# Patient Record
Sex: Female | Born: 1980 | Race: White | Hispanic: No | Marital: Married | Smoking: Current every day smoker
Health system: Southern US, Community
[De-identification: ages and names within clinical notes are randomized; demographics above are authoritative.]

## PROBLEM LIST (undated history)

## (undated) DIAGNOSIS — R87629 Unspecified abnormal cytological findings in specimens from vagina: Secondary | ICD-10-CM

## (undated) DIAGNOSIS — Z22322 Carrier or suspected carrier of Methicillin resistant Staphylococcus aureus: Secondary | ICD-10-CM

## (undated) DIAGNOSIS — J45909 Unspecified asthma, uncomplicated: Secondary | ICD-10-CM

## (undated) DIAGNOSIS — F419 Anxiety disorder, unspecified: Secondary | ICD-10-CM

## (undated) DIAGNOSIS — R569 Unspecified convulsions: Secondary | ICD-10-CM

## (undated) DIAGNOSIS — F32A Depression, unspecified: Secondary | ICD-10-CM

## (undated) DIAGNOSIS — T8859XA Other complications of anesthesia, initial encounter: Secondary | ICD-10-CM

## (undated) DIAGNOSIS — F329 Major depressive disorder, single episode, unspecified: Secondary | ICD-10-CM

## (undated) DIAGNOSIS — G629 Polyneuropathy, unspecified: Secondary | ICD-10-CM

## (undated) DIAGNOSIS — Z9289 Personal history of other medical treatment: Secondary | ICD-10-CM

## (undated) DIAGNOSIS — T4145XA Adverse effect of unspecified anesthetic, initial encounter: Secondary | ICD-10-CM

## (undated) DIAGNOSIS — F319 Bipolar disorder, unspecified: Secondary | ICD-10-CM

## (undated) HISTORY — PX: TONSILLECTOMY: SUR1361

## (undated) HISTORY — DX: Anxiety disorder, unspecified: F41.9

## (undated) HISTORY — PX: TUBAL LIGATION: SHX77

## (undated) HISTORY — PX: ADENOIDECTOMY: SUR15

## (undated) HISTORY — PX: ORIF PELVIC FRACTURE: SHX2128

## (undated) HISTORY — DX: Unspecified abnormal cytological findings in specimens from vagina: R87.629

---
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
## 2002-01-26 ENCOUNTER — Inpatient Hospital Stay (HOSPITAL_COMMUNITY): Admission: AD | Admit: 2002-01-26 | Discharge: 2002-01-28 | Payer: Self-pay | Admitting: Obstetrics

## 2002-02-16 ENCOUNTER — Inpatient Hospital Stay (HOSPITAL_COMMUNITY): Admission: AD | Admit: 2002-02-16 | Discharge: 2002-02-16 | Payer: Self-pay | Admitting: Obstetrics & Gynecology

## 2002-03-19 ENCOUNTER — Ambulatory Visit (HOSPITAL_COMMUNITY): Admission: RE | Admit: 2002-03-19 | Discharge: 2002-03-19 | Payer: Self-pay | Admitting: Obstetrics

## 2002-03-19 ENCOUNTER — Encounter: Payer: Self-pay | Admitting: Obstetrics

## 2002-05-13 ENCOUNTER — Encounter: Payer: Self-pay | Admitting: Obstetrics

## 2002-05-13 ENCOUNTER — Ambulatory Visit (HOSPITAL_COMMUNITY): Admission: RE | Admit: 2002-05-13 | Discharge: 2002-05-13 | Payer: Self-pay | Admitting: Obstetrics

## 2002-06-12 ENCOUNTER — Ambulatory Visit (HOSPITAL_COMMUNITY): Admission: RE | Admit: 2002-06-12 | Discharge: 2002-06-12 | Payer: Self-pay | Admitting: Obstetrics

## 2002-06-12 ENCOUNTER — Encounter: Payer: Self-pay | Admitting: Obstetrics

## 2002-07-03 ENCOUNTER — Inpatient Hospital Stay (HOSPITAL_COMMUNITY): Admission: AD | Admit: 2002-07-03 | Discharge: 2002-07-03 | Payer: Self-pay | Admitting: Obstetrics

## 2002-07-16 ENCOUNTER — Encounter: Payer: Self-pay | Admitting: Obstetrics

## 2002-07-16 ENCOUNTER — Ambulatory Visit (HOSPITAL_COMMUNITY): Admission: RE | Admit: 2002-07-16 | Discharge: 2002-07-16 | Payer: Self-pay | Admitting: Obstetrics

## 2002-07-25 ENCOUNTER — Inpatient Hospital Stay (HOSPITAL_COMMUNITY): Admission: AD | Admit: 2002-07-25 | Discharge: 2002-07-25 | Payer: Self-pay | Admitting: Obstetrics & Gynecology

## 2002-07-26 ENCOUNTER — Inpatient Hospital Stay (HOSPITAL_COMMUNITY): Admission: AD | Admit: 2002-07-26 | Discharge: 2002-07-26 | Payer: Self-pay | Admitting: Obstetrics

## 2002-08-03 ENCOUNTER — Encounter (INDEPENDENT_AMBULATORY_CARE_PROVIDER_SITE_OTHER): Payer: Self-pay

## 2002-08-03 ENCOUNTER — Inpatient Hospital Stay (HOSPITAL_COMMUNITY): Admission: AD | Admit: 2002-08-03 | Discharge: 2002-08-06 | Payer: Self-pay | Admitting: Obstetrics

## 2005-05-13 ENCOUNTER — Emergency Department (HOSPITAL_COMMUNITY): Admission: EM | Admit: 2005-05-13 | Discharge: 2005-05-13 | Payer: Self-pay | Admitting: Family Medicine

## 2005-09-28 ENCOUNTER — Inpatient Hospital Stay (HOSPITAL_COMMUNITY): Admission: AD | Admit: 2005-09-28 | Discharge: 2005-09-28 | Payer: Self-pay | Admitting: Obstetrics & Gynecology

## 2005-09-30 ENCOUNTER — Inpatient Hospital Stay (HOSPITAL_COMMUNITY): Admission: AD | Admit: 2005-09-30 | Discharge: 2005-10-07 | Payer: Self-pay | Admitting: Obstetrics & Gynecology

## 2005-10-16 ENCOUNTER — Inpatient Hospital Stay (HOSPITAL_COMMUNITY): Admission: AD | Admit: 2005-10-16 | Discharge: 2005-10-16 | Payer: Self-pay | Admitting: Maternal and Fetal Medicine

## 2005-10-17 ENCOUNTER — Inpatient Hospital Stay (HOSPITAL_COMMUNITY): Admission: AD | Admit: 2005-10-17 | Discharge: 2005-10-17 | Payer: Self-pay | Admitting: Obstetrics & Gynecology

## 2005-12-01 ENCOUNTER — Inpatient Hospital Stay (HOSPITAL_COMMUNITY): Admission: AD | Admit: 2005-12-01 | Discharge: 2005-12-02 | Payer: Self-pay | Admitting: Obstetrics & Gynecology

## 2006-01-04 ENCOUNTER — Ambulatory Visit (HOSPITAL_COMMUNITY): Admission: RE | Admit: 2006-01-04 | Discharge: 2006-01-04 | Payer: Self-pay | Admitting: Obstetrics

## 2006-02-08 ENCOUNTER — Inpatient Hospital Stay (HOSPITAL_COMMUNITY): Admission: AD | Admit: 2006-02-08 | Discharge: 2006-02-08 | Payer: Self-pay | Admitting: Obstetrics

## 2006-02-12 ENCOUNTER — Inpatient Hospital Stay (HOSPITAL_COMMUNITY): Admission: AD | Admit: 2006-02-12 | Discharge: 2006-02-23 | Payer: Self-pay | Admitting: Obstetrics

## 2006-02-13 ENCOUNTER — Ambulatory Visit: Payer: Self-pay | Admitting: Neonatology

## 2006-02-23 ENCOUNTER — Inpatient Hospital Stay (HOSPITAL_COMMUNITY): Admission: AD | Admit: 2006-02-23 | Discharge: 2006-02-23 | Payer: Self-pay | Admitting: Obstetrics

## 2006-02-25 ENCOUNTER — Inpatient Hospital Stay (HOSPITAL_COMMUNITY): Admission: AD | Admit: 2006-02-25 | Discharge: 2006-02-27 | Payer: Self-pay | Admitting: Obstetrics

## 2006-04-12 ENCOUNTER — Ambulatory Visit (HOSPITAL_COMMUNITY): Admission: RE | Admit: 2006-04-12 | Discharge: 2006-04-12 | Payer: Self-pay | Admitting: Obstetrics

## 2006-04-21 ENCOUNTER — Inpatient Hospital Stay (HOSPITAL_COMMUNITY): Admission: AD | Admit: 2006-04-21 | Discharge: 2006-04-21 | Payer: Self-pay | Admitting: Obstetrics

## 2006-04-27 ENCOUNTER — Inpatient Hospital Stay (HOSPITAL_COMMUNITY): Admission: AD | Admit: 2006-04-27 | Discharge: 2006-04-27 | Payer: Self-pay | Admitting: Obstetrics

## 2006-04-29 ENCOUNTER — Inpatient Hospital Stay (HOSPITAL_COMMUNITY): Admission: AD | Admit: 2006-04-29 | Discharge: 2006-05-02 | Payer: Self-pay | Admitting: Obstetrics

## 2006-05-01 ENCOUNTER — Encounter (INDEPENDENT_AMBULATORY_CARE_PROVIDER_SITE_OTHER): Payer: Self-pay | Admitting: Specialist

## 2006-08-05 ENCOUNTER — Emergency Department (HOSPITAL_COMMUNITY): Admission: EM | Admit: 2006-08-05 | Discharge: 2006-08-05 | Payer: Self-pay | Admitting: Family Medicine

## 2006-12-12 ENCOUNTER — Inpatient Hospital Stay (HOSPITAL_COMMUNITY): Admission: AD | Admit: 2006-12-12 | Discharge: 2006-12-12 | Payer: Self-pay | Admitting: Obstetrics & Gynecology

## 2006-12-19 ENCOUNTER — Emergency Department (HOSPITAL_COMMUNITY): Admission: EM | Admit: 2006-12-19 | Discharge: 2006-12-19 | Payer: Self-pay | Admitting: Emergency Medicine

## 2007-03-13 ENCOUNTER — Emergency Department (HOSPITAL_COMMUNITY): Admission: EM | Admit: 2007-03-13 | Discharge: 2007-03-13 | Payer: Self-pay | Admitting: Family Medicine

## 2007-03-26 ENCOUNTER — Inpatient Hospital Stay (HOSPITAL_COMMUNITY): Admission: AD | Admit: 2007-03-26 | Discharge: 2007-03-26 | Payer: Self-pay | Admitting: Obstetrics & Gynecology

## 2007-04-24 ENCOUNTER — Emergency Department (HOSPITAL_COMMUNITY): Admission: EM | Admit: 2007-04-24 | Discharge: 2007-04-24 | Payer: Self-pay | Admitting: Emergency Medicine

## 2007-07-14 ENCOUNTER — Emergency Department (HOSPITAL_COMMUNITY): Admission: EM | Admit: 2007-07-14 | Discharge: 2007-07-15 | Payer: Self-pay | Admitting: Emergency Medicine

## 2007-08-15 ENCOUNTER — Emergency Department (HOSPITAL_COMMUNITY): Admission: EM | Admit: 2007-08-15 | Discharge: 2007-08-15 | Payer: Self-pay | Admitting: Emergency Medicine

## 2008-02-10 ENCOUNTER — Emergency Department (HOSPITAL_COMMUNITY): Admission: EM | Admit: 2008-02-10 | Discharge: 2008-02-10 | Payer: Self-pay | Admitting: Emergency Medicine

## 2008-02-14 ENCOUNTER — Emergency Department (HOSPITAL_COMMUNITY): Admission: EM | Admit: 2008-02-14 | Discharge: 2008-02-14 | Payer: Self-pay | Admitting: Emergency Medicine

## 2008-02-24 ENCOUNTER — Emergency Department (HOSPITAL_COMMUNITY): Admission: EM | Admit: 2008-02-24 | Discharge: 2008-02-24 | Payer: Self-pay | Admitting: Emergency Medicine

## 2008-03-05 ENCOUNTER — Emergency Department (HOSPITAL_COMMUNITY): Admission: EM | Admit: 2008-03-05 | Discharge: 2008-03-05 | Payer: Self-pay | Admitting: Emergency Medicine

## 2008-12-07 ENCOUNTER — Emergency Department (HOSPITAL_COMMUNITY): Admission: EM | Admit: 2008-12-07 | Discharge: 2008-12-07 | Payer: Self-pay | Admitting: Emergency Medicine

## 2009-04-29 ENCOUNTER — Emergency Department (HOSPITAL_COMMUNITY): Admission: EM | Admit: 2009-04-29 | Discharge: 2009-04-29 | Payer: Self-pay | Admitting: Emergency Medicine

## 2010-01-16 ENCOUNTER — Emergency Department (HOSPITAL_COMMUNITY): Admission: EM | Admit: 2010-01-16 | Discharge: 2010-01-17 | Payer: Self-pay | Admitting: Emergency Medicine

## 2010-01-16 ENCOUNTER — Emergency Department (HOSPITAL_COMMUNITY): Admission: EM | Admit: 2010-01-16 | Discharge: 2010-01-16 | Payer: Self-pay | Admitting: Family Medicine

## 2010-03-29 ENCOUNTER — Emergency Department (HOSPITAL_COMMUNITY)
Admission: EM | Admit: 2010-03-29 | Discharge: 2010-03-29 | Payer: Self-pay | Source: Home / Self Care | Admitting: Emergency Medicine

## 2010-04-27 ENCOUNTER — Emergency Department (HOSPITAL_COMMUNITY)
Admission: EM | Admit: 2010-04-27 | Discharge: 2010-04-27 | Payer: Medicaid Other | Source: Home / Self Care | Admitting: Family Medicine

## 2010-07-06 LAB — CBC
MCH: 29.9 pg (ref 26.0–34.0)
MCV: 90.6 fL (ref 78.0–100.0)
Platelets: 258 10*3/uL (ref 150–400)
RBC: 4.25 MIL/uL (ref 3.87–5.11)
RDW: 13.1 % (ref 11.5–15.5)

## 2010-07-06 LAB — BASIC METABOLIC PANEL
BUN: 8 mg/dL (ref 6–23)
CO2: 28 mEq/L (ref 19–32)
Calcium: 8.4 mg/dL (ref 8.4–10.5)
Chloride: 107 mEq/L (ref 96–112)
Creatinine, Ser: 0.62 mg/dL (ref 0.4–1.2)
GFR calc Af Amer: >60 mL/min (ref >60)

## 2010-07-06 LAB — DIFFERENTIAL
Basophils Absolute: 0 10*3/uL (ref 0.0–0.1)
Basophils Relative: 0 % (ref 0–1)
Eosinophils Absolute: 0.3 10*3/uL (ref 0.0–0.7)
Eosinophils Relative: 3 % (ref 0–5)
Lymphs Abs: 2.1 10*3/uL (ref 0.7–4.0)
Neutrophils Relative %: 64 % (ref 43–77)

## 2010-07-06 LAB — HEMOCCULT GUIAC POC 1CARD (OFFICE): Fecal Occult Bld: POSITIVE

## 2010-07-08 LAB — COMPREHENSIVE METABOLIC PANEL
AST: 20 U/L (ref 0–37)
Albumin: 3.6 g/dL (ref 3.5–5.2)
Alkaline Phosphatase: 37 U/L — ABNORMAL LOW (ref 39–117)
BUN: 12 mg/dL (ref 6–23)
Chloride: 106 mEq/L (ref 96–112)
GFR calc Af Amer: >60 mL/min (ref >60)
Potassium: 4.4 mEq/L (ref 3.5–5.1)
Total Protein: 6.7 g/dL (ref 6.0–8.3)

## 2010-07-08 LAB — POCT URINALYSIS DIPSTICK
Bilirubin Urine: NEGATIVE
Glucose, UA: NEGATIVE mg/dL
Hgb urine dipstick: NEGATIVE
Nitrite: NEGATIVE
Urobilinogen, UA: 1 mg/dL (ref 0.0–1.0)

## 2010-07-08 LAB — POCT PREGNANCY, URINE: Preg Test, Ur: NEGATIVE

## 2010-07-08 LAB — CBC
MCV: 89.5 fL (ref 78.0–100.0)
Platelets: 196 10*3/uL (ref 150–400)
RBC: 4.1 MIL/uL (ref 3.87–5.11)
RDW: 13.5 % (ref 11.5–15.5)
WBC: 9.6 10*3/uL (ref 4.0–10.5)

## 2010-07-08 LAB — DIFFERENTIAL
Basophils Absolute: 0 10*3/uL (ref 0.0–0.1)
Basophils Relative: 0 % (ref 0–1)
Lymphocytes Relative: 25 % (ref 12–46)
Monocytes Relative: 6 % (ref 3–12)
Neutro Abs: 6.2 10*3/uL (ref 1.7–7.7)
Neutrophils Relative %: 65 % (ref 43–77)

## 2010-07-11 LAB — URINALYSIS, ROUTINE W REFLEX MICROSCOPIC
Nitrite: NEGATIVE
Specific Gravity, Urine: 1.034 — ABNORMAL HIGH (ref 1.005–1.030)
Urobilinogen, UA: 1 mg/dL (ref 0.0–1.0)
pH: 5.5 (ref 5.0–8.0)

## 2010-07-11 LAB — DIFFERENTIAL
Eosinophils Relative: 1 % (ref 0–5)
Lymphocytes Relative: 10 % — ABNORMAL LOW (ref 12–46)
Lymphs Abs: 0.6 10*3/uL — ABNORMAL LOW (ref 0.7–4.0)
Monocytes Absolute: 0.7 10*3/uL (ref 0.1–1.0)
Monocytes Relative: 12 % (ref 3–12)

## 2010-07-11 LAB — WET PREP, GENITAL: WBC, Wet Prep HPF POC: NONE SEEN

## 2010-07-11 LAB — URINE MICROSCOPIC-ADD ON

## 2010-07-11 LAB — POCT I-STAT, CHEM 8
BUN: 22 mg/dL (ref 6–23)
Chloride: 106 mEq/L (ref 96–112)
Creatinine, Ser: 0.8 mg/dL (ref 0.4–1.2)
Hemoglobin: 13.3 g/dL (ref 12.0–15.0)
Potassium: 3.6 mEq/L (ref 3.5–5.1)
Sodium: 139 mEq/L (ref 135–145)

## 2010-07-11 LAB — CBC
HCT: 35.5 % — ABNORMAL LOW (ref 36.0–46.0)
Hemoglobin: 12.3 g/dL (ref 12.0–15.0)
WBC: 6 10*3/uL (ref 4.0–10.5)

## 2010-09-10 NOTE — Discharge Summary (Signed)
NAMEBROOKSIE, ELLWANGER                  ACCOUNT NO.:  [id number]   MEDICAL RECORD NO.:  [id number]          PATIENT TYPE:  INP   LOCATION:  9304                          [facility]  [facility]   PHYSICIAN:  Charles A. Clearance Coots, M.D.DATE OF BIRTH:  07/29/1980   DATE OF ADMISSION:  09/30/2005  DATE OF DISCHARGE:  10/07/2005                                 DISCHARGE SUMMARY   ADMITTING DIAGNOSES:  1. [redacted] weeks gestation.  2. Nausea and vomiting.   DISCHARGE DIAGNOSES:  1. [redacted] weeks gestation.  2. Nausea and vomiting.  3. Discharged home undelivered at approximately [redacted] weeks gestation much      improved.   REASON FOR ADMISSION:  A 30 year old white female para 2 with intrauterine  pregnancy at [redacted] weeks gestation presents complaining of nausea and vomiting.  The patient had presented to the maternity admissions unit several days  prior to admission with similar complaints.  She has a history of pregnancy-  related nausea and vomiting with her prior pregnancies.  The nausea and  vomited had respond in the past to Zofran.   PAST MEDICAL HISTORY:   SURGERY:  Tonsillectomy/adenoidectomy.   ILLNESSES:  None.   MEDICATIONS:  Tylenol.   ALLERGIES:  PENICILLIN and MACROLIDES.   SOCIAL HISTORY:  Negative for tobacco, alcohol, or recreational drug use.  Obstetrical risk factors:  She has a history of preterm delivery and  pregnancy-related nausea and vomiting.   PAST OBSTETRICAL HISTORY:  Status post two spontaneous vaginal deliveries.   PHYSICAL EXAMINATION:  GENERAL:  Slim white female in no acute distress.  VITAL SIGNS:  Temperature 98.4, pulse 72, respirations 20, blood pressure  113/69.  LUNGS:  Clear to auscultation bilaterally.  HEART:  Regular rate and rhythm.  ABDOMEN:  Soft, nontender.  PELVIC:  Deferred.   LABORATORY VALUES:  Urinalysis:  Specific gravity 1.030, cloudy, 40 of  ketones, small bilirubin.  Electrolytes normal.  Hemoglobin 12.6, white  blood cell count 9600.   IMPRESSION:   [redacted] weeks gestation with pregnancy-related nausea and vomiting.  No significant metabolic disturbance.   PLAN:  Admit, IV hydration and anti-emetic therapy.   HOSPITAL COURSE:  Patient was admitted and started on IV fluids and anti-  emetic therapy with Zofran and Reglan.  She responded well to therapy after  about three days of therapy and was able to eat after day three and the  response was improved with a steroid taper.  She was discharged home on  hospital day #7 able to tolerate a diet.   DISCHARGE LABORATORY VALUES:  Hemoglobin 11.3, hematocrit 32, white blood  cell count 7000, platelets 216,000.  Comprehensive metabolic panel was  within normal limits.   DISCHARGE DISPOSITION:   MEDICATIONS:  Continue Zofran and Reglan and a Medrol Dosepak.  Patient is  to call office for a follow-up appointment in one week.  Patient was given  instructions for nausea and vomiting in pregnancy.      Charles A. Clearance Coots, M.D.  Electronically Signed     CAH/MEDQ  D:  11/28/2005  T:  11/28/2005  Job:  [id number]

## 2010-09-10 NOTE — Discharge Summary (Signed)
   NAME:  [name], [name]                          ACCOUNT NO.:  [id number]   MEDICAL RECORD NO.:  [id number]                   PATIENT TYPE:  INP   LOCATION:  9326                                 [facility]   PHYSICIAN:  Roseanna Rainbow, M.D.         DATE OF BIRTH:  [birth year]   DATE OF ADMISSION:  01/25/2002  DATE OF DISCHARGE:  01/28/2002                                 DISCHARGE SUMMARY   CHIEF COMPLAINT:  The patient is a 30 year old gravida 2 para 1, last  menstrual period September 24, 2001 who complains of severe headache, nausea and  vomiting, and back pain.   HISTORY OF PRESENT ILLNESS:  As above.  The patient has a history of  migraine headaches.   ALLERGIES:  AUGMENTIN and ERYTHROMYCIN.   MEDICATIONS:  Albuterol as needed.   PAST SURGICAL HISTORY:  1. Tonsil and adenoid.  2. Left ear surgery.   PAST OBSTETRICAL AND GYNECOLOGICAL HISTORY:  She is status post a  spontaneous vaginal delivery.   PAST MEDICAL HISTORY:  Asthma.   PHYSICAL EXAMINATION:  VITAL SIGNS:  Temperature 99.2, pulse 108,  respiratory rate 20, blood pressure 117/62.  GENERAL APPEARANCE:  Well-developed, well-nourished, in no apparent  distress.  HEENT:  Normocephalic, atraumatic.  NECK:  Supple.  No adenopathy.  LUNGS:  Clear to auscultation bilaterally.  HEART:  Regular rate and rhythm.  ABDOMEN:  Soft, nontender.  PELVIC:  Deferred.  EXTREMITIES:  No clubbing, cyanosis, or edema.  SKIN:  Without rash.   ASSESSMENT:  Intrauterine pregnancy at 17+ weeks with migraine headache,  unable to tolerate p.o.   PLAN:  Admission and supportive care.   HOSPITAL COURSE:  The patient was admitted, received intravenous hydration,  antiemetics, as well as analgesics for her headache.  Her headache improved  and she was discharged to home tolerating a regular diet.   DISCHARGE DIAGNOSES:  1. Possible viral syndrome.  2. Migraine headache, acute exacerbation.  3. Intrauterine pregnancy at 17+  weeks.   CONDITION:  Good.   DIET:  Regular.   ACTIVITY:  Ad lib.   MEDICATIONS:  1. Resume home medications.  2. Percocet one to two tablets p.o. q.4-6h. as needed.   DISPOSITION:  The patient was to call the office for a follow-up appointment  with Dr. Clearance Coots.                                               Roseanna Rainbow, M.D.    Judee Clara  D:  03/08/2002  T:  03/08/2002  Job:  [id number]

## 2010-09-10 NOTE — H&P (Signed)
[name], [name]                  ACCOUNT NO.:  [id number]   MEDICAL RECORD NO.:  [id number]          PATIENT TYPE:  INP   LOCATION:  9305                          [facility]   PHYSICIAN:  Roseanna Rainbow, M.D.DATE OF BIRTH:  [birth year]   DATE OF ADMISSION:  12/01/2005  DATE OF DISCHARGE:                                HISTORY & PHYSICAL   CHIEF COMPLAINT:  The patient is a 30 year old gravida 3, para 2 with an  estimated date of confinement by ultrasound of May 24, 2006 with an  intrauterine pregnancy at 15 weeks, complaining of nausea and vomiting.   HISTORY OF PRESENT ILLNESS:  The patient has a history of pregnancy-related  nausea and vomiting.   ALLERGIES:  SUDAFED AND MACROLIDE ANTIBIOTICS.   MEDICATIONS:  Zofran.   CURRENT OBSTETRICAL RISK FACTORS:  Pregnancy-related nausea and vomiting.  Asthma, depression.   PAST GYNECOLOGICAL HISTORY:  Noncontributory.   PAST MEDICAL HISTORY:  1. Asthma.  2. Depression.  3. Chronic urinary tract infections.   PAST SURGICAL HISTORY:  Tonsils and adenoids.   SOCIAL HISTORY:  She is unemployed, single. Does not give any significant  history of alcohol usage. Has no significant smoking history. Denies illicit  drug use.   FAMILY HISTORY:  Breast cancer, lung cancer, pancreatic cancer, diabetes  mellitus.   PAST OBSTETRICAL HISTORY:  In 2004, she was delivered of a female 8 pounds 9  ounces, vaginal delivery. In 2003, she was delivered of a female post dates, 8  pounds, vaginal delivery.   PHYSICAL EXAMINATION:  VITAL SIGNS:  Stable. Afebrile. Fetal heart tones by  Doppler okay.  GENERAL:  No apparent distress.  ABDOMEN:  Gravid, nontender.  PELVIC:  Deferred.   STUDIES:  Urinalysis remarkable for greater than 80 ketones.   ASSESSMENT:  1. Hyperemesis gravidarum.  2. Intrauterine pregnancy at 15+ weeks.  3. Dehydration.   PLAN:  Admission. Supportive care including antiemetics. IV hydration.  Serial lab work.  Nutrition consult. Bed rest.      Roseanna Rainbow, M.D.  Electronically Signed     LAJ/MEDQ  D:  12/02/2005  T:  12/02/2005  Job:  [id number]

## 2010-09-10 NOTE — Discharge Summary (Signed)
[name], [name]                  ACCOUNT NO.:  [id number]   MEDICAL RECORD NO.:  [id number]          PATIENT TYPE:  INP   LOCATION:  9159                          [facility]   PHYSICIAN:  Roseanna Rainbow, M.D.DATE OF BIRTH:  1981/02/11   DATE OF ADMISSION:  02/25/2006  DATE OF DISCHARGE:  02/27/2006                                 DISCHARGE SUMMARY   CHIEF COMPLAINT:  The patient is a 30 year old gravida 3, para 2 Caucasian  female with an estimated date of confinement of January30,2008 who presents  complaining of nausea and myalgias.   HISTORY OF PRESENT ILLNESS:  Please see the above.  She was recently  discharged to home from a prior hospitalization on November1, 2007.   PAST SURGICAL HISTORY:  1. Tonsils and adenoids.  2. Ear surgery.   PAST MEDICAL HISTORY:  Depression.   MEDICATIONS:  Prenatal vitamins, Wellbutrin, Zofran.   ALLERGIES:  1. ERYTHROMYCIN.  2. ZITHROMAX.  3. SUDAFED.   SOCIAL HISTORY:  She denies any tobacco, ethanol or drug use.   PHYSICAL EXAM:  VITAL SIGNS:  Stable, afebrile, blood pressure 138/88.  LUNGS:  Clear to auscultation.  HEART:  Regular rate and rhythm.  ABDOMEN:  Gravid, nontender.  PELVIC EXAM:  Deferred.  Fetal heart tracing reassuring.  Tocodynamometer  with occasional uterine contractions.   LABS:  Remarkable for potassium of 2.9.  ALT and AST 47 and 30 respectively.  Urinalysis with a specific gravity of 10/20.  White blood cell count 7000,  hemoglobin 10, platelets 520,000.   ASSESSMENT:  1. Intrauterine pregnancy at 27+ weeks.  2. Viral syndrome.  3. Hypokalemia.   PLAN:  Admission, replete potassium, nutrition consult.   HOSPITAL COURSE:  The patient was admitted.  Dr. Abbey Chatters was consulted  for possible acute cholecystitis.  A CT scan was recommended.  The patient's  abdominal pain and flank pain, however, gradually improved.  On November4,  2007, her potassium was 3.6 and her SGOT and SGPT were stable at  32 and 25.  A white blood cell count again was 7000; a hemoglobin was 9.  She was given  magnesium sulfate parenterally for possible pregnancy-induced hypertension.  However, her blood pressures remained in the normotensive range.  The  magnesium sulfate was subsequently discontinued.  It was also felt that she  had a migraine headache.  On November3, 2007, Dr. Ander Slade from the [facility] was consulted as well.   ASSESSMENT:  1. Viral syndrome versus hyperemesis gravidarum with metabolic      disturbance.  2. Myalgias associated with a viral syndrome.  3. Migraine headache, resolved.   CONDITION:  Stable.   DISPOSITION:  The patient was to follow up in the office in one week.   MEDICATIONS:  Resume home medications.      Roseanna Rainbow, M.D.  Electronically Signed     LAJ/MEDQ  D:  03/12/2006  T:  03/12/2006  Job:  [id number]   cc:   Toma Copier, MD   Adolph Pollack, M.D.  [address], [address]  [place]  [place] [address]

## 2010-09-10 NOTE — Consult Note (Signed)
NAMEMARGARETH, [name] Townsend                  ACCOUNT NO.:  [id number]   MEDICAL RECORD NO.:  [id number]          PATIENT TYPE:  INP   LOCATION:  9171                          [facility]   PHYSICIAN:  Adolph Pollack, M.D.DATE OF BIRTH:  07-27-1980   DATE OF CONSULTATION:  DATE OF DISCHARGE:                                   CONSULTATION   REASON:  Abdominal pain, nausea, vomiting.   HISTORY OF PRESENT ILLNESS:  This is a 30 year old female who was recently  discharged.  She had had diffuse abdominal pain, nausea, vomiting, fever,  and chills and had been in the hospital for some time.  She underwent an  abdominal ultrasound on October 23rd demonstrating minimal gallbladder wall  thickening with sludge and normal common bile duct.  This was not consistent  with acute cholecystitis.  She was sent home and readmitted fairly soon  after discharge with what she describes as severe, sharp, and twisting  abdominal pain.  Sometimes it starts in the back and radiates around the  left side.  Other times it may start in the lower abdomen or left side and  radiate to the back.  She subsequently underwent a repeat abdominal  ultrasound.  Apparently, there is a sonographic Murphy sign at the time and  the findings were suspicious for a possible acute cholecystitis.  She had  sludge and, again, minimal gallbladder wall thickening, essentially the same  ultrasound as she had before.  It was for this reason that I was asked to  see her.   PAST MEDICAL HISTORY:  1. Hyperemesis gravidarum with previous 2 pregnancies.  2. Constipation.  3. Asthma.   Her previous abdominal operations, none.   ALLERGIES:  1. ERYTHROMYCIN.  2. ZITHROMAX.  3. SUDAFED.   CURRENT MEDICATIONS:  Include:  1. Milk of Magnesia.  2. Colace.  3. Flexeril.  4. IV analgesics.   SOCIAL HISTORY:  This is her third pregnancy.  She is single.  Denies  tobacco or alcohol use.   REVIEW OF SYSTEMS:  CONSTITUTIONAL:  Generally,  no fever or chills with this  acute episode.  GI:  She has had no diarrhea and has problems with  constipation with her last bowel movement being 2 days ago.  GU:  No  dysuria.   PHYSICAL EXAM:  GENERAL:  A well-developed, well-nourished female.  She is  in no acute distress.  Her temperature is 98.9. Blood pressure is 127/89.  Pulse of 108.  Respiratory rate 18.  EYES:  Extraocular motion is intact.  No icterus.  RESPIRATORY:  The breath sounds are equal and clear. Respirations unlabored.  CARDIOVASCULAR:  Heart tones demonstrate a slightly increased rate with a  regular rhythm.  No JVD.  ABDOMEN:  Soft and gravid with a palpable uterus above the umbilicus.  There  is no significant right upper quadrant tenderness.  No guarding.  No  Murphy's sign.  No right lower quadrant tenderness.  There is a moderate  amount of left mid abdominal tenderness and a very epigastric tenderness.  Hypoactive bowel sounds noted.  No  CVA tenderness.   LABORATORY DATA:  White blood cell count is 7100 with no left shift.  Amylase, lipase normal.  Total bilirubin slightly elevated at 1.3.  SGOT is  slightly elevated at 47.  Ultrasounds from today and the 23rd were reviewed  with Dr. Molli Posey.   IMPRESSION:  Abdominal and back pain with nausea and vomiting - clinical  history and physical examination are not consistent with acute  cholecystitis.  The ultrasound is essentially equivocal and not very  convincing either.   PLAN:  I agree with the recommendation by the perinatologist, Dr. Ander Slade, that  they could obtain a CT scan.  I currently see no indication for a general  surgical procedure.  If anything shows up on the CT scan, we would be glad  to see her again in the future.      Adolph Pollack, M.D.  Electronically Signed     TJR/MEDQ  D:  02/25/2006  T:  02/26/2006  Job:  [id number]   cc:   Leonette Most A. Clearance Coots, M.D.  Fax: [phone]

## 2010-09-10 NOTE — Op Note (Signed)
[place]. [facility]  Patient:    [name], [name]                         MRN: [id number] Proc. Date: 11/24/99 Adm. Date:  [id number] Disc. Date: [id number] Attending:  Aundria Mems                           Operative Report  PREOPERATIVE DIAGNOSES: 1. Foreign body left middle ear space. 2. Hyperplastic chronic adenotonsillitis.  OPERATIVE PROCEDURE: 1. Removal of foreign body left middle ear space. 2. Adenotonsillectomy.  POSTOPERATIVE DIAGNOSES: 1. Foreign body left middle ear space. 2. Hyperplastic chronic adenotonsillitis.  DESCRIPTION OF PROCEDURE:  With the patient under general orotracheal anesthesia and visualization through the operating microscope, the left ear was inspected.  An old ventilating tube was visible in the middle ear space behind the anterior superior aspect of the tympanic membrane.  A radial incision of the tympanic membrane overlying the tube in the middle ear space was made, and the tube was grasped with forceps and readily removed through the incision in the tympanic membrane which was left to heal spontaneously.  Crowe-Davis mouthgag was then inserted, and the patient put in the [place] position.  Examination showed the patient to have 2 to 3+ enlarged tonsils, but appeared to be extensively enlarged into the fossae bilaterally.  There was large amounts of debris on the surface of the tonsils, but no acute inflammation.  Red rubber catheter was passed through the left nasal chamber, having noted that the soft palate was normal in appearance, and the hard palate was normal to palpation.  Both tonsils likewise were nonpulsitile to palpation.  Nasopharyngeal mirror examination revealed prominent adenoid tissue coated with debris, as well as the presence of a very tenatious thick mucousy secretions trapped in the lateral margins between the tonsils and lateral nasopharyngeal wall bilaterally.  It came away tenatiously  with suction.  The adenoids were then removed by curretage and packs were placed for hemostasis.  The left tonsil was grasped at the superior pole and removed by electrical dissection.  Hemostasis kept complete with electrocauterization.  The right tonsil was removed in a similar fashion, noting at above that both tonsils were deeply enlarged and invasive into the tonsillar fossae bilaterally.  There was prominent lingual tissue as well.  Packs were removed from nasopharynx, and under mirror visualization with suction cautery, complete ablation of the remaining adenoid tissue as well as obtaining complete hemostasis of the nasopharyngeal adenoidectomy site was completed.  ESTIMATED BLOOD LOSS:  Approximately 100 cc.  The patient tolerated the procedure well, was taken to the recovery room in stable general condition. DD:  11/24/99 TD:  11/25/99 Job: [id number]

## 2010-09-10 NOTE — Discharge Summary (Signed)
   NAME:  [name], [name]                          ACCOUNT NO.:  [id number]   MEDICAL RECORD NO.:  [id number]                   PATIENT TYPE:   LOCATION:                                       [facility]   PHYSICIAN:  Roseanna Rainbow, M.D.         DATE OF BIRTH:  12/05/1980   DATE OF ADMISSION:  12/20/2001  DATE OF DISCHARGE:  12/22/2001                                 DISCHARGE SUMMARY   CHIEF COMPLAINT:  The patient is a 30 year old gravida 2 para 1 with nausea  and vomiting.   HISTORY OF PRESENT ILLNESS:  The patient describes several weeks of nausea  and vomiting.  She has not been able to tolerate any solid p.o. intake for  one week.  She has a history of hyperemesis with a previous pregnancy  requiring Zofran.   ALLERGIES:  ZITHROMAX, ERYTHROMYCIN, SUDAFED, and AUGMENTIN.   MEDICATIONS:  Phenergan suppositories.   PAST SURGICAL HISTORY:  Myringotomy, T&A, and left ear surgery.   PAST MEDICAL HISTORY:  Asthma and depression.   PAST OBSTETRICAL AND GYNECOLOGICAL HISTORY:  As above.  She is status post  one NSVD.   FAMILY HISTORY:  Noncontributory.   PHYSICAL EXAMINATION:  VITAL SIGNS:  Stable, afebrile.  GENERAL APPEARANCE:  Well-nourished, well-developed female.  HEENT:  Normocephalic, atraumatic.  NECK:  Supple.  LUNGS:  Clear to auscultation bilaterally.  HEART:  Regular rate and rhythm.  ABDOMEN:  Soft, nontender.  PELVIC:  Deferred.  EXTREMITIES:  No clubbing, cyanosis, or edema.  SKIN:  Without rash.   LABORATORY DATA:  Ultrasound from December 18, 2001 demonstrated a six week  six day intrauterine pregnancy with an [facility] of August 07, 2002.   IMPRESSION:  Seven weeks one day intrauterine pregnancy with nausea and  vomiting of pregnancy.   PLAN:  Admission, intravenous hydration, antiemetics, supportive care.   HOSPITAL COURSE:  The patient was admitted and was started on Zofran and was  given intravenous hydration as well as multivitamins.  Her nausea  improved  and her diet was gradually advanced.  An H. pylori antibody test was  equivocal.  She was able to tolerate a regular diet.   DISCHARGE DIAGNOSIS:  Nausea and vomiting of pregnancy.   CONDITION:  Stable.   DIET:  Regular.   ACTIVITY:  Ad lib.   MEDICATIONS:  1. Reglan 10 mg p.o. t.i.d.  2. Unisom q.h.s.   DISPOSITION:  The patient was to call the office to arrange follow-up with  Dr. Clearance Coots.                                               Roseanna Rainbow, M.D.    [name]  D:  03/08/2002  T:  03/08/2002  Job:  [id number]

## 2010-09-10 NOTE — H&P (Signed)
[facility]  Patient:    [name], [name]                           MRN: [id number] Adm. Date:  03/24/00 Attending:  Jasmine Pang, M.D.                   Psychiatric Admission Assessment  PATIENT IDENTIFICATION:  This is a 30 year old Caucasian female from [place] who is currently in 12th grade at [employer].  HISTORY OF PRESENT ILLNESS:  Patient has had a history of escalating depression and anxiety since school has begun.  She suffers from school anxiety which worsened last year after she had to be out for strep throat and a resulting tonsillectomy and adenoidectomy.  This year when restarting school, her mood decompensated with feelings of anxiety and multiple neurovegetative symptoms.  She has had difficulty sleeping, anergia, anhedonia, difficulty concentrating, feelings of low self-esteem and worthlessness and suicidal ideation.  She states she has been thinking of harming herself for the past two weeks because she has felt so worthless.  On the day of admission, she took an overdose of several medications and was stabilized in the emergency room and then referred to our unit.  PAST PSYCHIATRIC HISTORY:  Patient has been in counseling several times with somebody in the [employer].  She states she stopped going because "it made me feel worse -- like I had more problems."  Her family physician tried her on Prozac, Zoloft and Paxil but she was unable to tolerate these due to side effects.  She never was able to stay on any medication for long enough to achieve a therapeutic effect.  SUBSTANCE ABUSE HISTORY:  None.  PAST MEDICAL HISTORY:  Recent bronchitis treated with antibiotic.  She also has a history of strep throat and the tonsillectomy and adenoidectomy one year ago.  ALLERGIES:  Patient is allergic to [facility], AUGMENTIN and SUDAFED.  CURRENT MEDICATIONS:  Patient is currently on an antibiotic.  Her mother  is going to bring the name of this to the hospital.  She is also on Alupent inhaler.  SOCIAL HISTORY:  Patient lives with her mother and father.  She has a 7 year old brother who is [place]. She has a boyfriend and states he is supportive.  She is in the 12th grade and makes good grades.  She suffered from school anxiety for several years.  There has been no history of physical or sexual abuse.  FAMILY HISTORY:  There is a family psychiatric history of anxiety and depression.  LEGAL:  There is no history of legal problems.  ADMISSION DIAGNOSES: Axis I:    Major depression, recurrent, severe. Axis II:   Deferred. Axis III:  Bronchitis. Axis IV:   Severe. Axis V:    Global Assessment of Functioning 10.  ASSETS AND STRENGTHS:  Patient is healthy, verbal and has a supportive family. She also is making good grades.  PROBLEMS:  Mood instability with suicidal ideation.  SHORT-TERM TREATMENT GOAL:  Resolution of suicidal ideation.  LONG-TERM TREATMENT GOAL:  Resolution of mood instability and anxiety.  INITIAL PLAN OF CARE:  Begin Celexa 10 mg q.d. x 1 day, then 20 mg q.d.  We will also begin psychotherapy to decrease cognitive distortion and decrease suicidal ideation.  We will discuss alternatives regarding school with her parents regarding school.  ESTIMATED LENGTH OF STAY:  Three to  five days.  CONDITIONS NECESSARY FOR DISCHARGE:  Not suicidal.  POST HOSPITAL CARE PLAN:  Patient will return home to live with her family. Follow-up therapy and medication management will be in [facility] outpatient clinic. DD:  03/25/00 TD:  03/25/00 Job: [id number]

## 2010-09-10 NOTE — Discharge Summary (Signed)
[facility]  Patient:    [name], [name]                         MRN: [id number] Adm. Date:  [id number] Disc. Date: [id number] Attending:  Sela Hua                           Discharge Summary  ADDENDUM:  DISCHARGE MENTAL STATUS EXAMINATION:  Psychomotor retardation was present but improved from admission.  Mood was still depressed and anxious but less than on admission.  Affect:  Able to reveal a wide range and smile appropriately. there was no suicidal ideation, homicidal ideation.  There was no self-injurious behavior or aggression.  There was no psychosis or perceptual disturbances.  Thought processes were logical and goal-directed.  Thought content revealed no predominant theme.  On cognitive exam the patient was alert and oriented x 4.  Short term and long term memory were adequate. General fund of knowledge, age and education level appropriate.  Attention and concentration much improved from admission status, due to lessen ing of anxiety.  Insight was good and judgment was good.  DISCHARGE DIAGNOSES: Axis I:    Major depression, recurrent severe. Axis II:   Deferred. Axis III:  Healthy. Axis IV:   Severe. Axis V;    Global assessment of functioning 60/10.  DISCHARGE MEDICATIONS:  Celexa 20 mg 1/2 pill at bedtime for one week then 1 pill at bedtime.  ACTIVITY LEVEL:  No restrictions.  DIET:  No restrictions.  POST-HOSPITAL CARE PLANS:  Malcolm will return home to live with her family. She will be placed on homebound schooling for at least four week and maybe longer.  Follow-up medication psychiatric treatment will be in my outpatient clinic at the [facility]. DD:  04/04/00 TD:  04/04/00 Job: [id number]

## 2010-09-10 NOTE — Discharge Summary (Signed)
NAMEWESLIE, RASMUS                  ACCOUNT NO.:  [id number]   MEDICAL RECORD NO.:  [id number]          PATIENT TYPE:  INP   LOCATION:  9153                          [facility]   PHYSICIAN:  Charles A. Clearance Coots, M.D.DATE OF BIRTH:  Jul 22, 1980   DATE OF ADMISSION:  02/12/2006  DATE OF DISCHARGE:  02/23/2006                                 DISCHARGE SUMMARY   ADMISSION DIAGNOSES:  1. [redacted] weeks gestation.  2. Pyelonephritis.   DISCHARGE DIAGNOSES:  1. [redacted] weeks gestation.  2. Pyelonephritis.  3. Probable viral syndrome.  4. Discharge undelivered at [redacted] weeks gestation, afebrile, in good      condition.   REASON FOR ADMISSION:  A 30 year old white female gravida 3, para 2,  presents with nausea, vomiting, headache, back pain and fever and has had  recurrent migraines with this pregnancy. The patient has also had a history  of nausea and vomiting earlier in the pregnancy.   PAST MEDICAL HISTORY:  Surgery, tonsils and earlobes. Illnesses; depression,  migraines.   MEDICATIONS:  Prenatal vitamins, Wellbutrin, Zofran.   ALLERGIES:  ZITHROMAX, E-MYCIN.   SOCIAL HISTORY:  Single.  Negative tobacco, alcohol or recreational drug  use.   PHYSICAL EXAM:  GENERAL:  Well-nourished, well-developed female in no acute  distress.  VITAL SIGNS:  Temperature 97.1. Vital signs are stable. Weight 138 pounds.  LUNGS:  Clear to auscultation bilaterally.  HEART:  Regular rate and rhythm.  ABDOMEN:  Gravid, nontender.  Back: Negative CVA tenderness.   LABORATORY DATA:  Urinalysis revealed specific gravity of greater than  1.030, positive nitrites, few epithelial cells, few bacteria, greater than  80 ketones. CBC revealed white blood cell count of 4700, hemoglobin of 10.5,  hematocrit 29.4, platelets 140,000. Differential was significant for  elevated neutrophils of 85%, a decreased lymphocytes 10%. Comprehensive  metabolic panel was significant for sodium of 134, potassium 3.   HOSPITAL COURSE:   The patient was admitted and started on IV antibiotics and  IV fluid hydration and supportive management.  She had complained of some  leaking of fluid and on sterile speculum exam there was some pooling in the  vaginal vault with positive Nitrazine and equivocal ferning. The patient  developed right CVA tenderness after the first 24 hours and had continued to  complain of some leaking of fluid per vagina. The CVA tenderness resolved  after 48 hours but she had increased nausea and vomiting and elevated  temperature. T-max of 102-103. Maternal fetal medicine consultation was made  with [facility] and amniocentesis was  performed which was negative for evidence of chorioamnionitis or any  infection.  Infectious disease was also consulted and a viral workup was  done which was also negative. Urine culture was negative for pathogenic  bacteria. The patient continued to have temperature spikes until hospital  day #9 and the T-max after hospital day #9 was 100.5.  She remained afebrile  after hospital day #9 for greater than 48 hours and except for some mild  nausea and vomiting and muscle aches, was much improved.  The patient was  therefore discharged home on February 23, 2006 for modified bedrest.   DISCHARGE LABORATORY:  White blood cell count 5300, hemoglobin 10.2,  hematocrit 29, platelets 186,000, sodium 136, potassium 3.3, and AST 157,  ALT 54, prealbumin 5.7.   DISCHARGE DISPOSITION:   DISCHARGE MEDICATIONS:  1. Continue prenatal vitamins as tolerated.  2. Zofran as needed for nausea.   The patient is to call office for follow-up in 1 week.      Charles A. Clearance Coots, M.D.  Electronically Signed     CAH/MEDQ  D:  02/23/2006  T:  02/23/2006  Job:  [id number]

## 2010-09-10 NOTE — Discharge Summary (Signed)
NAME:  [name], [name]                          ACCOUNT NO.:  [id number]   MEDICAL RECORD NO.:  [id number]                   PATIENT TYPE:  INP   LOCATION:  9124                                 [facility]   PHYSICIAN:  Charles A. Clearance Coots, M.D.             DATE OF BIRTH:  09-15-1980   DATE OF ADMISSION:  08/03/2002  DATE OF DISCHARGE:  08/06/2002                                 DISCHARGE SUMMARY   ADMISSION DIAGNOSES:  1. [redacted] weeks gestation.  2. Large for gestational age fetus.  3. Favorable cervix.   DISCHARGE DIAGNOSES:  1. [redacted] weeks gestation.  2. Large for gestational age fetus.  3. Favorable cervix.  4. Status post normal spontaneous vaginal delivery, a viable female on 08/04/02     at 1118 Hr.  Apgar's of 9 at one minute, 9 at five minutes.  Weight of 3520 grams, length  of 54.5 cm.  Infant and mother discharged home in good condition.   REASON FOR ADMISSION:  This is a 30 year old white female gravida 2, para 1,  estimated date of confinement 08/07/02.  She presents for induction of labor  for large for gestational age fetus at [redacted] weeks gestation, and a very  favorable cervix.   PRENATAL COURSE:  Was remarkable for fetus with a small cleft lip, nose and  ears normal on ultrasound.   PAST MEDICAL HISTORY:   SURGERIES:  Normal spontaneous vaginal delivery 9/02.   ILLNESSES:  Asthma.   MEDICATIONS:  Prenatal vitamins.   ALLERGIES:  AUGMENTIN, ZITHROMAX, E-MYCIN, SUDAFED.   SOCIAL HISTORY:  Married.  Negative history of tobacco, alcohol or  recreational drug use.   PHYSICAL EXAMINATION:  VITAL SIGNS: Afebrile with vital signs stable.  LUNGS: Clear bilaterally.  HEART: Regular rate and rhythm.  ABDOMEN: Soft, gravid, nontender.  CERVIX: Four cm, 80% effaced, vertex at -2 station.   ADMISSION LABORATORY VALUES:  Hemoglobin 10.6, hematocrit 31.9, white blood  cell count 10,400, platelets 221,000.   HOSPITAL COURSE:  The patient was started on low dose Pitocin and  membranes  were ruptured.  The following morning she progressed quite well to a normal  spontaneous vaginal delivery at 11:18.  There were no antepartum  complications.  Her postpartum course was uncomplicated and she was  discharged home on postpartum day number two.   DISCHARGE LABORATORY VALUES:  Hemoglobin 9, hematocrit 27, white blood count  8900, platelets 195,000.   DISPOSITION:   MEDICATIONS:  Tylox 1-2 p.o. q.4h p.r.n. for pain, Ibuprofen 800 mg q.8h  p.r.n. for pain. Continue prenatal vitamins.  Routine written instructions per booklet was given for obstetrical  discharge.   FOLLOW UP:  In six weeks at the [facility].  Charles A. Clearance Coots, M.D.    CAH/MEDQ  D:  08/06/2002  T:  08/06/2002  Job:  [id number]   cc:   [facility]

## 2010-09-10 NOTE — Discharge Summary (Signed)
[facility]  Patient:    [name], [name]                         MRN: [id number] Adm. Date:  [id number] Disc. Date: 05/16/00 Attending:  Antionette Char                           Discharge Summary  DATE OF BIRTH:                [birth year]  DISCHARGE DIAGNOSES:          1. A 7 week intrauterine pregnancy.                               2. Hyperemesis.                               3. Dehydration.                               4. Dyspepsia.  DISCHARGE MEDICATIONS:        1. Zantac 150 mg p.o. b.i.d.                               2. Zofran 4 mg q.6h. p.r.n. nausea.                               3. IV fluids; D5LR with 50 mg Phenergan/L to be                                  run at 40 cc/hour x 48 hours, then p.r.n.                                  This is to be administered by [facility]                                  Care.  LABORATORIES:                 Urinalysis revealed greater than 80 ketones and 30 protein.  Electrolytes were all within normal limits except for a low total protein of 5.8, low albumin of 2.7, and a low calcium of 8.2.  BUN and creatinine are 3 and 0.5 after hydration.  Blood lipase was normal. Prealbumin 14.4.  HISTORY OF PRESENT ILLNESS:   Ms. [name] is a 30 year old G1, P0 who presented to [facility] with persistent nausea and vomiting.  She had been treated at [facility] three times in the previous week with IV hydration and Phenergan suppositories.  [facility] of [facility].  There she was noted to be dehydrated and was admitted for IV fluid hydration.  HOSPITAL COURSE:              Ms. [name] received IV fluids at a rate of  125 cc/hour with Phenergan 25 mg/L added.  Her nausea persisted but she had no episode of vomiting during her stay.  On day #3 of the admission Zofran 4 mg was added to her medications and she  was able to tolerate a bland diet without vomiting.  On the day of discharge she had increased appetite.  She also complained during her stay of epigastric pain.  She was given Pepcid 20 mg IV b.i.d. and her pain resolved.  She received a nutritional consult and was provided with educational materials for an appropriate diet.  She will be followed by home health after discharge for IV fluid and Phenergan administration as well as assessment for signs and symptoms of dehydration and appropriate intake.  DIET:                         Parke Simmers diet only until nausea resolves, then advance as tolerated.  SPECIAL INSTRUCTIONS:         Ms. [name] should return to the clinic or to the hospital if she is unable to tolerate foods or fluids by mouth.  FOLLOW-UP:                    She should follow-up at [facility] at 1 p.m. February 25. DD:  05/16/00 TD:  05/16/00 Job: [id number]

## 2010-09-10 NOTE — H&P (Signed)
Loretta, Townsend                  ACCOUNT NO.:  [id number]   MEDICAL RECORD NO.:  [id number]          PATIENT TYPE:  INP   LOCATION:  9304                          [facility]   PHYSICIAN:  Roseanna Rainbow, M.D.DATE OF BIRTH:  04-17-1981   DATE OF ADMISSION:  09/30/2005  DATE OF DISCHARGE:                                HISTORY & PHYSICAL   CHIEF COMPLAINT:  The patient is a 30 year old para 2, with an intrauterine  pregnancy at 6+ weeks, complaining of nausea and vomiting.   HISTORY OF PRESENT ILLNESS:  The patient had presented to the maternity  admissions unit several days prior with similar complaints.  She has a  history of pregnancy-related nausea and vomiting with her prior pregnancies.  The nausea and vomiting had responded in the past to Zofran.   ALLERGIES:  PENICILLIN AND MACROLIDES.   MEDICATIONS:  Tylenol.  She has not been taking any antiemetics and her  Medicaid is pending.   SOCIAL HISTORY:  No tobacco, ethanol or drug use.   OB RISK FACTORS:  She has a history of a pre-term delivery and pregnancy-  related nausea and vomiting.   PAST OBSTETRICAL HISTORY:  She is status two spontaneous vaginal deliverers.   PAST SURGICAL HISTORY:  She is status post tonsillectomy and adenoidectomy.   PHYSICAL EXAMINATION:  VITAL SIGNS:  Temperature 98.4 degrees, pulse 72,  respirations 20, blood pressure 113/69.  GENERAL:  Well-developed and well-nourished, in no apparent distress.  ABDOMEN:  Soft, nontender.  PELVIC:  Exam is deferred.   LABORATORY DATA:  Urinalysis:  Specific gravity graded at 1030, cloudy, 40  ketones, small bilirubin.  Electrolytes normal.  Hemoglobin 12.6, white  blood cell count 9600.   ASSESSMENT:  Intrauterine pregnancy at six+ weeks, with pregnancy-related  nausea and vomiting.  No significant metabolic disturbance.   PLAN:  Admission, IV hydration, antiemetics.     Roseanna Rainbow, M.D.  Electronically Signed    LAJ/MEDQ   D:  10/01/2005  T:  10/01/2005  Job:  [id number]

## 2010-09-10 NOTE — Op Note (Signed)
[name], [name]                  ACCOUNT NO.:  [id number]   MEDICAL RECORD NO.:  [id number]          PATIENT TYPE:  INP   LOCATION:  9145                          [facility]   PHYSICIAN:  Charles A. Clearance Coots, M.D.DATE OF BIRTH:  [birth year]   DATE OF PROCEDURE:  05/01/2006  DATE OF DISCHARGE:                               OPERATIVE REPORT   PREOPERATIVE DIAGNOSIS:  Desired sterilization.   POSTOPERATIVE DIAGNOSIS:  Desired sterilization.   PROCEDURE:  Bilateral partial salpingectomy.   SURGEON:  Charles A. Clearance Coots, M.D.   ANESTHESIA:  General.   ESTIMATED BLOOD LOSS:  Negligible.   COMPLICATIONS:  None.   SPECIMENS:  Approximately 2-cm segments of right and left fallopian  tubes.   DESCRIPTION OF PROCEDURE:  The patient was brought to the operating  room, and after satisfactory general endotracheal anesthesia, the  abdomen was prepped and draped in the usual sterile fashion.   A small inferior umbilical incision was made with the scalpel that was  deepened down to the fascia with curved Mayo scissors.  The fascia was  grasped in the midline with Kocher forceps and was cut in between  transversely with curved Mayo scissors.  The fascial incision was  extended to the left and to the right with the curved Mayo scissors.  The peritoneum was grasped with hemostats and was incised with  Metzenbaum scissors.  Right-angle retractors were placed in the  incision.  The right fallopian tube was identified and was grasped with  a Babcock clamp in the isthmic area of the tube.  A knuckle of tube  beneath the clamp was doubly ligated with 0 plain catgut, and the  section of tube above the knot was excised with Metzenbaum scissors and  submitted to pathology for evaluation.  There was no active bleeding  from the tubal stump, and it was therefore placed back in the normal  anatomic position.  The same procedure was performed on the opposite  side without complication.  The abdomen was  then closed as follows.  The  peritoneum and fascia was closed as one with a continuous suture of 2-0  Monocryl.  The skin  was closed with continuous subcuticular suture of 3-0 Monocryl.  A  sterile bandage was applied to the incision closure.   The patient tolerated the procedure well and was transported to the  recovery room in satisfactory condition.      Charles A. Clearance Coots, M.D.  Electronically Signed     CAH/MEDQ  D:  05/01/2006  T:  05/01/2006  Job:  [id number]

## 2011-01-17 LAB — I-STAT 8, (EC8 V) (CONVERTED LAB)
Acid-base deficit: 2
Glucose, Bld: 95
Hemoglobin: 13.9
Potassium: 3.5
Sodium: 140
TCO2: 24

## 2011-01-17 LAB — RAPID URINE DRUG SCREEN, HOSP PERFORMED
Amphetamines: NOT DETECTED
Barbiturates: NOT DETECTED
Benzodiazepines: POSITIVE — AB
Opiates: POSITIVE — AB

## 2011-01-17 LAB — POCT I-STAT CREATININE: Operator id: 294341

## 2011-01-26 LAB — CBC
HCT: 39.5
Hemoglobin: 13.4
MCV: 88.1
Platelets: 309
WBC: 10.3

## 2011-01-26 LAB — URINALYSIS, ROUTINE W REFLEX MICROSCOPIC
Bilirubin Urine: NEGATIVE
Leukocytes, UA: NEGATIVE
Nitrite: NEGATIVE
Specific Gravity, Urine: 1.031 — ABNORMAL HIGH
Urobilinogen, UA: 1
pH: 6

## 2011-01-26 LAB — COMPREHENSIVE METABOLIC PANEL
Alkaline Phosphatase: 50
BUN: 18
CO2: 25
Chloride: 102
Creatinine, Ser: 0.76
GFR calc non Af Amer: >60
Glucose, Bld: 53 — ABNORMAL LOW
Potassium: 2.9 — ABNORMAL LOW
Total Bilirubin: 0.6

## 2011-01-26 LAB — URINE MICROSCOPIC-ADD ON

## 2011-01-26 LAB — RAPID URINE DRUG SCREEN, HOSP PERFORMED
Amphetamines: NOT DETECTED
Benzodiazepines: POSITIVE — AB
Cocaine: NOT DETECTED

## 2011-01-26 LAB — ETHANOL: Alcohol, Ethyl (B): <5

## 2011-01-26 LAB — DIFFERENTIAL
Basophils Absolute: 0
Basophils Relative: 0
Lymphocytes Relative: 20
Monocytes Absolute: 0.6
Neutro Abs: 7.3
Neutrophils Relative %: 71

## 2011-01-28 LAB — I-STAT 8, (EC8 V) (CONVERTED LAB)
BUN: 26 — ABNORMAL HIGH
Chloride: 113 — ABNORMAL HIGH
Glucose, Bld: 156 — ABNORMAL HIGH
HCT: 45
Hemoglobin: 15.3 — ABNORMAL HIGH
Operator id: 272551
Potassium: 3.9
Sodium: 141

## 2011-01-28 LAB — POCT PREGNANCY, URINE: Preg Test, Ur: NEGATIVE

## 2011-01-28 LAB — DIFFERENTIAL
Eosinophils Absolute: 0.1
Lymphocytes Relative: 2 — ABNORMAL LOW
Lymphs Abs: 0.3 — ABNORMAL LOW
Monocytes Relative: 3
Neutro Abs: 15 — ABNORMAL HIGH
Neutrophils Relative %: 95 — ABNORMAL HIGH

## 2011-01-28 LAB — URINE CULTURE: Colony Count: 30000

## 2011-01-28 LAB — URINALYSIS, ROUTINE W REFLEX MICROSCOPIC
Ketones, ur: 15 — AB
Specific Gravity, Urine: 1.036 — ABNORMAL HIGH
pH: 5.5

## 2011-01-28 LAB — CBC
MCV: 87.3
RBC: 4.59
WBC: 15.8 — ABNORMAL HIGH

## 2011-01-28 LAB — URINE MICROSCOPIC-ADD ON

## 2011-01-28 LAB — POCT I-STAT CREATININE
Creatinine, Ser: 0.9
Operator id: 272551

## 2011-02-04 LAB — WET PREP, GENITAL: Trich, Wet Prep: NONE SEEN

## 2011-02-04 LAB — URINALYSIS, ROUTINE W REFLEX MICROSCOPIC
Bilirubin Urine: NEGATIVE
Ketones, ur: NEGATIVE
Nitrite: NEGATIVE
Protein, ur: NEGATIVE

## 2011-02-04 LAB — GC/CHLAMYDIA PROBE AMP, GENITAL: GC Probe Amp, Genital: NEGATIVE

## 2011-04-26 ENCOUNTER — Emergency Department (INDEPENDENT_AMBULATORY_CARE_PROVIDER_SITE_OTHER)
Admission: EM | Admit: 2011-04-26 | Discharge: 2011-04-26 | Disposition: A | Discharge status: Home / Self Care | Payer: Self-pay | Source: Home / Self Care | Attending: Emergency Medicine | Admitting: Emergency Medicine

## 2011-04-26 ENCOUNTER — Encounter: Payer: Self-pay | Admitting: *Deleted

## 2011-04-26 DIAGNOSIS — Z22322 Carrier or suspected carrier of Methicillin resistant Staphylococcus aureus: Secondary | ICD-10-CM

## 2011-04-26 DIAGNOSIS — B86 Scabies: Secondary | ICD-10-CM

## 2011-04-26 HISTORY — DX: Carrier or suspected carrier of methicillin resistant Staphylococcus aureus: Z22.322

## 2011-04-26 MED ORDER — HYDROXYZINE HCL 25 MG PO TABS: 25.0000 mg | ORAL_TABLET | Freq: Four times a day (QID) | ORAL | Status: AC | PRN

## 2011-04-26 MED ORDER — PERMETHRIN 5 % EX CREA: TOPICAL_CREAM | CUTANEOUS | Status: AC

## 2011-04-26 NOTE — ED Provider Notes (Signed)
History     CSN: [id number]  Arrival date & time 04/26/11  1213   First MD Initiated Contact with Patient 04/26/11 1304      Chief Complaint  Patient presents with  . Rash    HPI Comments: Pt with progressively worsening  itchy rash between fingers, over arms and legs starting 1 month ago. States itching worst at night. No sensation of being bitten at night, no blood on bedclothes in am. No new lotions, soaps, detergents, medications. Fiance with similar rash. No pets in the home. No exposure to poison ivy. Has not tried anything for this.  Patient is a 31 y.o. female presenting with rash. The history is provided by the patient.  Rash  This is a new problem. The current episode started more than 1 week ago. There has been no fever.    Past Medical History  Diagnosis Date  . Asthma     Past Surgical History  Procedure Date  . Tonsillectomy   . Tubal ligation     History reviewed. No pertinent family history.  History  Substance Use Topics  . Smoking status: Current Everyday Smoker  . Smokeless tobacco: Not on file  . Alcohol Use: Yes    OB History    Grav Para Term Preterm Abortions TAB SAB Ect Mult Living                  Review of Systems  Constitutional: Negative for fever.  HENT: Negative for facial swelling.   Gastrointestinal: Negative for nausea and vomiting.  Skin: Positive for rash.  Neurological: Negative for weakness.    Allergies  Augmentin; Erythromycin; and Zithromax  Home Medications   Current Outpatient Rx  Name Route Sig Dispense Refill  . HYDROXYZINE HCL 25 MG PO TABS Oral Take 1 tablet (25 mg total) by mouth every 6 (six) hours as needed for itching. 20 tablet 0  . PERMETHRIN 5 % EX CREA  Apply from chin down, leave on for 8-14 hours, rinse. Repeat in 1 week 60 g 0    BP 115/79  Pulse 68  Temp(Src) 98.4 F (36.9 C) (Oral)  Resp 16  SpO2 98%  LMP 03/26/2011  Physical Exam  Nursing note and vitals reviewed. Constitutional: She  is oriented to person, place, and time. She appears well-developed and well-nourished. No distress.  HENT:  Head: Normocephalic and atraumatic.  Eyes: EOM are normal. Pupils are equal, round, and reactive to light.  Neck: Normal range of motion.  Cardiovascular: Regular rhythm.   Pulmonary/Chest: Effort normal and breath sounds normal.  Abdominal: She exhibits no distension.  Musculoskeletal: Normal range of motion.  Neurological: She is alert and oriented to person, place, and time.  Skin: Skin is warm and dry. Rash noted.       Scattered papular rash with excoriations over arms, hands, lower legs, under bra line, on back. Burrows between fingers.   Psychiatric: She has a normal mood and affect. Her behavior is normal. Judgment and thought content normal.    ED Course  Procedures (including critical care time)  Labs Reviewed - No data to display No results found.   1. Scabies       MDM  Home with permethrin, atarax.   Luiz Blare, MD 04/26/11 1444

## 2011-04-26 NOTE — ED Notes (Signed)
Pt is here with 1 month history of itching, boyfriend also affected.

## 2011-08-17 ENCOUNTER — Emergency Department (HOSPITAL_COMMUNITY)
Admission: EM | Admit: 2011-08-17 | Discharge: 2011-08-17 | Disposition: A | Discharge status: Home / Self Care | Payer: Self-pay | Attending: Emergency Medicine | Admitting: Emergency Medicine

## 2011-08-17 ENCOUNTER — Encounter (HOSPITAL_COMMUNITY): Payer: Self-pay | Admitting: *Deleted

## 2011-08-17 DIAGNOSIS — K089 Disorder of teeth and supporting structures, unspecified: Secondary | ICD-10-CM | POA: Insufficient documentation

## 2011-08-17 DIAGNOSIS — Z881 Allergy status to other antibiotic agents status: Secondary | ICD-10-CM | POA: Insufficient documentation

## 2011-08-17 DIAGNOSIS — F17200 Nicotine dependence, unspecified, uncomplicated: Secondary | ICD-10-CM | POA: Insufficient documentation

## 2011-08-17 DIAGNOSIS — K0889 Other specified disorders of teeth and supporting structures: Secondary | ICD-10-CM

## 2011-08-17 DIAGNOSIS — J45909 Unspecified asthma, uncomplicated: Secondary | ICD-10-CM | POA: Insufficient documentation

## 2011-08-17 MED ORDER — PENICILLIN V POTASSIUM 500 MG PO TABS: 500.0000 mg | ORAL_TABLET | Freq: Four times a day (QID) | ORAL | Status: DC

## 2011-08-17 MED ORDER — OXYCODONE-ACETAMINOPHEN 5-325 MG PO TABS: 1.0000 | ORAL_TABLET | Freq: Four times a day (QID) | ORAL | Status: DC | PRN

## 2011-08-17 NOTE — Discharge Instructions (Signed)
You have dental pain. Use the resource guide listed below to help you find a dentist if you do not already have one to followup with. It is very important that you get evaluated by a dentist as soon as possible. Call tomorrow to schedule an appointment. Use your pain medication as prescribed and do not operate heavy machinery while on pain medication. Note that your pain medication contains acetaminophen (Tylenol) & its is not reccommended that you use additional acetaminophen (Tylenol) while taking this medication. Take your full course of antibiotics. Read the instructions below.  Eat a soft or liquid diet and rinse your mouth out after meals with warm water. You should see a dentist or return here at once if you have increased swelling, increased pain or uncontrolled bleeding from the site of your injury.   SEEK MEDICAL CARE IF:   You have increased pain not controlled with medicines.   You have swelling around your tooth, in your face or neck.   You have bleeding which starts, continues, or gets worse.   You have a fever >101  If you are unable to open your mouth  RESOURCE GUIDE  Dental Problems  Patients with Medicaid: [facility] [phone] [address]                                           [phone] [address] Phone:  [phone]                                                  Phone:  [phone]  If unable to pay or uninsured, contact:  [facility] or [facility]. to become qualified for the adult dental clinic.  Chronic Pain Problems Contact [facility]  [phone] Patients need to be referred by their primary care doctor.  Insufficient Money for Medicine Contact [employer]:  call "211" or [facility] [phone].  No Primary Care Doctor Call [facility]  [phone] Other agencies that provide inexpensive medical care    [facility]  [phone]    [facility]  Medicine  [phone]    [facility]  [phone]    [facility]  6231320216    [facility]  [phone]    [facility]  [phone]  Psychological Services [facility]  [phone] [facility]  [phone] [facility]   [phone] (emergency services [phone])  Substance Abuse Resources [facility] Services  551-334-9090 [facility] 424-281-3103 [facility] [place] 458-428-6332 Floydene Flock 431-222-8083 Residential & Outpatient Substance Abuse Program  ([phone] [phone]  Abuse/Neglect [facility] Child Abuse Hotline 302-322-0790 [facility] Child Abuse Hotline 954-788-6298 (After Hours)  Emergency Shelter [facility] [phone]  Maternity Homes [facility] [place] 731-656-7946 [facility] [phone]  MRSA Hotline #:   (850) 681-1125    [facility] [place]  [facility] [address] [place]                       [address]      [address]  [place]                                                [place]                            [place] Phone:  [phone]                                   Phone:  [phone]                 Phone:  [phone]  [place] [facility] Phone:  [phone]  [place] Child Abuse Hotline (336) 342-1394 (336) [phone] (After Hours)    

## 2011-08-17 NOTE — ED Provider Notes (Signed)
Medical screening examination/treatment/procedure(s) were performed by non-physician practitioner and as supervising physician I was immediately available for consultation/collaboration.  Cheri Guppy, MD 08/17/11 [phone]

## 2011-08-17 NOTE — ED Provider Notes (Signed)
History     CSN: [id number]  Arrival date & time 08/17/11  1055   First MD Initiated Contact with Patient 08/17/11 1112      Chief Complaint  Patient presents with  . Dental Pain    (Consider location/radiation/quality/duration/timing/severity/associated sxs/prior treatment) HPI Comments: Patient comes in today with pain of her right lower molar tooth.  She reports that the pain has been present for the past 2 weeks and is gradually becoming worse. She has tried taking ibuprofen for the pain and also tried using Oragel, which only provides minimal relief.  Patient does not have a dentist.  Patient is a 31 y.o. female presenting with tooth pain. The history is provided by the patient.  Dental PainPrimary symptoms do not include dental injury, fever or shortness of breath. The symptoms are worsening. The symptoms occur constantly.  Additional symptoms include: dental sensitivity to temperature, gum swelling and gum tenderness. Additional symptoms do not include: purulent gums, trismus, facial swelling, trouble swallowing, pain with swallowing, excessive salivation, drooling and ear pain.    Past Medical History  Diagnosis Date  . Asthma     Past Surgical History  Procedure Date  . Tonsillectomy   . Tubal ligation     No family history on file.  History  Substance Use Topics  . Smoking status: Current Everyday Smoker  . Smokeless tobacco: Not on file  . Alcohol Use: Yes    OB History    Grav Para Term Preterm Abortions TAB SAB Ect Mult Living                  Review of Systems  Constitutional: Negative for fever and chills.  HENT: Negative for ear pain, facial swelling, drooling, trouble swallowing, neck pain, neck stiffness and voice change.   Respiratory: Negative for shortness of breath.     Allergies  Augmentin; Erythromycin; Zithromax; and Sudafed  Home Medications   Current Outpatient Rx  Name Route Sig Dispense Refill  . IBUPROFEN 200 MG PO TABS Oral  Take 200 mg by mouth every 6 (six) hours as needed. For pain      BP 112/72  Pulse 101  Temp(Src) 98.1 F (36.7 C) (Oral)  Resp 16  SpO2 99%  Physical Exam  Nursing note and vitals reviewed. Constitutional: She is oriented to person, place, and time. She appears well-developed and well-nourished. No distress.  HENT:  Head: Normocephalic and atraumatic. No trismus in the jaw.  Mouth/Throat: Uvula is midline, oropharynx is clear and moist and mucous membranes are normal. Abnormal dentition. No dental abscesses or uvula swelling. No oropharyngeal exudate, posterior oropharyngeal edema, posterior oropharyngeal erythema or tonsillar abscesses.       Poor dental hygiene. Pt able to open and close mouth with out difficulty. Airway intact. Uvula midline. Mild gingival swelling with tenderness over affected area, but no fluctuance. No swelling or tenderness of submental and submandibular regions.  Eyes: Conjunctivae and EOM are normal.  Neck: Normal range of motion and full passive range of motion without pain. Neck supple.  Cardiovascular: Normal rate and regular rhythm.   Pulmonary/Chest: Effort normal and breath sounds normal. No stridor. No respiratory distress. She has no wheezes.  Musculoskeletal: Normal range of motion.  Lymphadenopathy:       Head (right side): No submental, no submandibular, no tonsillar, no preauricular and no posterior auricular adenopathy present.       Head (left side): No submental, no submandibular, no tonsillar, no preauricular and no posterior auricular adenopathy  present.    She has no cervical adenopathy.  Neurological: She is alert and oriented to person, place, and time.  Skin: Skin is warm and dry. No rash noted. She is not diaphoretic.    ED Course  Procedures (including critical care time)  Labs Reviewed - No data to display No results found.   No diagnosis found.    MDM  Patient with toothache.  No gross abscess.  Exam unconcerning for  Ludwig's angina or spread of infection.  Will treat with penicillin and pain medicine.  Urged patient to follow-up with dentist.  Patient given dentist referral.        Pascal Lux Graf, PA-C 08/17/11 1824

## 2011-08-17 NOTE — ED Notes (Signed)
Patient with complaints of right sided toothache x 2 weeks.

## 2011-08-23 ENCOUNTER — Encounter (HOSPITAL_COMMUNITY): Payer: Self-pay | Admitting: *Deleted

## 2011-08-23 ENCOUNTER — Emergency Department (HOSPITAL_COMMUNITY)
Admission: EM | Admit: 2011-08-23 | Discharge: 2011-08-23 | Disposition: A | Discharge status: Home / Self Care | Payer: Self-pay | Attending: Emergency Medicine | Admitting: Emergency Medicine

## 2011-08-23 DIAGNOSIS — F17200 Nicotine dependence, unspecified, uncomplicated: Secondary | ICD-10-CM | POA: Insufficient documentation

## 2011-08-23 DIAGNOSIS — K089 Disorder of teeth and supporting structures, unspecified: Secondary | ICD-10-CM | POA: Insufficient documentation

## 2011-08-23 DIAGNOSIS — K0889 Other specified disorders of teeth and supporting structures: Secondary | ICD-10-CM

## 2011-08-23 MED ORDER — HYDROCODONE-ACETAMINOPHEN 5-500 MG PO TABS: 1.0000 | ORAL_TABLET | Freq: Four times a day (QID) | ORAL | Status: AC | PRN

## 2011-08-23 NOTE — Discharge Instructions (Signed)
Call Dr. Lacretia Leigh office today to discuss dental pain at site of extraction for recheck. Apply warm compresses to jaw throughout the day. Finish your antibiotic. Take hydrocodone-acetaminophen as directed, as needed for pain but do not drive or operate machinery with pain medication use. Followup with a dentist is very important for ongoing evaluation and management of recurrent dental pain. However return to emergency department for emergent changing or worsening symptoms.  Dental Pain A tooth ache may be caused by cavities (tooth decay). Cavities expose the nerve of the tooth to air and hot or cold temperatures. It may come from an infection or abscess (also called a boil or furuncle) around your tooth. It is also often caused by dental caries (tooth decay). This causes the pain you are having. DIAGNOSIS  Your caregiver can diagnose this problem by exam. TREATMENT   If caused by an infection, it may be treated with medications which kill germs (antibiotics) and pain medications as prescribed by your caregiver. Take medications as directed.   Only take over-the-counter or prescription medicines for pain, discomfort, or fever as directed by your caregiver.   Whether the tooth ache today is caused by infection or dental disease, you should see your dentist as soon as possible for further care.  SEEK MEDICAL CARE IF: The exam and treatment you received today has been provided on an emergency basis only. This is not a substitute for complete medical or dental care. If your problem worsens or new problems (symptoms) appear, and you are unable to meet with your dentist, call or return to this location. SEEK IMMEDIATE MEDICAL CARE IF:   You have a fever.   You develop redness and swelling of your face, jaw, or neck.   You are unable to open your mouth.   You have severe pain uncontrolled by pain medicine.  MAKE SURE YOU:   Understand these instructions.   Will watch your condition.   Will get  help right away if you are not doing well or get worse.  Document Released: 04/11/2005 Document Revised: 03/31/2011 Document Reviewed: 11/28/2007 [facility] Patient Information 2012 [place], [place].

## 2011-08-23 NOTE — ED Notes (Signed)
Patient states had right lower back tooth removed 5 days ago.  States pain 8/10 throbbing continuous states pain right jaw.  Airway intact bilateral equal chest rise and fall.

## 2011-08-23 NOTE — ED Notes (Signed)
Pt is here with right lower tooth extraction pain from 2 days ago

## 2011-08-23 NOTE — ED Provider Notes (Signed)
Medical screening examination/treatment/procedure(s) were performed by non-physician practitioner and as supervising physician I was immediately available for consultation/collaboration.   Elektra Wartman, MD 08/23/11 1519 

## 2011-08-23 NOTE — ED Provider Notes (Signed)
History     CSN: [id number]  Arrival date & time 08/23/11  1191   First MD Initiated Contact with Patient 08/23/11 [phone]      Chief Complaint  Patient presents with  . Dental Pain    tooth extraction on thursday and is on antibiotics    (Consider location/radiation/quality/duration/timing/severity/associated sxs/prior treatment) HPI  Patient presents to emergency department complaining of right lower dental pain associated with dental extraction of a molar. Patient states that she seen by Dr. Lucky Cowboy 2 days ago for right lower molar dental extraction. Patient states she's had constant ongoing pain since the extraction. Patient states that 800 mg ibuprofen has not improved her pain. Patient states she's currently taking her antibiotic as directed by the dentist. Patient did not call the dental office today because she states "I have no insurance and was afraid he was going to charge me again." Patient states she did speak with Dr. Jeanice Lim, an oral surgeon who extracted her wisdom teeth in the past and states that she was told that if referred from [facility] that he could see her in the office. Patient denies any fevers, chills, swelling or face, or difficulty swallowing or breathing. Patient states pain has been constant and unchanging since the day of extraction.  Past Medical History  Diagnosis Date  . Asthma     Past Surgical History  Procedure Date  . Tonsillectomy   . Tubal ligation     No family history on file.  History  Substance Use Topics  . Smoking status: Current Everyday Smoker  . Smokeless tobacco: Not on file  . Alcohol Use: Yes    OB History    Grav Para Term Preterm Abortions TAB SAB Ect Mult Living                  Review of Systems  All other systems reviewed and are negative.    Allergies  Amoxicillin-pot clavulanate; Erythromycin; Zithromax; and Sudafed  Home Medications   Current Outpatient Rx  Name Route Sig Dispense Refill  . BC HEADACHE  POWDER PO Oral Take 1 packet by mouth daily as needed. As needed for pain.    . IBUPROFEN 200 MG PO TABS Oral Take 800-1,000 mg by mouth every 4 (four) hours as needed. For pain    . PENICILLIN V POTASSIUM 500 MG PO TABS Oral Take 500 mg by mouth 4 (four) times daily. Started 4/24.    Marland Kitchen HYDROCODONE-ACETAMINOPHEN 5-500 MG PO TABS Oral Take 1-2 tablets by mouth every 6 (six) hours as needed for pain. 15 tablet 0    BP 119/71  Pulse 100  Temp(Src) 97.6 F (36.4 C) (Oral)  Resp 18  SpO2 100%  Physical Exam  Nursing note and vitals reviewed. Constitutional: She is oriented to person, place, and time. She appears well-developed and well-nourished.  HENT:  Head: Normocephalic and atraumatic.       Right lower 2nd molar dental extraction without bleeding, necrosis of gingiva or gingival swelling or mass  Eyes: Conjunctivae are normal.  Neck: Normal range of motion. Neck supple.  Cardiovascular: Normal rate and regular rhythm.   Pulmonary/Chest: Effort normal and breath sounds normal.  Musculoskeletal: Normal range of motion.  Lymphadenopathy:    She has no cervical adenopathy.  Neurological: She is alert and oriented to person, place, and time.  Skin: Skin is warm and dry.  Psychiatric: She has a normal mood and affect. Her behavior is normal.    ED Course  Procedures (  including critical care time)  Labs Reviewed - No data to display No results found.   1. Pain, dental       MDM  Spoke at length with patient about the need for following up with the dentist who preformed the procedure to evaluate ongoing pain. She has no signs or symptoms of abscess or dry socket. Patient is agreeable to following up with Dr. Lucky Cowboy who performed her dental extraction but would like referral to Dr. Jeanice Lim who has performed wisdom teeth extraction in the past for her. She's afebrile nontoxic-appearing. She swallowing her secretions well.        Jenness Corner, [place] 08/23/11 1013

## 2012-03-07 ENCOUNTER — Emergency Department (HOSPITAL_COMMUNITY): Payer: Self-pay

## 2012-03-07 ENCOUNTER — Encounter (HOSPITAL_COMMUNITY): Payer: Self-pay | Admitting: *Deleted

## 2012-03-07 ENCOUNTER — Emergency Department (HOSPITAL_COMMUNITY)
Admission: EM | Admit: 2012-03-07 | Discharge: 2012-03-08 | Disposition: A | Discharge status: Home / Self Care | Payer: Self-pay | Attending: Emergency Medicine | Admitting: Emergency Medicine

## 2012-03-07 DIAGNOSIS — J45909 Unspecified asthma, uncomplicated: Secondary | ICD-10-CM | POA: Insufficient documentation

## 2012-03-07 DIAGNOSIS — F17200 Nicotine dependence, unspecified, uncomplicated: Secondary | ICD-10-CM | POA: Insufficient documentation

## 2012-03-07 DIAGNOSIS — R569 Unspecified convulsions: Secondary | ICD-10-CM

## 2012-03-07 DIAGNOSIS — H5710 Ocular pain, unspecified eye: Secondary | ICD-10-CM | POA: Insufficient documentation

## 2012-03-07 HISTORY — DX: Unspecified convulsions: R56.9

## 2012-03-07 LAB — CBC WITH DIFFERENTIAL/PLATELET
Basophils Relative: 0 % (ref 0–1)
Eosinophils Absolute: 0.3 10*3/uL (ref 0.0–0.7)
Hemoglobin: 13.8 g/dL (ref 12.0–15.0)
Lymphs Abs: 2.6 10*3/uL (ref 0.7–4.0)
MCH: 28.5 pg (ref 26.0–34.0)
MCHC: 33.4 g/dL (ref 30.0–36.0)
Monocytes Relative: 6 % (ref 3–12)
Neutro Abs: 7.3 10*3/uL (ref 1.7–7.7)
Neutrophils Relative %: 68 % (ref 43–77)
Platelets: 300 10*3/uL (ref 150–400)
RBC: 4.84 MIL/uL (ref 3.87–5.11)

## 2012-03-07 LAB — URINALYSIS, ROUTINE W REFLEX MICROSCOPIC
Nitrite: NEGATIVE
Protein, ur: NEGATIVE mg/dL
Specific Gravity, Urine: 1.027 (ref 1.005–1.030)
Urobilinogen, UA: 0.2 mg/dL (ref 0.0–1.0)

## 2012-03-07 LAB — CK: Total CK: 72 U/L (ref 7–177)

## 2012-03-07 LAB — URINE MICROSCOPIC-ADD ON

## 2012-03-07 LAB — BASIC METABOLIC PANEL
BUN: 13 mg/dL (ref 6–23)
Chloride: 103 mEq/L (ref 96–112)
GFR calc Af Amer: >90 mL/min (ref >90)
GFR calc non Af Amer: >90 mL/min (ref >90)
Potassium: 3.8 mEq/L (ref 3.5–5.1)
Sodium: 141 mEq/L (ref 135–145)

## 2012-03-07 LAB — RAPID URINE DRUG SCREEN, HOSP PERFORMED
Amphetamines: NOT DETECTED
Opiates: NOT DETECTED

## 2012-03-07 LAB — PREGNANCY, URINE: Preg Test, Ur: NEGATIVE

## 2012-03-07 MED ORDER — LEVETIRACETAM 500 MG PO TABS
500.0000 mg | ORAL_TABLET | Freq: Once | ORAL | Status: AC
  Administered 2012-03-07: 500 mg via ORAL
  Filled 2012-03-07: qty 1

## 2012-03-07 MED ORDER — MORPHINE SULFATE 4 MG/ML IJ SOLN
4.0000 mg | Freq: Once | INTRAMUSCULAR | Status: AC
  Administered 2012-03-07: 4 mg via INTRAVENOUS
  Filled 2012-03-07: qty 1

## 2012-03-07 MED ORDER — HYDROMORPHONE HCL PF 1 MG/ML IJ SOLN
1.0000 mg | Freq: Once | INTRAMUSCULAR | Status: AC
  Administered 2012-03-07: 1 mg via INTRAVENOUS
  Filled 2012-03-07: qty 1

## 2012-03-07 MED ORDER — LEVETIRACETAM 500 MG PO TABS: 500.0000 mg | ORAL_TABLET | Freq: Two times a day (BID) | ORAL | Status: DC

## 2012-03-07 MED ORDER — METOCLOPRAMIDE HCL 5 MG/ML IJ SOLN
10.0000 mg | Freq: Once | INTRAMUSCULAR | Status: AC
  Administered 2012-03-07: 10 mg via INTRAVENOUS
  Filled 2012-03-07: qty 2

## 2012-03-07 MED ORDER — SODIUM CHLORIDE 0.9 % IV BOLUS (SEPSIS)
1000.0000 mL | Freq: Once | INTRAVENOUS | Status: AC
  Administered 2012-03-07: 1000 mL via INTRAVENOUS

## 2012-03-07 NOTE — ED Notes (Signed)
Pt has history of seizures and no longer on medications because no insurance.  Pt states since having a seizure today she is having pain and sensitvity to light with eyes, back pain, and bilateral thigh pain

## 2012-03-07 NOTE — ED Notes (Signed)
Pt to CT

## 2012-03-07 NOTE — ED Provider Notes (Signed)
History     CSN: [id number]  Arrival date & time 03/07/12  1805   First MD Initiated Contact with Patient 03/07/12 2032      Chief Complaint  Patient presents with  . Seizures  . Pain/sensitivity to eyes     (Consider location/radiation/quality/duration/timing/severity/associated sxs/prior treatment) The history is provided by the patient.  [name] is a 31 y.o. female hx of seizures with medication noncompliance here with another seizure. Today, she had blurry vision and felt that she would have a seizure. She sat down and had a tonic clonic seizure and had post ictal period. No tongue biting or incontinence. Her last seizure was a year ago. Denies fevers. Was supposed to take lamictal 300 mg BID but hasn't been taking for years. Hasn't seen a neurologist for years due to lack of insurance.    Past Medical History  Diagnosis Date  . Asthma   . Seizures     Past Surgical History  Procedure Date  . Tonsillectomy   . Tubal ligation     No family history on file.  History  Substance Use Topics  . Smoking status: Current Every Day Smoker  . Smokeless tobacco: Not on file  . Alcohol Use: No    OB History    Grav Para Term Preterm Abortions TAB SAB Ect Mult Living                  Review of Systems  Neurological: Positive for seizures.  All other systems reviewed and are negative.    Allergies  Amoxicillin-pot clavulanate; Erythromycin; Zithromax; and Sudafed  Home Medications   Current Outpatient Rx  Name  Route  Sig  Dispense  Refill  . IBUPROFEN 200 MG PO TABS   Oral   Take 400 mg by mouth every 4 (four) hours as needed. For pain           BP 123/85  Pulse 97  Temp 98.4 F (36.9 C) (Oral)  Resp 18  SpO2 100%  LMP 03/01/2012  Physical Exam  Nursing note and vitals reviewed. Constitutional: She is oriented to person, place, and time. She appears well-developed and well-nourished.       Mild photophobia   HENT:  Head: Normocephalic.    Eyes: Conjunctivae normal and EOM are normal. Pupils are equal, round, and reactive to light.  Neck: Normal range of motion. Neck supple.       No meningeal signs   Cardiovascular: Normal rate, regular rhythm and normal heart sounds.   Pulmonary/Chest: Effort normal and breath sounds normal. No respiratory distress. She has no wheezes. She has no rales.  Abdominal: Soft. Bowel sounds are normal. She exhibits no distension. There is no tenderness. There is no rebound.  Musculoskeletal: Normal range of motion. She exhibits no edema.  Neurological: She is alert and oriented to person, place, and time.  Skin: Skin is warm and dry.  Psychiatric: She has a normal mood and affect. Her behavior is normal. Judgment and thought content normal.    ED Course  Procedures (including critical care time)  Labs Reviewed  CBC WITH DIFFERENTIAL - Abnormal; Notable for the following:    WBC 10.9 (*)     All other components within normal limits  URINALYSIS, ROUTINE W REFLEX MICROSCOPIC - Abnormal; Notable for the following:    APPearance CLOUDY (*)     Leukocytes, UA SMALL (*)     All other components within normal limits  URINE RAPID DRUG SCREEN (HOSP PERFORMED) -  Abnormal; Notable for the following:    Benzodiazepines POSITIVE (*)     Tetrahydrocannabinol POSITIVE (*)     All other components within normal limits  URINE MICROSCOPIC-ADD ON - Abnormal; Notable for the following:    Squamous Epithelial / LPF MANY (*)     Bacteria, UA FEW (*)     All other components within normal limits  BASIC METABOLIC PANEL  CK  PREGNANCY, URINE  URINE CULTURE   Ct Head Wo Contrast  03/07/2012  *RADIOLOGY REPORT*  Clinical Data: Seizure  CT HEAD WITHOUT CONTRAST  Technique:  Contiguous axial images were obtained from the base of the skull through the vertex without contrast.  Comparison: 03/05/2008  Findings: The brain has a normal appearance without evidence for hemorrhage, infarction, hydrocephalus, or mass  lesion.  There is no extra axial fluid collection.  There is mucosal thickening of the right maxillary sinus.  Partial opacification of the ethmoid air cells noted.  The skull appears intact.  IMPRESSION:  1.  No acute intracranial abnormalities. 2.  Right maxillary sinus and ethmoid air cell opacification.   Original Report Authenticated By: Signa Kell, M.D.      No diagnosis found.    MDM  [name] is a 31 y.o. female here with seizure. Will check labs, CT head. Will give antiepileptics.   09:00 PM I consulted neurologist, Dr. Amada Jupiter, who recommended loading with Keppra PO and start on Keppra. It is more effective and it is cheaper than lamictal.   10:51 PM Felt better. Headache improved. UA contaminated and she is asymptomatic. Urine culture sent. U tox positive for benzos, marijuana. I counseled patient to stop using drugs and take keppra 500mg  BID and f/u with neuro.        Richardean Canal, MD 03/07/12 2252

## 2012-03-14 ENCOUNTER — Encounter (HOSPITAL_COMMUNITY): Payer: Self-pay | Admitting: Emergency Medicine

## 2012-03-14 ENCOUNTER — Observation Stay (HOSPITAL_COMMUNITY)
Admission: EM | Admit: 2012-03-14 | Discharge: 2012-03-16 | Disposition: A | Discharge status: Home / Self Care | Payer: Self-pay | Attending: Internal Medicine | Admitting: Internal Medicine

## 2012-03-14 DIAGNOSIS — R569 Unspecified convulsions: Secondary | ICD-10-CM

## 2012-03-14 DIAGNOSIS — J45909 Unspecified asthma, uncomplicated: Secondary | ICD-10-CM | POA: Insufficient documentation

## 2012-03-14 DIAGNOSIS — L0390 Cellulitis, unspecified: Secondary | ICD-10-CM | POA: Diagnosis present

## 2012-03-14 DIAGNOSIS — G40909 Epilepsy, unspecified, not intractable, without status epilepticus: Secondary | ICD-10-CM | POA: Insufficient documentation

## 2012-03-14 DIAGNOSIS — Z91199 Patient's noncompliance with other medical treatment and regimen due to unspecified reason: Secondary | ICD-10-CM | POA: Insufficient documentation

## 2012-03-14 DIAGNOSIS — Z9119 Patient's noncompliance with other medical treatment and regimen: Secondary | ICD-10-CM | POA: Insufficient documentation

## 2012-03-14 DIAGNOSIS — L02419 Cutaneous abscess of limb, unspecified: Principal | ICD-10-CM | POA: Insufficient documentation

## 2012-03-14 HISTORY — DX: Adverse effect of unspecified anesthetic, initial encounter: T41.45XA

## 2012-03-14 HISTORY — DX: Depression, unspecified: F32.A

## 2012-03-14 HISTORY — DX: Major depressive disorder, single episode, unspecified: F32.9

## 2012-03-14 HISTORY — DX: Other complications of anesthesia, initial encounter: T88.59XA

## 2012-03-14 LAB — CBC
HCT: 34 % — ABNORMAL LOW (ref 36.0–46.0)
Hemoglobin: 11 g/dL — ABNORMAL LOW (ref 12.0–15.0)
MCV: 85.6 fL (ref 78.0–100.0)
RBC: 3.97 MIL/uL (ref 3.87–5.11)
WBC: 10.1 10*3/uL (ref 4.0–10.5)

## 2012-03-14 MED ORDER — MORPHINE SULFATE 4 MG/ML IJ SOLN
4.0000 mg | Freq: Once | INTRAMUSCULAR | Status: AC
  Administered 2012-03-14: 4 mg via INTRAVENOUS
  Filled 2012-03-14: qty 1

## 2012-03-14 MED ORDER — SODIUM CHLORIDE 0.9 % IV BOLUS (SEPSIS)
1000.0000 mL | Freq: Once | INTRAVENOUS | Status: AC
  Administered 2012-03-14: 1000 mL via INTRAVENOUS

## 2012-03-14 MED ORDER — VANCOMYCIN HCL IN DEXTROSE 1-5 GM/200ML-% IV SOLN
1000.0000 mg | Freq: Once | INTRAVENOUS | Status: AC
  Administered 2012-03-14: 1000 mg via INTRAVENOUS
  Filled 2012-03-14: qty 200

## 2012-03-14 MED ORDER — ONDANSETRON HCL 4 MG/2ML IJ SOLN
4.0000 mg | Freq: Once | INTRAMUSCULAR | Status: AC
  Administered 2012-03-14: 4 mg via INTRAVENOUS
  Filled 2012-03-14: qty 2

## 2012-03-14 NOTE — ED Notes (Signed)
Pt thinks she might have gotten bit on R leg noticed red spot 2 days ago, now entire leg is red and swollen with open sore with drainage

## 2012-03-14 NOTE — ED Provider Notes (Addendum)
History     CSN: [id number]  Arrival date & time 03/14/12  2303   First MD Initiated Contact with Patient 03/14/12 2310      Chief Complaint  Patient presents with  . Cellulitis    (Consider location/radiation/quality/duration/timing/severity/associated sxs/prior treatment) Patient is a 31 y.o. female presenting with rash.  Rash  This is a new problem. The current episode started more than 2 days ago. The problem has been gradually worsening. The problem is associated with nothing (pt states popped bump, now with swelling and redness to leg wit increasing pain). There has been no fever. The rash is present on the right lower leg. The pain is at a severity of 4/10. The pain is mild. The pain has been constant since onset. Associated symptoms include weeping. She has tried nothing for the symptoms.    Past Medical History  Diagnosis Date  . Asthma   . Seizures     Past Surgical History  Procedure Date  . Tonsillectomy   . Tubal ligation   . Adenoidectomy     History reviewed. No pertinent family history.  History  Substance Use Topics  . Smoking status: Current Every Day Smoker -- 0.2 packs/day  . Smokeless tobacco: Not on file  . Alcohol Use: No    OB History    Grav Para Term Preterm Abortions TAB SAB Ect Mult Living                  Review of Systems  Skin: Positive for rash.  All other systems reviewed and are negative.    Allergies  Amoxicillin-pot clavulanate; Erythromycin; Zithromax; and Sudafed  Home Medications   Current Outpatient Rx  Name  Route  Sig  Dispense  Refill  . ACETAMINOPHEN 500 MG PO TABS   Oral   Take 500 mg by mouth every 6 (six) hours as needed. For pain         . IBUPROFEN 200 MG PO TABS   Oral   Take 400 mg by mouth every 4 (four) hours as needed. For pain           BP 111/74  Pulse 111  Temp 98 F (36.7 C) (Oral)  Resp 20  SpO2 98%  LMP 03/01/2012  Physical Exam  Constitutional: She is oriented to person,  place, and time. She appears well-developed and well-nourished.  HENT:  Head: Normocephalic and atraumatic.  Eyes: Conjunctivae normal and EOM are normal. Pupils are equal, round, and reactive to light.  Neck: Normal range of motion.  Cardiovascular: Regular rhythm and normal heart sounds.  Tachycardia present.   Pulmonary/Chest: Effort normal and breath sounds normal.  Abdominal: Soft. Bowel sounds are normal.  Musculoskeletal: Normal range of motion.  Neurological: She is alert and oriented to person, place, and time.  Skin: Skin is warm and dry. There is erythema.       Central area of induration and open sore .5 cm.  With surrounding erythema involving the entire calf with streaking up leg  Psychiatric: She has a normal mood and affect. Her behavior is normal.    ED Course  Procedures (including critical care time)   Labs Reviewed  CBC  CULTURE, BLOOD (ROUTINE X 2)  CULTURE, BLOOD (ROUTINE X 2)  COMPREHENSIVE METABOLIC PANEL   No results found.   No diagnosis found.    MDM  + cellulitis. Less than 48hrs  Will iv abx, iv fluid,  mll cdu on cellulitis protocol,  reassess  Rosanne Ashing, MD 03/14/12 2325  Rosanne Ashing, MD 03/14/12 2358

## 2012-03-15 ENCOUNTER — Encounter (HOSPITAL_COMMUNITY): Payer: Self-pay | Admitting: General Practice

## 2012-03-15 DIAGNOSIS — L02419 Cutaneous abscess of limb, unspecified: Secondary | ICD-10-CM

## 2012-03-15 DIAGNOSIS — L03119 Cellulitis of unspecified part of limb: Secondary | ICD-10-CM

## 2012-03-15 DIAGNOSIS — R569 Unspecified convulsions: Secondary | ICD-10-CM | POA: Diagnosis present

## 2012-03-15 DIAGNOSIS — L0390 Cellulitis, unspecified: Secondary | ICD-10-CM | POA: Diagnosis present

## 2012-03-15 DIAGNOSIS — L0291 Cutaneous abscess, unspecified: Secondary | ICD-10-CM

## 2012-03-15 LAB — COMPREHENSIVE METABOLIC PANEL
ALT: 12 U/L (ref 0–35)
BUN: 16 mg/dL (ref 6–23)
CO2: 30 mEq/L (ref 19–32)
Calcium: 9 mg/dL (ref 8.4–10.5)
GFR calc Af Amer: >90 mL/min (ref >90)
GFR calc non Af Amer: >90 mL/min (ref >90)
Glucose, Bld: 95 mg/dL (ref 70–99)
Sodium: 139 mEq/L (ref 135–145)

## 2012-03-15 LAB — CBC
HCT: 30.9 % — ABNORMAL LOW (ref 36.0–46.0)
MCHC: 31.1 g/dL (ref 30.0–36.0)
Platelets: 209 10*3/uL (ref 150–400)
RDW: 13.5 % (ref 11.5–15.5)
WBC: 5.9 10*3/uL (ref 4.0–10.5)

## 2012-03-15 LAB — CREATININE, SERUM
Creatinine, Ser: 0.7 mg/dL (ref 0.50–1.10)
GFR calc Af Amer: >90 mL/min (ref >90)
GFR calc non Af Amer: >90 mL/min (ref >90)

## 2012-03-15 MED ORDER — ONDANSETRON HCL 4 MG/2ML IJ SOLN: 4.0000 mg | Freq: Four times a day (QID) | INTRAMUSCULAR | Status: DC | PRN

## 2012-03-15 MED ORDER — SODIUM CHLORIDE 0.9 % IV BOLUS (SEPSIS)
1000.0000 mL | Freq: Once | INTRAVENOUS | Status: AC
  Administered 2012-03-15: 1000 mL via INTRAVENOUS

## 2012-03-15 MED ORDER — ALBUTEROL SULFATE (5 MG/ML) 0.5% IN NEBU: 2.5000 mg | INHALATION_SOLUTION | RESPIRATORY_TRACT | Status: DC | PRN

## 2012-03-15 MED ORDER — ACETAMINOPHEN 650 MG RE SUPP: 650.0000 mg | Freq: Four times a day (QID) | RECTAL | Status: DC | PRN

## 2012-03-15 MED ORDER — MORPHINE SULFATE 2 MG/ML IJ SOLN
2.0000 mg | INTRAMUSCULAR | Status: DC | PRN
  Administered 2012-03-15 – 2012-03-16 (×4): 2 mg via INTRAVENOUS
  Filled 2012-03-15 (×4): qty 1

## 2012-03-15 MED ORDER — POLYETHYLENE GLYCOL 3350 17 G PO PACK
17.0000 g | PACK | Freq: Two times a day (BID) | ORAL | Status: DC
  Administered 2012-03-15: 17 g via ORAL
  Filled 2012-03-15 (×3): qty 1

## 2012-03-15 MED ORDER — MORPHINE SULFATE 4 MG/ML IJ SOLN
4.0000 mg | INTRAMUSCULAR | Status: DC | PRN
  Administered 2012-03-15: 4 mg via INTRAVENOUS
  Filled 2012-03-15 (×2): qty 1

## 2012-03-15 MED ORDER — ONDANSETRON HCL 4 MG PO TABS: 4.0000 mg | ORAL_TABLET | Freq: Four times a day (QID) | ORAL | Status: DC | PRN

## 2012-03-15 MED ORDER — ALUM & MAG HYDROXIDE-SIMETH 200-200-20 MG/5ML PO SUSP: 30.0000 mL | Freq: Four times a day (QID) | ORAL | Status: DC | PRN

## 2012-03-15 MED ORDER — ONDANSETRON HCL 4 MG/2ML IJ SOLN
4.0000 mg | Freq: Once | INTRAMUSCULAR | Status: AC
  Administered 2012-03-15: 4 mg via INTRAVENOUS
  Filled 2012-03-15: qty 2

## 2012-03-15 MED ORDER — ENOXAPARIN SODIUM 40 MG/0.4ML ~~LOC~~ SOLN
40.0000 mg | SUBCUTANEOUS | Status: DC
  Administered 2012-03-15: 40 mg via SUBCUTANEOUS
  Filled 2012-03-15 (×2): qty 0.4

## 2012-03-15 MED ORDER — ACETAMINOPHEN 325 MG PO TABS
650.0000 mg | ORAL_TABLET | ORAL | Status: DC | PRN
  Administered 2012-03-15: 650 mg via ORAL
  Filled 2012-03-15: qty 2

## 2012-03-15 MED ORDER — LEVETIRACETAM 500 MG PO TABS
500.0000 mg | ORAL_TABLET | Freq: Two times a day (BID) | ORAL | Status: DC
  Administered 2012-03-15 – 2012-03-16 (×2): 500 mg via ORAL
  Filled 2012-03-15 (×3): qty 1

## 2012-03-15 MED ORDER — SODIUM CHLORIDE 0.9 % IV SOLN
INTRAVENOUS | Status: AC
  Administered 2012-03-15: 16:00:00 via INTRAVENOUS

## 2012-03-15 MED ORDER — SODIUM CHLORIDE 0.9 % IV SOLN
INTRAVENOUS | Status: DC
  Administered 2012-03-16: 05:00:00 via INTRAVENOUS

## 2012-03-15 MED ORDER — ACETAMINOPHEN 325 MG PO TABS: 650.0000 mg | ORAL_TABLET | Freq: Four times a day (QID) | ORAL | Status: DC | PRN

## 2012-03-15 MED ORDER — CLINDAMYCIN PHOSPHATE 600 MG/50ML IV SOLN
600.0000 mg | Freq: Three times a day (TID) | INTRAVENOUS | Status: DC
  Administered 2012-03-15 (×2): 600 mg via INTRAVENOUS
  Filled 2012-03-15 (×4): qty 50

## 2012-03-15 MED ORDER — DIPHENHYDRAMINE HCL 50 MG/ML IJ SOLN
25.0000 mg | Freq: Once | INTRAMUSCULAR | Status: AC
  Administered 2012-03-15: 25 mg via INTRAVENOUS
  Filled 2012-03-15: qty 1

## 2012-03-15 MED ORDER — MORPHINE SULFATE 4 MG/ML IJ SOLN
4.0000 mg | Freq: Once | INTRAMUSCULAR | Status: AC
  Administered 2012-03-15: 4 mg via INTRAVENOUS
  Filled 2012-03-15: qty 1

## 2012-03-15 MED ORDER — HYDROCODONE-ACETAMINOPHEN 5-325 MG PO TABS
1.0000 | ORAL_TABLET | ORAL | Status: DC | PRN
  Administered 2012-03-16: 2 via ORAL
  Filled 2012-03-15: qty 2

## 2012-03-15 MED ORDER — ONDANSETRON HCL 4 MG/2ML IJ SOLN: 4.0000 mg | Freq: Three times a day (TID) | INTRAMUSCULAR | Status: AC | PRN

## 2012-03-15 MED ORDER — CLINDAMYCIN PHOSPHATE 600 MG/50ML IV SOLN
600.0000 mg | Freq: Three times a day (TID) | INTRAVENOUS | Status: DC
  Administered 2012-03-15 – 2012-03-16 (×3): 600 mg via INTRAVENOUS
  Filled 2012-03-15 (×4): qty 50

## 2012-03-15 MED ORDER — HYDROMORPHONE HCL PF 1 MG/ML IJ SOLN
1.0000 mg | INTRAMUSCULAR | Status: AC | PRN
  Administered 2012-03-15: 1 mg via INTRAVENOUS
  Filled 2012-03-15: qty 1

## 2012-03-15 NOTE — ED Notes (Signed)
Pt right calf is red. Pt states that 3 days ago she had a bump on her calf she opened it and then pus came out and she thought it was fine until she started noticing the redness, swelling heat and pain. Pt skin marked.

## 2012-03-15 NOTE — ED Notes (Signed)
Report called  

## 2012-03-15 NOTE — Progress Notes (Signed)
[name] [id number] Code Status: full   Admission Data: 03/15/2012 5:24 PM Attending Provider:   Dr. Jerral Ralph JXB:[id number], MD Consults/ Treatment Team: Treatment Team:  Md Ccs, MD  [name] is a 31 y.o. female patient admitted from ED awake, alert  & orientated  X 3, no distress noted.  VSS - Blood pressure 92/54, pulse 82, temperature 97.6 F (36.4 C), temperature source Oral, resp. rate 18, height 5\' 7"  (1.702 m), weight 55.974 kg (123 lb 6.4 oz), last menstrual period 03/01/2012, SpO2 99.00%.  no c/o shortness of breath, no c/o chest pain.  IV in place with a transparent dsg that's clean dry and intact without redness.    Allergies:   Allergies  Allergen Reactions  . Amoxicillin-Pot Clavulanate Nausea And Vomiting    rash  . Erythromycin Nausea And Vomiting    rash  . Zithromax (Azithromycin) Nausea And Vomiting    rash  . Sudafed (Pseudoephedrine Hcl) Rash    Makes heart race     Past Medical History  Diagnosis Date  . Asthma   . Seizures    Medications Prior to Admission  Medication Sig Dispense Refill  . acetaminophen (TYLENOL) 500 MG tablet Take 500 mg by mouth every 6 (six) hours as needed. For pain      . ibuprofen (ADVIL,MOTRIN) 200 MG tablet Take 400 mg by mouth every 4 (four) hours as needed. For pain       Pt orientation to unit, room and routine. Information packet given to patient/family and safety video watched.  Admission INP armband ID verified with patient/family, and in place. SR up x 2, fall risk assessment complete with Patient and family verbalizing understanding of risks associated with falls. Pt verbalizes an understanding of how to use the call bell and to call for help before getting out of bed.  Skin, clean-dry- intact without evidence of bruising, or skin tears.   No evidence of skin break down noted on exam. Patient has cellulitis to right leg and s/p I and D at bedside  Will cont to eval and treat per MD orders.  Bing Quarry, RN 03/15/2012 5:24 PM

## 2012-03-15 NOTE — ED Notes (Signed)
Patient complaining about finger O2 saturation sticker hurting.  Removed sensor from finger and placed on another finger.  No further complaints.

## 2012-03-15 NOTE — Progress Notes (Signed)
Utilization review completed.  P.J. Novelle Addair,RN,BSN Case Manager [phone]  

## 2012-03-15 NOTE — ED Provider Notes (Signed)
7:18 AM Patient is in the CDU under observation, cellulitis protocol.  This is a shared visit with Dr Verl Bangs.  Per EPIC notes, patient with cellulitis to right lower extremity.  To be reassessed following IVF, IV abx.    8:27 AM Patient reports slight worsening of swelling and pain of RLE.  Pt explains that she had an allergic reaction to vancomycin with rash on chest and back and sensation of needles sticking in her skin last night and therefore she was switched to clindamycin.  On exam, pt is A&O, NAD, right lower extremity with erythema, edema, warmth, tenderness, central abscess, indurated with active purulent drainage.  Erythema is slightly extended beyond the lines drawn last night in several directions (medially and inferiorly).   12:00 PM After second dose of clindamycin, erythema has not progressed beyond area it spread to this morning.  Pt reports continued pain in the area.    1:44 PM Pt with some mild spread of erythema beyond second line drawn this morning.  Pt reports increased pain and swelling.  Will admit to hospital.  PCP Holley Bouche, [place].    2:12 PM Pt admitted to [facility].    Filed Vitals:   03/15/12 1452  BP: 100/63  Pulse: 68  Temp: 97.7 F (36.5 C)  Resp: 18     Results for orders placed during the hospital encounter of 03/14/12  CBC      Component Value Range   WBC 10.1  4.0 - 10.5 K/uL   RBC 3.97  3.87 - 5.11 MIL/uL   Hemoglobin 11.0 (*) 12.0 - 15.0 g/dL   HCT 16.1 (*) 09.6 - 04.5 %   MCV 85.6  78.0 - 100.0 fL   MCH 27.7  26.0 - 34.0 pg   MCHC 32.4  30.0 - 36.0 g/dL   RDW 40.9  81.1 - 91.4 %   Platelets 225  150 - 400 K/uL  COMPREHENSIVE METABOLIC PANEL      Component Value Range   Sodium 139  135 - 145 mEq/L   Potassium 3.8  3.5 - 5.1 mEq/L   Chloride 101  96 - 112 mEq/L   CO2 30  19 - 32 mEq/L   Glucose, Bld 95  70 - 99 mg/dL   BUN 16  6 - 23 mg/dL   Creatinine, Ser 7.82  0.50 - 1.10 mg/dL   Calcium 9.0  8.4 - 95.6 mg/dL   Total  Protein 7.5  6.0 - 8.3 g/dL   Albumin 3.6  3.5 - 5.2 g/dL   AST 17  0 - 37 U/L   ALT 12  0 - 35 U/L   Alkaline Phosphatase 46  39 - 117 U/L   Total Bilirubin 0.3  0.3 - 1.2 mg/dL   GFR calc non Af Amer >90  >90 mL/min   GFR calc Af Amer >90  >90 mL/min  WOUND CULTURE      Component Value Range   Specimen Description LEG     Special Requests NONE     Gram Stain       Value: MODERATE WBC PRESENT,BOTH PMN AND MONONUCLEAR     NO SQUAMOUS EPITHELIAL CELLS SEEN     NO ORGANISMS SEEN   Culture PENDING     Report Status PENDING         Trixie Dredge, [place] 03/15/12 1525

## 2012-03-15 NOTE — Consult Note (Signed)
[name] 1981-02-16  [id number].    Requesting MD: Dr. Jeoffrey Massed Chief Complaint/Reason for Consult: right calf abscess HPI: This is a 31 yo female who is unsure whether she got bite by something or what, but she noticed a "bump" on her calf several days ago.  She tried to pop it 2 days ago and since then it has worsened.  She does have some edema, erythema, and warmth.  She finally came to the [facility] last night for further evaluation.  She was admitted and we were asked to see her for I&D.  Review of Systems: Please see HPI, otherwise all other systems are negative  History reviewed. No pertinent family history.  Past Medical History  Diagnosis Date  . Asthma   . Seizures   . Complication of anesthesia     " I have a hard time waking & I cry "  . Depression     panic disorder    Past Surgical History  Procedure Date  . Tonsillectomy   . Tubal ligation   . Adenoidectomy     Social History:  reports that she has been smoking.  She does not have any smokeless tobacco history on file. She reports that she uses illicit drugs (Marijuana). She reports that she does not drink alcohol.  Allergies:  Allergies  Allergen Reactions  . Amoxicillin-Pot Clavulanate Nausea And Vomiting    rash  . Erythromycin Nausea And Vomiting    rash  . Zithromax (Azithromycin) Nausea And Vomiting    rash  . Sudafed (Pseudoephedrine Hcl) Rash    Makes heart race    Medications Prior to Admission  Medication Sig Dispense Refill  . acetaminophen (TYLENOL) 500 MG tablet Take 500 mg by mouth every 6 (six) hours as needed. For pain      . ibuprofen (ADVIL,MOTRIN) 200 MG tablet Take 400 mg by mouth every 4 (four) hours as needed. For pain        Blood pressure 92/54, pulse 82, temperature 97.6 F (36.4 C), temperature source Oral, resp. rate 18, height 5\' 7"  (1.702 m), weight 123 lb 6.4 oz (55.974 kg), last menstrual period 03/01/2012, SpO2 99.00%. Physical Exam: General: pleasant, WD, WN  white female who is laying in bed in NAD HEENT: head is normocephalic, atraumatic.  Sclera are noninjected.  PERRL.  Ears and nose without any masses or lesions.  Mouth is pink and moist.  Several piercings noted Skin: warm and dry with no masses, lesions, or rashes.  She does have some mild erythema of her right lower extremity with some mild edema.  She has a 3x1.5cm area of fluctuance with a central opening and some purulent drainage noted. Psych: A&Ox3 with an appropriate affect.    Results for orders placed during the hospital encounter of 03/14/12 (from the past 48 hour(s))  CBC     Status: Abnormal   Collection Time   03/14/12 10:26 PM      Component Value Range Comment   WBC 10.1  4.0 - 10.5 K/uL    RBC 3.97  3.87 - 5.11 MIL/uL    Hemoglobin 11.0 (*) 12.0 - 15.0 g/dL    HCT 78.2 (*) 95.6 - 46.0 %    MCV 85.6  78.0 - 100.0 fL    MCH 27.7  26.0 - 34.0 pg    MCHC 32.4  30.0 - 36.0 g/dL    RDW 21.3  08.6 - 57.8 %    Platelets 225  150 - 400 K/uL  COMPREHENSIVE METABOLIC PANEL     Status: Normal   Collection Time   03/14/12 10:26 PM      Component Value Range Comment   Sodium 139  135 - 145 mEq/L    Potassium 3.8  3.5 - 5.1 mEq/L    Chloride 101  96 - 112 mEq/L    CO2 30  19 - 32 mEq/L    Glucose, Bld 95  70 - 99 mg/dL    BUN 16  6 - 23 mg/dL    Creatinine, Ser 1.61  0.50 - 1.10 mg/dL    Calcium 9.0  8.4 - 09.6 mg/dL    Total Protein 7.5  6.0 - 8.3 g/dL    Albumin 3.6  3.5 - 5.2 g/dL    AST 17  0 - 37 U/L    ALT 12  0 - 35 U/L    Alkaline Phosphatase 46  39 - 117 U/L    Total Bilirubin 0.3  0.3 - 1.2 mg/dL    GFR calc non Af Amer >90  >90 mL/min    GFR calc Af Amer >90  >90 mL/min   WOUND CULTURE     Status: Normal (Preliminary result)   Collection Time   03/15/12  3:48 AM      Component Value Range Comment   Specimen Description LEG      Special Requests NONE      Gram Stain        Value: MODERATE WBC PRESENT,BOTH PMN AND MONONUCLEAR     NO SQUAMOUS EPITHELIAL  CELLS SEEN     NO ORGANISMS SEEN   Culture PENDING      Report Status PENDING     CBC     Status: Abnormal   Collection Time   03/15/12  4:06 PM      Component Value Range Comment   WBC 5.9  4.0 - 10.5 K/uL    RBC 3.58 (*) 3.87 - 5.11 MIL/uL    Hemoglobin 9.6 (*) 12.0 - 15.0 g/dL    HCT 04.5 (*) 40.9 - 46.0 %    MCV 86.3  78.0 - 100.0 fL    MCH 26.8  26.0 - 34.0 pg    MCHC 31.1  30.0 - 36.0 g/dL    RDW 81.1  91.4 - 78.2 %    Platelets 209  150 - 400 K/uL   CREATININE, SERUM     Status: Normal   Collection Time   03/15/12  4:06 PM      Component Value Range Comment   Creatinine, Ser 0.70  0.50 - 1.10 mg/dL    GFR calc non Af Amer >90  >90 mL/min    GFR calc Af Amer >90  >90 mL/min    No results found.     Assessment/Plan: 1. Right calf abscess and cellulitis 2. Asthma 3. Seizure disorder  Plan: 1. Will plan bedside I&D of abscess. 2. Agree with abx therapy  Haedyn Breau E 03/15/2012, 6:36 PM Pager: [phone]

## 2012-03-15 NOTE — Procedures (Signed)
Incision and Drainage Procedure Note  Pre-operative Diagnosis: right calf abscess  Post-operative Diagnosis: same  Indications: This is a 31 yo female with a right calf abscess that has been present for several days.  It has become fluctuant and appropriate for drainage  Anesthesia: 2% lidocaine  Procedure Details  The procedure, risks and complications have been discussed in detail (including, but not limited to infection, bleeding) with the patient.  The skin was sterilely prepped and draped over the affected area in the usual fashion. After adequate local anesthesia, I&D with a #11 blade was performed on the right mid calf. Purulent drainage: absent, just mostly serosanguinous drainage.  This wound was probed well with a Rebbeca Sheperd clamp.  Hemostasis was achieved, and the wound was packed with 1/4" iodoform gauze. The patient was observed until stable.  Findings: No purulent drainage  EBL: 5cc's  Drains: none  Condition: Tolerated the procedure well, stable condition   Complications: None   Yorley Buch E 03/15/2012

## 2012-03-15 NOTE — H&P (Signed)
PATIENT DETAILS Name: [name] Age: 31 y.o. Sex: female Date of Birth: [birth year] Admit Date: 03/14/2012 ZOX:[id number],[id number], MD   CHIEF COMPLAINT:  Right leg pain and swelling for the past 3 days  HPI: Patient is a 31 year old old Caucasian female with a past medical history of asthma, seizures (noncompliant to anti- seizure medication secondary to financial reasons) came into the hospital for evaluation of right leg pain with swelling and redness. Per patient this started around 3 days ago, it started out as a small boil however it worsened over the past few days. She was evaluated in the emergency room, placed in the CDU cellulitis protocol. She was initially given vancomycin-however and developed an allergic reaction to it and this was then changed to clindamycin. It was felt that her swelling and erythema were not improving with antibiotic therapy, I was subsequently asked to admit this patient for further evaluation and treatment. She denies any fever. She denies any chest pain or shortness of breath. She denies any nausea vomiting or diarrhea. She denies any dysuria.   ALLERGIES:   Allergies  Allergen Reactions  . Amoxicillin-Pot Clavulanate Nausea And Vomiting    rash  . Erythromycin Nausea And Vomiting    rash  . Zithromax (Azithromycin) Nausea And Vomiting    rash  . Sudafed (Pseudoephedrine Hcl) Rash    Makes heart race    PAST MEDICAL HISTORY: Past Medical History  Diagnosis Date  . Asthma   . Seizures     PAST SURGICAL HISTORY: Past Surgical History  Procedure Date  . Tonsillectomy   . Tubal ligation   . Adenoidectomy     MEDICATIONS AT HOME: Prior to Admission medications   Medication Sig Start Date End Date Taking? Authorizing Provider  acetaminophen (TYLENOL) 500 MG tablet Take 500 mg by mouth every 6 (six) hours as needed. For pain   Yes Historical Provider, MD  ibuprofen (ADVIL,MOTRIN) 200 MG tablet Take 400 mg by mouth every 4 (four) hours as  needed. For pain   Yes Historical Provider, MD    FAMILY HISTORY: Mother has borderline diabetes.  SOCIAL HISTORY:  reports that she has been smoking.  She does not have any smokeless tobacco history on file. She reports that she uses illicit drugs (Marijuana). She reports that she does not drink alcohol.  REVIEW OF SYSTEMS:  Constitutional:   No  weight loss, night sweats,  Fevers, chills, fatigue.  HEENT:    No headaches, Difficulty swallowing,Tooth/dental problems,Sore throat,  No sneezing, itching, ear ache, nasal congestion, post nasal drip,   Cardio-vascular: No chest pain,  Orthopnea, PND, swelling in lower extremities, anasarca,dizziness, palpitations  GI:  No heartburn, indigestion, abdominal pain, nausea, vomiting, diarrhea, change in bowel habits, loss of appetite  Resp: No shortness of breath with exertion or at rest.  No excess mucus, no productive cough, No non-productive cough,  No coughing up of blood.No change in color of mucus.No wheezing.No chest wall deformity  Skin:  no rash or lesions.  GU:  no dysuria, change in color of urine, no urgency or frequency.  No flank pain.  Musculoskeletal: No joint pain or swelling.  No decreased range of motion.  No back pain.  Psych: No change in mood or affect. No depression or anxiety.  No memory loss.   PHYSICAL EXAM: Blood pressure 92/54, pulse 82, temperature 97.6 F (36.4 C), temperature source Oral, resp. rate 18, height 5\' 7"  (1.702 m), weight 55.974 kg (123 lb 6.4 oz), last menstrual period 03/01/2012, SpO2  99.00%.  General appearance :Awake, alert, not in any distress. Speech Clear. Not toxic Looking HEENT: Atraumatic and Normocephalic, pupils equally reactive to light and accomodation Neck: supple, no JVD. No cervical lymphadenopathy.  Chest:Good air entry bilaterally, no added sounds  CVS: S1 S2 regular, no murmurs.  Abdomen: Bowel sounds present, Non tender and not distended with no gaurding, rigidity  or rebound. Extremities: B/L Lower Ext shows no edema, both legs are warm to touch. The right lower extremity-in the calf area-there is a approximate 5x3 cm indurated area with a pus point. On gently squeezing this area some back pus is expressed. There is very minimal erythema. Neurology: Awake alert, and oriented X 3, CN II-XII intact, Non focal Skin:No Rash Wounds:N/A  LABS ON ADMISSION:   Basename 03/14/12 2226  NA 139  K 3.8  CL 101  CO2 30  GLUCOSE 95  BUN 16  CREATININE 0.61  CALCIUM 9.0  MG --  PHOS --    Basename 03/14/12 2226  AST 17  ALT 12  ALKPHOS 46  BILITOT 0.3  PROT 7.5  ALBUMIN 3.6   No results found for this basename: LIPASE:2,AMYLASE:2 in the last 72 hours  Basename 03/14/12 2226  WBC 10.1  NEUTROABS --  HGB 11.0*  HCT 34.0*  MCV 85.6  PLT 225   No results found for this basename: CKTOTAL:3,CKMB:3,CKMBINDEX:3,TROPONINI:3 in the last 72 hours No results found for this basename: DDIMER:2 in the last 72 hours No components found with this basename: POCBNP:3   RADIOLOGIC STUDIES ON ADMISSION: No results found.  ASSESSMENT AND PLAN: Present on Admission:  . Cellulitis with a small abscess in the right lower extremity  -We'll continue clindamycin  -Await culture sensitivity  -Have consulted with [facility] for a bedside I&D   . Seizure -Apparently was on Lamictal-but has been noncompliant for the past 2 years due to financial reasons. Apparently patient gets seizures very frequently, last seizure was a few weeks back, she had an ED visit, Neurology was consulted and the patient was discharged from the emergency room on Keppra, however patient could not afford it and is not taking it anymore. For now I will restart Keppra, I will speak with case management in the morning and see what the options are.   Further plan will depend as patient's clinical course evolves and further radiologic and laboratory data become available. Patient will be monitored  closely.  DVT Prophylaxis: -Prophylactic Lovenox  Code Status: -Full code  Total time spent for admission equals 45 minutes.  [facility] Pager [phone]  If 7PM-7AM, please contact night-coverage [email or web address] Password [facility] 03/15/2012, 4:13 PM

## 2012-03-15 NOTE — ED Notes (Signed)
Pt A.O.x 4. Vitals stable. Respirations even and regular.  Area of redness noted on right calf. Quarter size bump with pin point area of puse.

## 2012-03-16 LAB — CBC
HCT: 29.1 % — ABNORMAL LOW (ref 36.0–46.0)
Hemoglobin: 9.5 g/dL — ABNORMAL LOW (ref 12.0–15.0)
MCV: 87.4 fL (ref 78.0–100.0)
RBC: 3.33 MIL/uL — ABNORMAL LOW (ref 3.87–5.11)
WBC: 4.5 10*3/uL (ref 4.0–10.5)

## 2012-03-16 LAB — BASIC METABOLIC PANEL
BUN: 7 mg/dL (ref 6–23)
CO2: 26 mEq/L (ref 19–32)
Chloride: 108 mEq/L (ref 96–112)
Creatinine, Ser: 0.6 mg/dL (ref 0.50–1.10)
GFR calc Af Amer: >90 mL/min (ref >90)
Glucose, Bld: 88 mg/dL (ref 70–99)
Potassium: 3.6 mEq/L (ref 3.5–5.1)

## 2012-03-16 LAB — RAPID URINE DRUG SCREEN, HOSP PERFORMED
Barbiturates: NOT DETECTED
Benzodiazepines: POSITIVE — AB

## 2012-03-16 MED ORDER — LEVETIRACETAM 500 MG PO TABS: 500.0000 mg | ORAL_TABLET | Freq: Two times a day (BID) | ORAL | Status: DC

## 2012-03-16 MED ORDER — CLINDAMYCIN HCL 300 MG PO CAPS: 600.0000 mg | ORAL_CAPSULE | Freq: Three times a day (TID) | ORAL | Status: DC

## 2012-03-16 MED ORDER — HYDROCODONE-ACETAMINOPHEN 5-325 MG PO TABS: 1.0000 | ORAL_TABLET | Freq: Four times a day (QID) | ORAL | Status: DC | PRN

## 2012-03-16 NOTE — Progress Notes (Signed)
Claris Gladden discharged Home per MD order.  Discharge instructions reviewed and discussed with the patient, all questions and concerns answered. Copy of instructions and scripts given to patient.  Explained to pt. How to sign up for mychart.   Andrea, [place]  Home Medication Instructions [id number]   Printed on:03/16/12 1254  Medication Information                    acetaminophen (TYLENOL) 500 MG tablet Take 500 mg by mouth every 6 (six) hours as needed. For pain           levETIRAcetam (KEPPRA) 500 MG tablet Take 1 tablet (500 mg total) by mouth 2 (two) times daily.           clindamycin (CLEOCIN) 300 MG capsule Take 2 capsules (600 mg total) by mouth 3 (three) times daily.           HYDROcodone-acetaminophen (NORCO) 5-325 MG per tablet Take 1 tablet by mouth every 6 (six) hours as needed for pain.             Patients skin is clean, dry and intact, no evidence of skin break down. IV site discontinued and catheter remains intact. Site without signs and symptoms of complications. Dressing and pressure applied.  Patient escorted to car by volunteer in a wheelchair,  no distress noted upon discharge.  Laural Benes, Tionne Carelli C 03/16/2012 12:54 PM

## 2012-03-16 NOTE — ED Provider Notes (Signed)
Medical screening examination/treatment/procedure(s) were performed by non-physician practitioner and as supervising physician I was immediately available for consultation/collaboration.  Pama Roskos Lytle Michaels, MD 03/16/12 [phone]

## 2012-03-16 NOTE — Progress Notes (Signed)
Patient ID: [name], female   DOB: 11/05/1980, 31 y.o.   MRN: [id number]    Subjective: Pt feels better today.  Still with some pain.  Objective: Vital signs in last 24 hours: Temp:  [97.6 F (36.4 C)-98.2 F (36.8 C)] 98.1 F (36.7 C) (11/22 0508) Pulse Rate:  [68-83] 70  (11/22 0508) Resp:  [18] 18  (11/22 0508) BP: (72-100)/(23-63) 88/54 mmHg (11/22 0508) SpO2:  [98 %-100 %] 99 % (11/22 0508) Weight:  [123 lb 6.4 oz (55.974 kg)] 123 lb 6.4 oz (55.974 kg) (11/21 1527) Last BM Date: 03/15/12  Intake/Output from previous day: 11/21 0701 - 11/22 0700 In: 905 [I.V.:905] Out: -  Intake/Output this shift:    PE: Ext: right leg with less edema and erythema.  Wound is clean and was repacked.  No purulent drainage  Lab Results:   Basename 03/16/12 0630 03/15/12 1606  WBC 4.5 5.9  HGB 9.5* 9.6*  HCT 29.1* 30.9*  PLT 173 209   BMET  Basename 03/16/12 0630 03/15/12 1606 03/14/12 2226  NA 142 -- 139  K 3.6 -- 3.8  CL 108 -- 101  CO2 26 -- 30  GLUCOSE 88 -- 95  BUN 7 -- 16  CREATININE 0.60 0.70 --  CALCIUM 8.1* -- 9.0   PT/INR No results found for this basename: LABPROT:2,INR:2 in the last 72 hours CMP     Component Value Date/Time   NA 142 03/16/2012 0630   K 3.6 03/16/2012 0630   CL 108 03/16/2012 0630   CO2 26 03/16/2012 0630   GLUCOSE 88 03/16/2012 0630   BUN 7 03/16/2012 0630   CREATININE 0.60 03/16/2012 0630   CALCIUM 8.1* 03/16/2012 0630   PROT 7.5 03/14/2012 2226   ALBUMIN 3.6 03/14/2012 2226   AST 17 03/14/2012 2226   ALT 12 03/14/2012 2226   ALKPHOS 46 03/14/2012 2226   BILITOT 0.3 03/14/2012 2226   GFRNONAA >90 03/16/2012 0630   GFRAA >90 03/16/2012 0630   Lipase  No results found for this basename: lipase       Studies/Results: No results found.  Anti-infectives: Anti-infectives     Start     Dose/Rate Route Frequency Ordered Stop   03/15/12 1700   clindamycin (CLEOCIN) IVPB 600 mg     Comments: Pharmacy to adjust dosing please      600 mg 100 mL/hr over 30 Minutes Intravenous 3 times per day 03/15/12 1559     03/15/12 0100   clindamycin (CLEOCIN) IVPB 600 mg  Status:  Discontinued        600 mg 100 mL/hr over 30 Minutes Intravenous 3 times per day 03/15/12 0059 03/15/12 1559   03/14/12 2330   vancomycin (VANCOCIN) IVPB 1000 mg/200 mL premix        1,000 mg 200 mL/hr over 60 Minutes Intravenous  Once 03/14/12 2322 03/15/12 0120           Assessment/Plan  1. Right calf cellulitis, abscess, s/p I&D  Plan: 1. Patient does not have insurance.  She is concerned about paying for a follow up visit.  D/W Dr. Jerral Ralph.  He will get her set up with the indigent [facility] that [facility] next week for follow up so they patient does not have to worry about paying for an office visit.  I will give her our information so she can call to be seen or if she has any questions in the meantime.  She is otherwise stable for dc home.  Cx reveals Staph.  This is pending, will likely be MRSA.  Will need 5-7 more days of abx therapy at home.   LOS: 2 days    Gautham Hewins E 03/16/2012, 9:05 AM Pager: [phone]

## 2012-03-16 NOTE — Care Management Note (Signed)
    Page 1 of 1   03/16/2012     11:34:39 AM   CARE MANAGEMENT NOTE 03/16/2012  Patient:  [name], [name]   Account Number:  [id number]  Date Initiated:  03/16/2012  Documentation initiated by:  Letha Cape  Subjective/Objective Assessment:   dx cellulitis, seizures  admit as observation     Action/Plan:   Anticipated DC Date:  03/16/2012   Anticipated DC Plan:  HOME/SELF CARE      DC Planning Services  CM consult  [facility]      Choice offered to / List presented to:             Status of service:  Completed, signed off Medicare Important Message given?   (If response is "NO", the following Medicare IM given date fields will be blank) Date Medicare IM given:   Date Additional Medicare IM given:    Discharge Disposition:  HOME/SELF CARE  Per UR Regulation:  Reviewed for med. necessity/level of care/duration of stay  If discussed at Long Length of Stay Meetings, dates discussed:    Comments:  03/16/12 11:33 Letha Cape RN, BSN [phone] patient for dc  today, patient used [employer] [facility] for Keppra and I also faxed patient asst form for keppra for patient.  Patient has trasnportation.

## 2012-03-16 NOTE — Consult Note (Signed)
I&D to be done at bedside.  [name]. Loretta Bon, MD, FACS [phone] [facility]

## 2012-03-16 NOTE — Discharge Summary (Addendum)
PATIENT DETAILS Name: [name] Age: 31 y.o. Sex: female Date of Birth: [birth year] MRN: [id number]. Admit Date: 03/14/2012 Admitting Physician: Maretta Bees, MD WUJ:[id number],WGNFAOZH, MD  Recommendations for Outpatient Follow-up:  1. General health maintenance on next visit 2. Check I&D site next visit  PRIMARY DISCHARGE DIAGNOSIS:  Principal Problem:  *Cellulitis Active Problems:  Seizure      PAST MEDICAL HISTORY: Past Medical History  Diagnosis Date  . Asthma   . Seizures   . Complication of anesthesia     " I have a hard time waking & I cry "  . Depression     panic disorder    DISCHARGE MEDICATIONS:   Medication List     As of 03/16/2012  9:49 AM    STOP taking these medications         ibuprofen 200 MG tablet   Commonly known as: ADVIL,MOTRIN      TAKE these medications         acetaminophen 500 MG tablet   Commonly known as: TYLENOL   Take 500 mg by mouth every 6 (six) hours as needed. For pain      clindamycin 300 MG capsule   Commonly known as: CLEOCIN   Take 2 capsules (600 mg total) by mouth 3 (three) times daily.      HYDROcodone-acetaminophen 5-325 MG per tablet   Commonly known as: NORCO/VICODIN   Take 1 tablet by mouth every 6 (six) hours as needed for pain.      levETIRAcetam 500 MG tablet   Commonly known as: KEPPRA   Take 1 tablet (500 mg total) by mouth 2 (two) times daily.           BRIEF HPI:  See H&P, Labs, Consult and Test reports for all details in brief, Patient is a 31 year old old Caucasian female with a past medical history of asthma, seizures (noncompliant to anti- seizure medication secondary to financial reasons) came into the hospital for evaluation of right leg pain with swelling and redness.  CONSULTATIONS:  [facility]  PERTINENT RADIOLOGIC STUDIES: Ct Head Wo Contrast  03/07/2012  *RADIOLOGY REPORT*  Clinical Data: Seizure  CT HEAD WITHOUT CONTRAST  Technique:  Contiguous axial images were obtained from the  base of the skull through the vertex without contrast.  Comparison: 03/05/2008  Findings: The brain has a normal appearance without evidence for hemorrhage, infarction, hydrocephalus, or mass lesion.  There is no extra axial fluid collection.  There is mucosal thickening of the right maxillary sinus.  Partial opacification of the ethmoid air cells noted.  The skull appears intact.  IMPRESSION:  1.  No acute intracranial abnormalities. 2.  Right maxillary sinus and ethmoid air cell opacification.   Original Report Authenticated By: Signa Kell, M.D.      PERTINENT LAB RESULTS: CBC:  Basename 03/16/12 0630 03/15/12 1606  WBC 4.5 5.9  HGB 9.5* 9.6*  HCT 29.1* 30.9*  PLT 173 209   CMET CMP     Component Value Date/Time   NA 142 03/16/2012 0630   K 3.6 03/16/2012 0630   CL 108 03/16/2012 0630   CO2 26 03/16/2012 0630   GLUCOSE 88 03/16/2012 0630   BUN 7 03/16/2012 0630   CREATININE 0.60 03/16/2012 0630   CALCIUM 8.1* 03/16/2012 0630   PROT 7.5 03/14/2012 2226   ALBUMIN 3.6 03/14/2012 2226   AST 17 03/14/2012 2226   ALT 12 03/14/2012 2226   ALKPHOS 46 03/14/2012 2226   BILITOT 0.3 03/14/2012 2226  GFRNONAA >90 03/16/2012 0630   GFRAA >90 03/16/2012 0630    GFR Estimated Creatinine Clearance: 91.7 ml/min (by C-G formula based on Cr of 0.6). No results found for this basename: LIPASE:2,AMYLASE:2 in the last 72 hours No results found for this basename: CKTOTAL:3,CKMB:3,CKMBINDEX:3,TROPONINI:3 in the last 72 hours No components found with this basename: POCBNP:3 No results found for this basename: DDIMER:2 in the last 72 hours No results found for this basename: HGBA1C:2 in the last 72 hours No results found for this basename: CHOL:2,HDL:2,LDLCALC:2,TRIG:2,CHOLHDL:2,LDLDIRECT:2 in the last 72 hours No results found for this basename: TSH,T4TOTAL,FREET3,T3FREE,THYROIDAB in the last 72 hours No results found for this basename:  VITAMINB12:2,FOLATE:2,FERRITIN:2,TIBC:2,IRON:2,RETICCTPCT:2 in the last 72 hours Coags: No results found for this basename: PT:2,INR:2 in the last 72 hours Microbiology: Recent Results (from the past 240 hour(s))  URINE CULTURE     Status: Normal   Collection Time   03/07/12  9:13 PM      Component Value Range Status Comment   Specimen Description URINE, RANDOM   Final    Special Requests NONE   Final    Culture  Setup Time 03/07/2012 22:07   Final    Colony Count NO GROWTH   Final    Culture NO GROWTH   Final    Report Status 03/09/2012 FINAL   Final   CULTURE, BLOOD (ROUTINE X 2)     Status: Normal (Preliminary result)   Collection Time   03/14/12 11:25 PM      Component Value Range Status Comment   Specimen Description BLOOD RIGHT ARM   Final    Special Requests BOTTLES DRAWN AEROBIC AND ANAEROBIC 5CC   Final    Culture  Setup Time 03/15/2012 04:39   Final    Culture     Final    Value:        BLOOD CULTURE RECEIVED NO GROWTH TO DATE CULTURE WILL BE HELD FOR 5 DAYS BEFORE ISSUING A FINAL NEGATIVE REPORT   Report Status PENDING   Incomplete   CULTURE, BLOOD (ROUTINE X 2)     Status: Normal (Preliminary result)   Collection Time   03/14/12 11:35 PM      Component Value Range Status Comment   Specimen Description BLOOD LEFT ARM   Final    Special Requests BOTTLES DRAWN AEROBIC ONLY 5CC   Final    Culture  Setup Time 03/15/2012 04:39   Final    Culture     Final    Value:        BLOOD CULTURE RECEIVED NO GROWTH TO DATE CULTURE WILL BE HELD FOR 5 DAYS BEFORE ISSUING A FINAL NEGATIVE REPORT   Report Status PENDING   Incomplete   WOUND CULTURE     Status: Normal (Preliminary result)   Collection Time   03/15/12  3:48 AM      Component Value Range Status Comment   Specimen Description LEG   Final    Special Requests NONE   Final    Gram Stain     Final    Value: MODERATE WBC PRESENT,BOTH PMN AND MONONUCLEAR     NO SQUAMOUS EPITHELIAL CELLS SEEN     NO ORGANISMS SEEN   Culture      Final    Value: FEW STAPHYLOCOCCUS AUREUS     Note: RIFAMPIN AND GENTAMICIN SHOULD NOT BE USED AS SINGLE DRUGS FOR TREATMENT OF STAPH INFECTIONS.   Report Status PENDING   Incomplete      BRIEF HOSPITAL COURSE:  Principal Problem:  *Cellulitis with abscess of the right leg/calf -patient was admitted, continued on IV clindamycin, she really did only have very minimal erythema/swelling of the affected area on admission. [facility] was consulted, a bedside I&D was done, no pus was expressed. This am-her leg continues to do well. Her only complaint is pain. On discharge she will be transitioned to oral Clindamycin for 5 more days and then stop. Wound care instructions provided to patient by [facility]  Active Problems:  Seizure -she will be discharged on Keppra.Case management will work with various support services to see if she can get supply prior to d/c -She has been instructed not to drive because of her seizure history     TODAY-DAY OF DISCHARGE:  Subjective:   Claris Gladden today has no headache,no chest abdominal pain,no new weakness tingling or numbness, feels much better wants to go home today.   Objective:  Blood pressure 88/54, pulse 70, temperature 98.1 F (36.7 C), temperature source Oral, resp. rate 18, height 5\' 7"  (1.702 m), weight 55.974 kg (123 lb 6.4 oz), last menstrual period 03/01/2012, SpO2 99.00%.  Intake/Output Summary (Last 24 hours) at 03/16/12 0949 Last data filed at 03/16/12 0447  Gross per 24 hour  Intake    905 ml  Output      0 ml  Net    905 ml    Exam Awake Alert, Oriented *3, No new F.N deficits, Normal affect [place].AT,PERRAL Supple Neck,No JVD, No cervical lymphadenopathy appriciated.  Symmetrical Chest wall movement, Good air movement bilaterally, CTAB RRR,No Gallops,Rubs or new Murmurs, No Parasternal Heave +ve B.Sounds, Abd Soft, Non tender, No organomegaly appriciated, No rebound -guarding or rigidity. No Cyanosis, Clubbing or edema, No new Rash or  bruise Right leg-decrease in swelling. No erythema.  DISCHARGE CONDITION: Stable  DISPOSITION:  HOME  DISCHARGE INSTRUCTIONS:    Activity:  As tolerated  No driving  Diet recommendation: Regular Diet  Follow-up Information    Follow up with [facility] ( as needed, [facility])    Contact information:   [address]   [place] [address] [phone]          Total Time spent on discharge equals 45 minutes.  SignedJeoffrey Massed 03/16/2012 9:49 AM

## 2012-03-17 LAB — WOUND CULTURE

## 2012-03-17 NOTE — Progress Notes (Signed)
Okay for the patient to be discharged.  [name]. Gae Bon, MD, FACS [phone] [facility]

## 2012-03-17 NOTE — Procedures (Signed)
I was present during the performance of this procedure.  Not much purulent drainage.  Marta Lamas. Gae Bon, MD, FACS [phone] [facility]

## 2012-03-21 LAB — CULTURE, BLOOD (ROUTINE X 2): Culture: NO GROWTH

## 2012-09-18 ENCOUNTER — Emergency Department (HOSPITAL_COMMUNITY)
Admission: EM | Admit: 2012-09-18 | Discharge: 2012-09-18 | Disposition: A | Discharge status: Home / Self Care | Payer: Self-pay | Attending: Emergency Medicine | Admitting: Emergency Medicine

## 2012-09-18 ENCOUNTER — Encounter (HOSPITAL_COMMUNITY): Payer: Self-pay | Admitting: Emergency Medicine

## 2012-09-18 DIAGNOSIS — IMO0002 Reserved for concepts with insufficient information to code with codable children: Secondary | ICD-10-CM | POA: Insufficient documentation

## 2012-09-18 DIAGNOSIS — F329 Major depressive disorder, single episode, unspecified: Secondary | ICD-10-CM | POA: Insufficient documentation

## 2012-09-18 DIAGNOSIS — F3289 Other specified depressive episodes: Secondary | ICD-10-CM | POA: Insufficient documentation

## 2012-09-18 DIAGNOSIS — R6883 Chills (without fever): Secondary | ICD-10-CM | POA: Insufficient documentation

## 2012-09-18 DIAGNOSIS — J45909 Unspecified asthma, uncomplicated: Secondary | ICD-10-CM | POA: Insufficient documentation

## 2012-09-18 DIAGNOSIS — F17200 Nicotine dependence, unspecified, uncomplicated: Secondary | ICD-10-CM | POA: Insufficient documentation

## 2012-09-18 DIAGNOSIS — L02413 Cutaneous abscess of right upper limb: Secondary | ICD-10-CM

## 2012-09-18 DIAGNOSIS — G40909 Epilepsy, unspecified, not intractable, without status epilepticus: Secondary | ICD-10-CM | POA: Insufficient documentation

## 2012-09-18 DIAGNOSIS — L03113 Cellulitis of right upper limb: Secondary | ICD-10-CM

## 2012-09-18 DIAGNOSIS — Z880 Allergy status to penicillin: Secondary | ICD-10-CM | POA: Insufficient documentation

## 2012-09-18 DIAGNOSIS — Z79899 Other long term (current) drug therapy: Secondary | ICD-10-CM | POA: Insufficient documentation

## 2012-09-18 HISTORY — DX: Carrier or suspected carrier of methicillin resistant Staphylococcus aureus: Z22.322

## 2012-09-18 MED ORDER — SULFAMETHOXAZOLE-TRIMETHOPRIM 800-160 MG PO TABS: 1.0000 | ORAL_TABLET | Freq: Two times a day (BID) | ORAL | Status: DC

## 2012-09-18 MED ORDER — SULFAMETHOXAZOLE-TMP DS 800-160 MG PO TABS
1.0000 | ORAL_TABLET | Freq: Once | ORAL | Status: AC
  Administered 2012-09-18: 1 via ORAL
  Filled 2012-09-18: qty 1

## 2012-09-18 MED ORDER — HYDROCODONE-ACETAMINOPHEN 5-325 MG PO TABS: 1.0000 | ORAL_TABLET | Freq: Four times a day (QID) | ORAL | Status: DC | PRN

## 2012-09-18 NOTE — ED Notes (Signed)
Pt presents with multiple abcess to lower left arm.

## 2012-09-18 NOTE — ED Notes (Signed)
Bumps on rt arm x 5 days  Now more red states has mashed pus out of them she states

## 2012-09-18 NOTE — ED Provider Notes (Signed)
History     CSN: [id number]  Arrival date & time 09/18/12  4098   First MD Initiated Contact with Patient 09/18/12 [phone]      Chief Complaint  Patient presents with  . Rash    (Consider location/radiation/quality/duration/timing/severity/associated sxs/prior treatment) HPI [name] is a 32 y.o. female who presents to ED with complaint of abscesses and redness to the right forearm. States started as a bump 5 days ago, states she squeezed it and now she has several bumps two of which are swollen, red, painful. States redness is spreading as well. States hx of the same, and hospitalization for abscess and cellulitis last year.    Past Medical History  Diagnosis Date  . Asthma   . Seizures   . Complication of anesthesia     " I have a hard time waking & I cry "  . Depression     panic disorder    Past Surgical History  Procedure Laterality Date  . Tonsillectomy    . Tubal ligation    . Adenoidectomy      History reviewed. No pertinent family history.  History  Substance Use Topics  . Smoking status: Current Every Day Smoker -- 0.25 packs/day  . Smokeless tobacco: Not on file  . Alcohol Use: No    OB History   Grav Para Term Preterm Abortions TAB SAB Ect Mult Living                  Review of Systems  Constitutional: Positive for chills. Negative for fever.  Respiratory: Negative.   Cardiovascular: Negative.   Skin: Positive for color change and wound.  Neurological: Negative for weakness and numbness.    Allergies  Amoxicillin-pot clavulanate; Erythromycin; Vancomycin; Zithromax; and Sudafed  Home Medications   Current Outpatient Rx  Name  Route  Sig  Dispense  Refill  . naproxen sodium (ANAPROX) 220 MG tablet   Oral   Take 440 mg by mouth 2 (two) times daily with a meal.         . levETIRAcetam (KEPPRA) 500 MG tablet   Oral   Take 1 tablet (500 mg total) by mouth 2 (two) times daily.   60 tablet   0     BP 111/72  Pulse 74  Temp(Src)  98.5 F (36.9 C)  Resp 16  Physical Exam  Nursing note and vitals reviewed. Constitutional: She appears well-developed and well-nourished. No distress.  Eyes: Conjunctivae are normal.  Neck: Neck supple.  Cardiovascular: Normal rate, regular rhythm and normal heart sounds.   Pulmonary/Chest: Effort normal and breath sounds normal. No respiratory distress. She has no wheezes. She has no rales.  Neurological: She is alert.  Skin:  Several pustules to the left forearm. Most are <1cm large with no surrounding cellulitis. There are two larger ones, one about 2cm in diameter and one 1.5cm in diameter. Both tender, erythematous, indurated, fluctuant.     ED Course  Procedures (including critical care time)  INCISION AND DRAINAGE Performed by: Jaynie Crumble A Consent: Verbal consent obtained. Risks and benefits: risks, benefits and alternatives were discussed Type: abscess   Body area: right forearm  Anesthesia: local infiltration  Incision was made with a scalpel. Two incisions were made in two neighboring abscesses  Local anesthetic: lidocaine 2% w epinephrine  Anesthetic total: 2 ml  Complexity: complex Blunt dissection to break up loculations  Drainage: purulent  Drainage amount: moderate   Patient tolerance: Patient tolerated the procedure well with no  immediate complications.     1. Abscess of right forearm   2. Cellulitis of right forearm       MDM  PT with several pustules and two small abscess with mild surrounding cellulitis over right forearm. Hs of the same with prior hospitalization. Records reviewed, pt's culture at that time was MRSA positive. Two of the larger abscesses I&Ded. Will start on bactrim. Follow up with PCP for recheck in 2 days. Pt otherwise non toxic, no distress.   Filed Vitals:   09/18/12 0837 09/18/12 0928  BP: 111/72 96/61  Pulse: 74 69  Temp: 98.5 F (36.9 C) 98 F (36.7 C)  TempSrc:  Oral  Resp: 16 18  SpO2:  100%            Myriam Jacobson Merlene Dante, PA-C 09/18/12 1632

## 2012-09-19 ENCOUNTER — Emergency Department (HOSPITAL_COMMUNITY)
Admission: EM | Admit: 2012-09-19 | Discharge: 2012-09-19 | Disposition: A | Discharge status: Home / Self Care | Payer: Self-pay | Attending: Emergency Medicine | Admitting: Emergency Medicine

## 2012-09-19 ENCOUNTER — Encounter (HOSPITAL_COMMUNITY): Payer: Self-pay | Admitting: *Deleted

## 2012-09-19 DIAGNOSIS — F17200 Nicotine dependence, unspecified, uncomplicated: Secondary | ICD-10-CM | POA: Insufficient documentation

## 2012-09-19 DIAGNOSIS — G40909 Epilepsy, unspecified, not intractable, without status epilepticus: Secondary | ICD-10-CM | POA: Insufficient documentation

## 2012-09-19 DIAGNOSIS — Z8659 Personal history of other mental and behavioral disorders: Secondary | ICD-10-CM | POA: Insufficient documentation

## 2012-09-19 DIAGNOSIS — Z9119 Patient's noncompliance with other medical treatment and regimen: Secondary | ICD-10-CM | POA: Insufficient documentation

## 2012-09-19 DIAGNOSIS — Z79899 Other long term (current) drug therapy: Secondary | ICD-10-CM | POA: Insufficient documentation

## 2012-09-19 DIAGNOSIS — R569 Unspecified convulsions: Secondary | ICD-10-CM

## 2012-09-19 DIAGNOSIS — Z91199 Patient's noncompliance with other medical treatment and regimen due to unspecified reason: Secondary | ICD-10-CM | POA: Insufficient documentation

## 2012-09-19 DIAGNOSIS — J45909 Unspecified asthma, uncomplicated: Secondary | ICD-10-CM | POA: Insufficient documentation

## 2012-09-19 DIAGNOSIS — Z8614 Personal history of Methicillin resistant Staphylococcus aureus infection: Secondary | ICD-10-CM | POA: Insufficient documentation

## 2012-09-19 MED ORDER — LEVETIRACETAM 500 MG PO TABS
500.0000 mg | ORAL_TABLET | Freq: Once | ORAL | Status: AC
  Administered 2012-09-19: 500 mg via ORAL
  Filled 2012-09-19: qty 1

## 2012-09-19 MED ORDER — LEVETIRACETAM 500 MG PO TABS: 500.0000 mg | ORAL_TABLET | Freq: Two times a day (BID) | ORAL | Status: DC

## 2012-09-19 NOTE — ED Notes (Signed)
[id number]<AV> Expected date:<BR> Expected time:<BR> Means of arrival:<BR> Comments:<BR> seizure

## 2012-09-19 NOTE — ED Notes (Signed)
Pt is coming from home with c/o witnessed seizure. Per EMS family witnessed seizure, per EMS pt has been off of her seizure meds due to job loss. Per EMS pt has hx of seizures. Per EMS pt has MRSA on her right forearm that pt reported  has been treated with antibiotics.

## 2012-09-19 NOTE — ED Provider Notes (Signed)
History     CSN: [id number]  Arrival date & time 09/19/12  1542   First MD Initiated Contact with Patient 09/19/12 1542      Chief Complaint  Patient presents with  . Seizures    (Consider location/radiation/quality/duration/timing/severity/associated sxs/prior treatment) Patient is a 32 y.o. female presenting with seizures.  Seizures  Pt with history of seizure disorder is non-compliant with meds due lack of funds. She had witnessed seizure today by father who called EMS. She was initially post-ictal on EMS arrival, but back to baseline on arrival. She denies any recent illness, but seen at [facility] yesterday for small cutaneous abscess on R forearm that was drained. Started on Bactrim, no fever, swelling or drainage today. She denies tongue injury or incontinence during episode today.   Past Medical History  Diagnosis Date  . Asthma   . Seizures   . Complication of anesthesia     " I have a hard time waking & I cry "  . Depression     panic disorder  . MRSA (methicillin resistant staph aureus) culture positive     Past Surgical History  Procedure Laterality Date  . Tonsillectomy    . Tubal ligation    . Adenoidectomy      History reviewed. No pertinent family history.  History  Substance Use Topics  . Smoking status: Current Every Day Smoker -- 0.25 packs/day  . Smokeless tobacco: Not on file  . Alcohol Use: No    OB History   Grav Para Term Preterm Abortions TAB SAB Ect Mult Living                  Review of Systems  Neurological: Positive for seizures.   All other systems reviewed and are negative except as noted in HPI.   Allergies  Amoxicillin-pot clavulanate; Erythromycin; Vancomycin; Zithromax; and Sudafed  Home Medications   Current Outpatient Rx  Name  Route  Sig  Dispense  Refill  . HYDROcodone-acetaminophen (NORCO) 5-325 MG per tablet   Oral   Take 1 tablet by mouth every 6 (six) hours as needed for pain.   10 tablet   0   . levETIRAcetam  (KEPPRA) 500 MG tablet   Oral   Take 1 tablet (500 mg total) by mouth 2 (two) times daily.   60 tablet   0   . sulfamethoxazole-trimethoprim (BACTRIM DS) 800-160 MG per tablet   Oral   Take 1 tablet by mouth 2 (two) times daily. 10 day course of therapy started 09/18/12.           BP 109/65  Pulse 76  Temp(Src) 98.9 F (37.2 C) (Oral)  Resp 16  SpO2 99%  Physical Exam  Nursing note and vitals reviewed. Constitutional: She is oriented to person, place, and time. She appears well-developed and well-nourished.  HENT:  Head: Normocephalic and atraumatic.  Eyes: EOM are normal. Pupils are equal, round, and reactive to light.  Neck: Normal range of motion. Neck supple.  Cardiovascular: Normal rate, normal heart sounds and intact distal pulses.   Pulmonary/Chest: Effort normal and breath sounds normal.  Abdominal: Bowel sounds are normal. She exhibits no distension. There is no tenderness.  Musculoskeletal: Normal range of motion. She exhibits no edema and no tenderness.  Neurological: She is alert and oriented to person, place, and time. She has normal strength. No cranial nerve deficit or sensory deficit.  Skin: Skin is warm and dry. No rash noted.  Healing cutaneous abscess R forearm without  signs of progressive infection  Psychiatric: She has a normal mood and affect.    ED Course  Procedures (including critical care time)  Labs Reviewed - No data to display No results found.   1. Seizure       MDM  Pt back to baseline now. She was seen by Care Management who has given her an Rx Discount card. Given a PO dose of Keppra here and Rx for same. Advised Neuro followup.        Amanie Mcculley B. Bernette Mayers, MD 09/19/12 1610

## 2012-09-19 NOTE — Progress Notes (Signed)
   CARE MANAGEMENT ED NOTE 09/19/2012  Patient:  [name], [name]   Account Number:  [id number]  Date Initiated:  09/19/2012  Documentation initiated by:  Radford Pax  Subjective/Objective Assessment:   Patient came to ED today after family had witnessed patient having a seizure at home.     Subjective/Objective Assessment Detail:     Action/Plan:   Action/Plan Detail:   Anticipated DC Date:  09/19/2012     Status Recommendation to Physician:   Result of Recommendation:    Other ED Services  Consult Working Plan    DC Planning Services  CM consult  Medication Assistance  Other  Outpatient Services - Pt will follow up    Choice offered to / List presented to:            Status of service:  Completed, signed off  ED Comments:   ED Comments Detail:  EDP asked EDCM to see patient regarding medication assistance.  As per ED RN notes, patient has been off of her seizure medications because of job loss.  Patient was eneterd into [facility] program by CM in November of 2013. Explained to patient and father at bedside that she will not be eligible for the [facility] program until November of 2014.  [facility] gave patient a prescription discount card and the website information for needymeds.org to help obtain medications.  Also, gave patient list of community resources that may help patient afford medications. Patient states that she sees Dr. Anne Hahn in [place] for her seizures.  Patient stated that she has not seen him in at least a year.  Encouraged patient to make appointment with Dr. Anne Hahn.  Patient and family grateful for CM services.  No further needs at this time.

## 2012-09-20 NOTE — ED Provider Notes (Signed)
Medical screening examination/treatment/procedure(s) were performed by non-physician practitioner and as supervising physician I was immediately available for consultation/collaboration.   Gavin Pound. Enolia Koepke, MD 09/20/12 1243

## 2013-07-14 ENCOUNTER — Emergency Department (HOSPITAL_COMMUNITY)
Admission: EM | Admit: 2013-07-14 | Discharge: 2013-07-14 | Disposition: A | Discharge status: Home / Self Care | Payer: Self-pay | Attending: Emergency Medicine | Admitting: Emergency Medicine

## 2013-07-14 ENCOUNTER — Encounter (HOSPITAL_COMMUNITY): Payer: Self-pay | Admitting: Emergency Medicine

## 2013-07-14 DIAGNOSIS — F341 Dysthymic disorder: Secondary | ICD-10-CM | POA: Insufficient documentation

## 2013-07-14 DIAGNOSIS — F17200 Nicotine dependence, unspecified, uncomplicated: Secondary | ICD-10-CM | POA: Insufficient documentation

## 2013-07-14 DIAGNOSIS — L02415 Cutaneous abscess of right lower limb: Secondary | ICD-10-CM

## 2013-07-14 DIAGNOSIS — Z880 Allergy status to penicillin: Secondary | ICD-10-CM | POA: Insufficient documentation

## 2013-07-14 DIAGNOSIS — L03119 Cellulitis of unspecified part of limb: Principal | ICD-10-CM

## 2013-07-14 DIAGNOSIS — L02419 Cutaneous abscess of limb, unspecified: Secondary | ICD-10-CM | POA: Insufficient documentation

## 2013-07-14 DIAGNOSIS — G40909 Epilepsy, unspecified, not intractable, without status epilepticus: Secondary | ICD-10-CM | POA: Insufficient documentation

## 2013-07-14 DIAGNOSIS — Z79899 Other long term (current) drug therapy: Secondary | ICD-10-CM | POA: Insufficient documentation

## 2013-07-14 DIAGNOSIS — Z8614 Personal history of Methicillin resistant Staphylococcus aureus infection: Secondary | ICD-10-CM | POA: Insufficient documentation

## 2013-07-14 DIAGNOSIS — J45909 Unspecified asthma, uncomplicated: Secondary | ICD-10-CM | POA: Insufficient documentation

## 2013-07-14 MED ORDER — CLINDAMYCIN HCL 150 MG PO CAPS: 300.0000 mg | ORAL_CAPSULE | Freq: Three times a day (TID) | ORAL | Status: DC

## 2013-07-14 NOTE — Discharge Instructions (Signed)
Take antibiotics for full dose  Keep area clean and dry  Use warm compresses  Take Ibuprofen as needed for pain  Return to the emergency department if you develop any changing/worsening condition, fever, spreading redness/swelling, repeated vomiting, increased drainage or any other concerns (please read additional information regarding your condition below)   Abscess An abscess is an infected area that contains a collection of pus and debris.It can occur in almost any part of the body. An abscess is also known as a furuncle or boil. CAUSES  An abscess occurs when tissue gets infected. This can occur from blockage of oil or sweat glands, infection of hair follicles, or a minor injury to the skin. As the body tries to fight the infection, pus collects in the area and creates pressure under the skin. This pressure causes pain. People with weakened immune systems have difficulty fighting infections and get certain abscesses more often.  SYMPTOMS Usually an abscess develops on the skin and becomes a painful mass that is red, warm, and tender. If the abscess forms under the skin, you may feel a moveable soft area under the skin. Some abscesses break open (rupture) on their own, but most will continue to get worse without care. The infection can spread deeper into the body and eventually into the bloodstream, causing you to feel ill.  DIAGNOSIS  Your caregiver will take your medical history and perform a physical exam. A sample of fluid may also be taken from the abscess to determine what is causing your infection. TREATMENT  Your caregiver may prescribe antibiotic medicines to fight the infection. However, taking antibiotics alone usually does not cure an abscess. Your caregiver may need to make a small cut (incision) in the abscess to drain the pus. In some cases, gauze is packed into the abscess to reduce pain and to continue draining the area. HOME CARE INSTRUCTIONS   Only take over-the-counter or  prescription medicines for pain, discomfort, or fever as directed by your caregiver.  If you were prescribed antibiotics, take them as directed. Finish them even if you start to feel better.  If gauze is used, follow your caregiver's directions for changing the gauze.  To avoid spreading the infection:  Keep your draining abscess covered with a bandage.  Wash your hands well.  Do not share personal care items, towels, or whirlpools with others.  Avoid skin contact with others.  Keep your skin and clothes clean around the abscess.  Keep all follow-up appointments as directed by your caregiver. SEEK MEDICAL CARE IF:   You have increased pain, swelling, redness, fluid drainage, or bleeding.  You have muscle aches, chills, or a general ill feeling.  You have a fever. MAKE SURE YOU:   Understand these instructions.  Will watch your condition.  Will get help right away if you are not doing well or get worse. Document Released: 01/19/2005 Document Revised: 10/11/2011 Document Reviewed: 06/24/2011 [facility] Patient Information 2014 [place], [place].   Emergency Department Resource Guide 1) Find a Doctor and Pay Out of Pocket Although you won't have to find out who is covered by your insurance plan, it is a good idea to ask around and get recommendations. You will then need to call the office and see if the doctor you have chosen will accept you as a new patient and what types of options they offer for patients who are self-pay. Some doctors offer discounts or will set up payment plans for their patients who do not have insurance, but  you will need to ask so you aren't surprised when you get to your appointment.  2) Contact Your Local Health Department Not all health departments have doctors that can see patients for sick visits, but many do, so it is worth a call to see if yours does. If you don't know where your local health department is, you can check in your phone book. The CDC  also has a tool to help you locate your state's health department, and many state websites also have listings of all of their local health departments.  3) Find a Walk-in Clinic If your illness is not likely to be very severe or complicated, you may want to try a walk in clinic. These are popping up all over the country in pharmacies, drugstores, and shopping centers. They're usually staffed by nurse practitioners or physician assistants that have been trained to treat common illnesses and complaints. They're usually fairly quick and inexpensive. However, if you have serious medical issues or chronic medical problems, these are probably not your best option.  No Primary Care Doctor: - Call Health Connect at  [phone] - they can help you locate a primary care doctor that  accepts your insurance, provides certain services, etc. - Physician Referral Service- [phone]  Chronic Pain Problems: Organization         Address  Phone   Notes  [facility]  [phone] Patients need to be referred by their primary care doctor.   Medication Assistance: Organization         Address  Phone   Notes  [facility] [address] [place], [place] [address] [phone] --Must be a resident of [facility] -- Must have NO insurance coverage whatsoever (no Medicaid/ Medicare, etc.) -- The pt. MUST have a primary care doctor that directs their care regularly and follows them in the community   MedAssist  [phone]   [employer]  [phone]) [phone]    Agencies that provide inexpensive medical care: Organization         Address  Phone   Notes  [facility]  [phone]   [facility]    [phone]   [facility] [address] [place], [place] [address] [phone]   [facility] [address], [place] [phone]   Planned Parenthood    [phone])  [phone]   [facility]    [phone]   Community Health and [facility]  [address], [place] Phone:  [phone], Fax:  [phone] Hours of Operation:  9 am - 6 pm, M-F.  Also accepts Medicaid/Medicare and self-pay.  [facility]  [address], [place] Phone: [phone], Fax: [phone]. Hours of Operation:  8:30 am - 5:30 pm, M-F.  Also accepts Medicaid and self-pay.  [facility] [place] [address], [place] Phone: [phone]   [facility] [address], [place], [place] [phone], Ext. [phone] Mondays & Thursdays: 7-9 AM.  First 15 patients are seen on a first come, first serve basis.    Medicaid-accepting [facility] Providers:  Organization         Address  Phone   Notes  [facility] [address], [place] [phone] Also accepts self-pay patients.  [facility] [address], [place]  [address] [place]  [phone]   [facility] [address], [place] [phone]   [facility] [address], [place] [phone]   Renaye Rakers [address], [place]   [phone] Only accepts [place] Access [place] patients after they have their name applied to their card.   Self-Pay (no insurance) in [facility]:  Organization         Address  Phone   Notes  Sickle Cell Patients, [facility] [address] [place], [place] [phone]   [facility] [address] [place], [place] [phone]   [facility] [place]  [address], [place] [phone]   [facility]/Dr. Osei-Bonsu  [address], [place] or [address], [place] [phone] Phone number for both [place] and [place] locations is the same.  Urgent Medical and [facility] [address], [place] [phone]   [facility] [address], [place] or [address] [phone]   [facility] [address] [place] [phone], phone; [phone], fax Sees patients 1st and 3rd Saturday of every month.  Must not qualify for public or private insurance (i.e. Medicaid, Medicare, [place] Health Choice, Veterans' Benefits)  Household income should be no more than 200% of the poverty level The clinic cannot treat you if you are pregnant or think you are pregnant  Sexually transmitted diseases are not treated at the clinic.    Dental Care: Organization         Address  Phone  Notes  [facility] of [facility] [address] [place], [place] [phone] Accepts children up to age 58 who are enrolled in [place] or [place] Health Choice; pregnant women with a Medicaid card; and children who have applied for Medicaid or [place] Health Choice, but were declined, whose parents can pay a reduced fee at time of service.  [facility] Department of [facility]  [address], [place] [phone] Accepts children up to age 58 who are enrolled in [place] or [place] Health Choice; pregnant women with a Medicaid card; and children who have applied for Medicaid or  Health Choice, but were declined, whose parents can pay a reduced fee at time of service.  [facility]  [address] [place], [place] [phone] Patients are seen by appointment only. Walk-ins are not accepted. [facility] will see patients 60 years of age and older. Monday - Tuesday (8am-5pm) Most Wednesdays (8:30-5pm) $30 per visit, cash only  [facility] Access PRO[facility]  [address], [facility] [phone] Patients are seen by appointment only. Walk-ins are not accepted. [facility] will see patients 62 years of age and older. One  Wednesday Evening (Monthly: Volunteer Based).  $30 per visit, cash only  [employer]  [phone] for adults; Children under age 12, call Graduate Pediatric Dentistry at [phone]. Children aged 33-14, please call [phone] to request a pediatric application.  Dental services are provided in all areas of dental care including fillings, crowns and bridges, complete and partial dentures, implants, gum treatment, root canals, and extractions. Preventive care is also provided. Treatment is provided to both adults and children. Patients are selected via a lottery  and there is often a waiting list.   [facility] [address], [place]  [phone] [email or web address]   [facility] [address] [place], [place] [phone], Ext. [phone] Second and Fourth Thursday of each month, opens at 6:30 AM; Clinic ends at 9 AM.  Patients are seen on a first-come first-served basis, and a limited number are seen during each clinic.   [facility]  [address] [place], [place] [phone]   Eligibility Requirements You must have lived in [place], [place], or [place] counties for at least the last three months.   You cannot be eligible for state or federal sponsored [place], including [employer], [place], or [employer].   You generally cannot be eligible for healthcare insurance through your employer.    How to apply: Eligibility screenings are held every Tuesday and Wednesday afternoon from 1:00 pm until 4:00 pm. You do not need an appointment for the interview!  [facility] [address] [place], [place] [phone]   [facility] Health Department  [phone]   [facility] Health Department  [phone]   [facility] Health Department  [phone]    Behavioral Health Resources in the Community: Intensive Outpatient Programs Organization          Address  Phone  Notes  [facility] [address], [place], [place] [phone]   [facility] [address] [place], [place] [phone]   [facility]: [facility] Drug S[facility] [address], [place], [place]  [phone]   [facility] [address]  [place], [place] [phone] or [phone]   Substance Abuse Resources Organization         Address  Phone  Notes  [facility] Drug Services  [phone]   [facility]  [phone]   [facility]  [phone]   Floydene Flock  [phone]   Residential & Outpatient Substance Abuse Program  [phone]   Psychological Services Organization         Address  Phone  Notes  [facility]  [phone]   [facility]  [phone]   [facility] [address] [place] [phone] or [phone]    Mobile Crisis Teams Organization         Address  Phone  Notes  [facility], Mobile Crisis Care Unit  [phone]   [facility]  [address] [place], [place] [phone]   Doristine Locks [address] [place] [phone]    Self-Help/Support Groups Organization         Address  Phone             Notes  [facility] of [place] - variety of support groups  [phone]- [phone] Call for more information  Narcotics Anonymous (NA), [facility] [address], [employer] [place]  2 meetings at this location   Statistician         Address  Phone  Notes  [facility] [address],    [place]  [phone]   [facility]  [address], [place] [address], [place], [place] [phone]   [facility] [address] [place], [place] [phone] Admissions: 8am-3pm M-F  [facility] [address]    [place], [place] [phone]   [facility] [address] [place] [address], [place],  [place] [phone]   [facility] [address]  [place], [place]   [facility] - Intensive Outpatient [address], [place], [place]   [facility] [place].,  [place], [place] [phone] or [phone]   [facility] Services (RTS) [address] [place], [place] Accepts Medicaid  [facility] [place] [address],  [place] [phone] Substance Abuse/Addiction Treatment   [facility] Organization         Address  Phone  Notes  [facility]  [phone]   Domenic Schwab, PhD [address]lis Porta [place], [place]   [phone] or [phone]   [facility] [place], [place] [phone]   [address] [place], [place] [phone] Insurance/Medicaid/sponsorship through [facility] and Families [address]                                    [place], [place] [phone] [place] [address], [place] [phone]    Dr. Adele Schilder  [phone]   [facility] Dept. 1) [address] [place] 2) [place] 3)  [address], [place] [phone]  [phone]   [place] [phone] or [phone] (After Hours)

## 2013-07-14 NOTE — ED Notes (Signed)
Pt c/o reddened area right upper extremity near knee.  Area approx 2.5 cm round with pink  extending out from area.  Has hx of previous areas of same that have hospitalized her.

## 2013-07-14 NOTE — ED Provider Notes (Signed)
CSN: [id number]     Arrival date & time 07/14/13  2113 History   This chart was scribed for non-physician practitioner, Coral Ceo, PA-C, working with Flint Melter, MD by Smiley Houseman, ED Scribe. This patient was seen in room TR08C/TR08C and the patient's care was started at 10:05 PM.    Chief Complaint  Patient presents with  . Recurrent Skin Infections   The history is provided by the patient. No language interpreter was used.   HPI Comments: [name] is a 33 y.o. 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 Pt complains of an abscess on her right distal anterior thigh onset three days ago. Denies previous wound or trauma. Pt states the area drained minimal white pus PTA. Hx of abscesses, however, not in this location. Area of redness has been gradually worsening over the past few days. Pt denies fever, chills, fatigue and emesis. She states she hasn't seen anyone for this abscess prior to today's visit.  Pt had a prior abscess on her forearm in May 2014.  Pt states her tetanus is UTD. No hx of DM. No IV drug use.    Past Medical History  Diagnosis Date  . Asthma   . Seizures   . Complication of anesthesia     " I have a hard time waking & I cry "  . Depression     panic disorder  . MRSA (methicillin resistant staph aureus) culture positive    Past Surgical History  Procedure Laterality Date  . Tonsillectomy    . Tubal ligation    . Adenoidectomy     No family history on file. History  Substance Use Topics  . Smoking status: Current Every Day Smoker -- 0.25 packs/day  . Smokeless tobacco: Not on file  . Alcohol Use: No   OB History   Grav Para Term Preterm Abortions TAB SAB Ect Mult Living                 Review of Systems  Constitutional: Negative for fever and chills.  Respiratory: Negative for chest tightness and shortness of breath.   Gastrointestinal: Negative for nausea, vomiting, abdominal pain  and diarrhea.  Musculoskeletal: Negative for myalgias.  Skin: Positive for wound (Right anterior thigh). Negative for color change and rash.  Psychiatric/Behavioral: Negative for behavioral problems and confusion.  All other systems reviewed and are negative.    Allergies  Vancomycin; Amoxicillin-pot clavulanate; Erythromycin; Sudafed; and Zithromax  Home Medications   Current Outpatient Rx  Name  Route  Sig  Dispense  Refill  . levETIRAcetam (KEPPRA) 500 MG tablet   Oral   Take 1 tablet (500 mg total) by mouth 2 (two) times daily.   60 tablet   0    Triage Vitals: BP 117/70  Pulse 88  Temp(Src) 97.6 F (36.4 C) (Oral)  Resp 14  Ht 5\' 7"  (1.702 m)  Wt 128 lb (58.06 kg)  BMI 20.04 kg/m2  SpO2 98%  LMP 07/01/2013  Filed Vitals:   07/14/13 2117 07/14/13 2231  BP: 117/70 106/69  Pulse: 88 104  Temp: 97.6 F (36.4 C) 98.5 F (36.9 C)  TempSrc: Oral Oral  Resp: 14 18  Height: 5\' 7"  (1.702 m)   Weight: 128 lb (58.06 kg)   SpO2: 98% 100%    Physical Exam  Nursing note and vitals reviewed. Constitutional: She is oriented to person, place, and time. She appears well-developed and well-nourished. No  distress.  HENT:  Head: Normocephalic and atraumatic.  Eyes: Conjunctivae and EOM are normal. Right eye exhibits no discharge. Left eye exhibits no discharge.  Neck: Neck supple. No tracheal deviation present.  Cardiovascular: Normal rate.   Pulmonary/Chest: Effort normal. No respiratory distress.  Musculoskeletal: Normal range of motion. She exhibits no edema and no tenderness.       Legs: No LE edema or calf tenderness bilaterally  Neurological: She is alert and oriented to person, place, and time.  Skin: Skin is warm and dry. She is not diaphoretic.  3 cm circular area of erythema to the distal anterior right thigh with a pinpoint open laceration in the center. No drainage or bleeding. Minimal induration with no fluctuance.   Psychiatric: She has a normal mood and  affect. Her behavior is normal.    ED Course  INCISION AND DRAINAGE Date/Time: 07/14/2013 10:20 PM Performed by: Coral CeoPALMER, Janazia Schreier K Authorized by: Jillyn LedgerPALMER, Kathalene Sporer K Consent: Verbal consent obtained. Consent given by: patient Patient identity confirmed: verbally with patient Type: abscess Body area: lower extremity Location details: right leg Anesthesia: local infiltration Local anesthetic: lidocaine 2% without epinephrine Anesthetic total: 1 ml Scalpel size: 11 Incision type: single straight Complexity: simple Drainage: serosanguinous,  purulent and  bloody Drainage amount: scant Wound treatment: wound left open Patient tolerance: Patient tolerated the procedure well with no immediate complications. Comments: Antibiotic ointment and bandage applied   (including critical care time) DIAGNOSTIC STUDIES: Oxygen Saturation is 98% on RA, normal by my interpretation.    COORDINATION OF CARE: 10:10 PM-Will attempt to drain the abscess.  Patient informed of current plan of treatment and evaluation and agrees with plan.    10:27 PM - Drained abscess.  Will discharge with clindamycin.     MDM   [name] is a 33 y.o. 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 Patient likely has a developing abscess vs cellulitis to her right distal anterior thigh. Area I&D'd in the ED with minimal drainage. Patient has hx of recurrent abscesses with MRSA. Patient afebrile and non-toxic in appearance. Will discharge patient on clindamycin. Tetanus up to date. Wound care discussed with patient. Return precautions, discharge instructions, and follow-up was discussed with the patient before discharge.     Discharge Medication List as of 07/14/2013 10:29 PM    START taking these medications   Details  clindamycin (CLEOCIN) 150 MG capsule Take 2 capsules (300 mg total) by mouth 3 (three) times daily. May dispense as 150mg  capsules, Starting  07/14/2013, Until Discontinued, Print         Final impressions: 1. Abscess of right thigh       Thomasenia SalesJessica Katlin Agapito Hanway PA-C   I personally performed the services described in this documentation, which was scribed in my presence. The recorded information has been reviewed and is accurate.      Jillyn LedgerJessica K Latasia Silberstein, PA-C 07/15/13 1251

## 2013-07-14 NOTE — ED Notes (Signed)
C/o recurrent skin infection, h/o MRSA. Pinpoints most recent abscess to T distal anterior thigh. Redness noted. Reports some creamy pus when she squeezed it. First noticed 3d ago. Not taking any abx at this time. Last skin infection issue was ~ 9 months ago.

## 2013-07-15 NOTE — ED Provider Notes (Signed)
Medical screening examination/treatment/procedure(s) were performed by non-physician practitioner and as supervising physician I was immediately available for consultation/collaboration.   EKG Interpretation None       Angelicia Lessner L Belissa Kooy, MD 07/15/13 2116 

## 2013-08-17 ENCOUNTER — Emergency Department (HOSPITAL_COMMUNITY)
Admission: EM | Admit: 2013-08-17 | Discharge: 2013-08-17 | Disposition: A | Discharge status: Home / Self Care | Payer: Self-pay | Attending: Emergency Medicine | Admitting: Emergency Medicine

## 2013-08-17 ENCOUNTER — Encounter (HOSPITAL_COMMUNITY): Payer: Self-pay | Admitting: Emergency Medicine

## 2013-08-17 DIAGNOSIS — Z79899 Other long term (current) drug therapy: Secondary | ICD-10-CM | POA: Insufficient documentation

## 2013-08-17 DIAGNOSIS — F3289 Other specified depressive episodes: Secondary | ICD-10-CM | POA: Insufficient documentation

## 2013-08-17 DIAGNOSIS — Z888 Allergy status to other drugs, medicaments and biological substances status: Secondary | ICD-10-CM | POA: Insufficient documentation

## 2013-08-17 DIAGNOSIS — K029 Dental caries, unspecified: Secondary | ICD-10-CM

## 2013-08-17 DIAGNOSIS — R569 Unspecified convulsions: Secondary | ICD-10-CM | POA: Insufficient documentation

## 2013-08-17 DIAGNOSIS — Q759 Congenital malformation of skull and face bones, unspecified: Secondary | ICD-10-CM | POA: Insufficient documentation

## 2013-08-17 DIAGNOSIS — Z87891 Personal history of nicotine dependence: Secondary | ICD-10-CM | POA: Insufficient documentation

## 2013-08-17 DIAGNOSIS — F329 Major depressive disorder, single episode, unspecified: Secondary | ICD-10-CM | POA: Insufficient documentation

## 2013-08-17 DIAGNOSIS — Z22322 Carrier or suspected carrier of Methicillin resistant Staphylococcus aureus: Secondary | ICD-10-CM | POA: Insufficient documentation

## 2013-08-17 DIAGNOSIS — J45909 Unspecified asthma, uncomplicated: Secondary | ICD-10-CM | POA: Insufficient documentation

## 2013-08-17 MED ORDER — HYDROCODONE-ACETAMINOPHEN 5-325 MG PO TABS: 1.0000 | ORAL_TABLET | Freq: Four times a day (QID) | ORAL | Status: DC | PRN

## 2013-08-17 MED ORDER — PENICILLIN V POTASSIUM 500 MG PO TABS: 500.0000 mg | ORAL_TABLET | Freq: Three times a day (TID) | ORAL | Status: DC

## 2013-08-17 NOTE — ED Provider Notes (Signed)
CSN: [id number]     Arrival date & time 08/17/13  2029 History   First MD Initiated Contact with Patient 08/17/13 2037  This chart was scribed for non-physician practitioner Fayrene HelperBowie Brently Voorhis, PA-C working with Gavin PoundMichael Y. Oletta LamasGhim, MD by Valera CastleSteven Perry, ED scribe. This patient was seen in room TR07C/TR07C and the patient's care was started at 9:04 PM.    Chief Complaint  Patient presents with  . Dental Pain   (Consider location/radiation/quality/duration/timing/severity/associated sxs/prior Treatment) The history is provided by the patient. No language interpreter was used.   HPI Comments: [name] is a 33 y.o. female who presents to the Emergency Department complaining of gradually worsening, constant, throbbing, left, lower dental pain, onset 3 days ago, that radiates to her upper jaw, cheek, and left ear. She reports that cold and hot liquids exacerbates her jaw pain. She states she has not been able to eat due to her pain. She has been taking Ibuprofen and Aleve without relief. She denies fever, and any other associated symptoms. She reports quitting smoking 2 months ago, 05/2013. She reports allergy to Vancomycin, Amoxacillin Clavulanate, Erythromycin, Zithromax, and Sudafed. She denies having dentist.   PCP - No primary provider on file.  Past Medical History  Diagnosis Date  . Asthma   . Seizures   . Complication of anesthesia     " I have a hard time waking & I cry "  . Depression     panic disorder  . MRSA (methicillin resistant staph aureus) culture positive    Past Surgical History  Procedure Laterality Date  . Tonsillectomy    . Tubal ligation    . Adenoidectomy     No family history on file. History  Substance Use Topics  . Smoking status: Former Smoker -- 0.25 packs/day    Types: Cigarettes    Quit date: 06/18/2013  . Smokeless tobacco: Not on file  . Alcohol Use: No   OB History   Grav Para Term Preterm Abortions TAB SAB Ect Mult Living                 Review of  Systems  Constitutional: Negative for fever.  HENT: Positive for dental problem (left, lower jaw pain, abscessed tooth).     Allergies  Vancomycin; Amoxicillin-pot clavulanate; Erythromycin; Sudafed; and Zithromax  Home Medications   Prior to Admission medications   Medication Sig Start Date End Date Taking? Authorizing Provider  levETIRAcetam (KEPPRA) 500 MG tablet Take 1 tablet (500 mg total) by mouth 2 (two) times daily. 09/19/12  Yes Charles B. Bernette MayersSheldon, MD  clindamycin (CLEOCIN) 150 MG capsule Take 2 capsules (300 mg total) by mouth 3 (three) times daily. May dispense as 150mg  capsules 07/14/13   Janene HarveyJessica K Palmer, PA-C   BP 134/87  Pulse 112  Temp(Src) 97.7 F (36.5 C) (Oral)  Resp 20  Wt 135 lb 3 oz (61.321 kg)  SpO2 100%  Physical Exam  Nursing note and vitals reviewed. Constitutional: She is oriented to person, place, and time. She appears well-developed and well-nourished. No distress.  HENT:  Head: Atraumatic. Macrocephalic.  Right Ear: External ear normal.  Left Ear: Tympanic membrane, external ear and ear canal normal.  Mouth/Throat: Uvula is midline, oropharynx is clear and moist and mucous membranes are normal. No trismus in the jaw. Dental caries present. No oropharyngeal exudate.    Significant dental decay noted to tooth #18. No obvious abscess noted. No trismus.   Eyes: EOM are normal.  Neck: Neck supple. No  thyromegaly present.  Cardiovascular: Normal rate.   Pulmonary/Chest: Effort normal. No respiratory distress.  Musculoskeletal: Normal range of motion.  Lymphadenopathy:    She has no cervical adenopathy.  Neurological: She is alert and oriented to person, place, and time.  Skin: Skin is warm and dry.  Psychiatric: She has a normal mood and affect. Her behavior is normal.    ED Course  Procedures (including critical care time)  DIAGNOSTIC STUDIES: Oxygen Saturation is 100% on room air, normal by my interpretation.    COORDINATION OF CARE: 9:09  PM-Discussed treatment plan which includes an antibiotic with pt at bedside and pt agreed to plan.   Labs Review Labs Reviewed - No data to display  Imaging Review No results found.   EKG Interpretation None     Medications - No data to display MDM   Final diagnoses:  Pain due to dental caries    BP 134/87  Pulse 112  Temp(Src) 97.7 F (36.5 C) (Oral)  Resp 20  Wt 135 lb 3 oz (61.321 kg)  SpO2 100% elevated heart rate 2/2 pain  I personally performed the services described in this documentation, which was scribed in my presence. The recorded information has been reviewed and is accurate.     Fayrene HelperBowie Ashelyn Mccravy, PA-C 08/17/13 2114

## 2013-08-17 NOTE — ED Notes (Signed)
Pt sattes left lower jaw pain for three days from an abscessed tooth

## 2013-08-17 NOTE — Discharge Instructions (Signed)
Dental Caries   Dental caries (also called tooth decay) is the most common oral disease. It can occur at any age, but is more common in children and young adults.   HOW DENTAL CARIES DEVELOPS   The process of decay begins when bacteria and foods (particularly sugars and starches) combine in your mouth to produce plaque. Plaque is a substance that sticks to the hard, outer surface of a tooth (enamel). The bacteria in plaque produce acids that attack enamel. These acids may also attack the root surface of a tooth (cementum) if it is exposed. Repeated attacks dissolve these surfaces and create holes in the tooth (cavities). If left untreated, the acids destroy the other layers of the tooth.   RISK FACTORS  · Frequent sipping of sugary beverages.    · Frequent snacking on sugary and starchy foods, especially those that easily get stuck in the teeth.    · Poor oral hygiene.    · Dry mouth.    · Substance abuse such as methamphetamine abuse.    · Broken or poor-fitting dental restorations.    · Eating disorders.    · Gastroesophageal reflux disease (GERD).    · Certain radiation treatments to the head and neck.  SYMPTOMS  In the early stages of dental caries, symptoms are seldom present. Sometimes white, chalky areas may be seen on the enamel or other tooth layers. In later stages, symptoms may include:  · Pits and holes on the enamel.  · Toothache after sweet, hot, or cold foods or drinks are consumed.  · Pain around the tooth.  · Swelling around the tooth.  DIAGNOSIS   Most of the time, dental caries is detected during a regular dental checkup. A diagnosis is made after a thorough medical and dental history is taken and the surfaces of your teeth are checked for signs of dental caries. Sometimes special instruments, such as lasers, are used to check for dental caries. Dental X-ray exams may be taken so that areas not visible to the eye (such as between the contact areas of the teeth) can be checked for cavities.    TREATMENT   If dental caries is in its early stages, it may be reversed with a fluoride treatment or an application of a remineralizing agent at the dental office. Thorough brushing and flossing at home is needed to aid these treatments. If it is in its later stages, treatment depends on the location and extent of tooth destruction:   · If a small area of the tooth has been destroyed, the destroyed area will be removed and cavities will be filled with a material such as gold, silver amalgam, or composite resin.    · If a large area of the tooth has been destroyed, the destroyed area will be removed and a cap (crown) will be fitted over the remaining tooth structure.    · If the center part of the tooth (pulp) is affected, a procedure called a root canal will be needed before a filling or crown can be placed.    · If most of the tooth has been destroyed, the tooth may need to be pulled (extracted).  HOME CARE INSTRUCTIONS  You can prevent, stop, or reverse dental caries at home by practicing good oral hygiene. Good oral hygiene includes:  · Thoroughly cleaning your teeth at least twice a day with a toothbrush and dental floss.    · Using a fluoride toothpaste. A fluoride mouth rinse may also be used if recommended by your dentist or health care provider.    ·   Restricting the amount of sugary and starchy foods and sugary liquids you consume.    · Avoiding frequent snacking on these foods and sipping of these liquids.    · Keeping regular visits with a dentist for checkups and cleanings.  PREVENTION   · Practice good oral hygiene.  · Consider a dental sealant. A dental sealant is a coating material that is applied by your dentist to the pits and grooves of teeth. The sealant prevents food from being trapped in them. It may protect the teeth for several years.  · Ask about fluoride supplements if you live in a community without fluorinated water or with water that has a low fluoride content. Use fluoride supplements  as directed by your dentist or health care provider.  · Allow fluoride varnish applications to teeth if directed by your dentist or health care provider.  Document Released: 01/01/2002 Document Revised: 12/12/2012 Document Reviewed: 04/13/2012  [employer]® Patient Information ©2014 [employer].

## 2013-08-18 NOTE — ED Provider Notes (Signed)
Medical screening examination/treatment/procedure(s) were performed by non-physician practitioner and as supervising physician I was immediately available for consultation/collaboration.  Callahan Wild Y. Pema Thomure, MD 08/18/13 0039 

## 2013-10-17 ENCOUNTER — Encounter (HOSPITAL_COMMUNITY): Payer: Self-pay | Admitting: Emergency Medicine

## 2013-10-17 DIAGNOSIS — F329 Major depressive disorder, single episode, unspecified: Secondary | ICD-10-CM | POA: Insufficient documentation

## 2013-10-17 DIAGNOSIS — R1032 Left lower quadrant pain: Secondary | ICD-10-CM | POA: Insufficient documentation

## 2013-10-17 DIAGNOSIS — Z880 Allergy status to penicillin: Secondary | ICD-10-CM | POA: Insufficient documentation

## 2013-10-17 DIAGNOSIS — Z9851 Tubal ligation status: Secondary | ICD-10-CM | POA: Insufficient documentation

## 2013-10-17 DIAGNOSIS — J45909 Unspecified asthma, uncomplicated: Secondary | ICD-10-CM | POA: Insufficient documentation

## 2013-10-17 DIAGNOSIS — G40909 Epilepsy, unspecified, not intractable, without status epilepticus: Secondary | ICD-10-CM | POA: Insufficient documentation

## 2013-10-17 DIAGNOSIS — F3289 Other specified depressive episodes: Secondary | ICD-10-CM | POA: Insufficient documentation

## 2013-10-17 DIAGNOSIS — Z79899 Other long term (current) drug therapy: Secondary | ICD-10-CM | POA: Insufficient documentation

## 2013-10-17 DIAGNOSIS — Z8614 Personal history of Methicillin resistant Staphylococcus aureus infection: Secondary | ICD-10-CM | POA: Insufficient documentation

## 2013-10-17 DIAGNOSIS — Z3202 Encounter for pregnancy test, result negative: Secondary | ICD-10-CM | POA: Insufficient documentation

## 2013-10-17 DIAGNOSIS — Z792 Long term (current) use of antibiotics: Secondary | ICD-10-CM | POA: Insufficient documentation

## 2013-10-17 DIAGNOSIS — Z9889 Other specified postprocedural states: Secondary | ICD-10-CM | POA: Insufficient documentation

## 2013-10-17 NOTE — ED Notes (Signed)
Pt with lower abd pain that radiates up and to right for couple of days, + nausea, denies vomiting and diarrhea, states hard to swallow saliva

## 2013-10-18 ENCOUNTER — Emergency Department (HOSPITAL_COMMUNITY): Payer: Self-pay

## 2013-10-18 ENCOUNTER — Emergency Department (HOSPITAL_COMMUNITY)
Admission: EM | Admit: 2013-10-18 | Discharge: 2013-10-18 | Disposition: A | Discharge status: Home / Self Care | Payer: Self-pay | Attending: Emergency Medicine | Admitting: Emergency Medicine

## 2013-10-18 DIAGNOSIS — R1032 Left lower quadrant pain: Secondary | ICD-10-CM

## 2013-10-18 LAB — CBC WITH DIFFERENTIAL/PLATELET
BASOS ABS: 0 10*3/uL (ref 0.0–0.1)
BASOS PCT: 0 % (ref 0–1)
EOS PCT: 4 % (ref 0–5)
Eosinophils Absolute: 0.5 10*3/uL (ref 0.0–0.7)
HEMATOCRIT: 39.4 % (ref 36.0–46.0)
Hemoglobin: 13 g/dL (ref 12.0–15.0)
Lymphocytes Relative: 23 % (ref 12–46)
Lymphs Abs: 2.7 10*3/uL (ref 0.7–4.0)
MCH: 28.6 pg (ref 26.0–34.0)
MCHC: 33 g/dL (ref 30.0–36.0)
MCV: 86.6 fL (ref 78.0–100.0)
MONO ABS: 0.7 10*3/uL (ref 0.1–1.0)
Monocytes Relative: 6 % (ref 3–12)
NEUTROS ABS: 8 10*3/uL — AB (ref 1.7–7.7)
Neutrophils Relative %: 67 % (ref 43–77)
PLATELETS: 231 10*3/uL (ref 150–400)
RBC: 4.55 MIL/uL (ref 3.87–5.11)
RDW: 14.6 % (ref 11.5–15.5)
WBC: 11.9 10*3/uL — ABNORMAL HIGH (ref 4.0–10.5)

## 2013-10-18 LAB — PREGNANCY, URINE: PREG TEST UR: NEGATIVE

## 2013-10-18 LAB — URINALYSIS, ROUTINE W REFLEX MICROSCOPIC
Bilirubin Urine: NEGATIVE
Glucose, UA: NEGATIVE mg/dL
HGB URINE DIPSTICK: NEGATIVE
Ketones, ur: NEGATIVE mg/dL
LEUKOCYTES UA: NEGATIVE
NITRITE: NEGATIVE
PROTEIN: NEGATIVE mg/dL
SPECIFIC GRAVITY, URINE: 1.02 (ref 1.005–1.030)
UROBILINOGEN UA: 0.2 mg/dL (ref 0.0–1.0)
pH: 6.5 (ref 5.0–8.0)

## 2013-10-18 LAB — COMPREHENSIVE METABOLIC PANEL
ALBUMIN: 4 g/dL (ref 3.5–5.2)
ALT: 9 U/L (ref 0–35)
AST: 18 U/L (ref 0–37)
Alkaline Phosphatase: 42 U/L (ref 39–117)
BUN: 23 mg/dL (ref 6–23)
CALCIUM: 9.3 mg/dL (ref 8.4–10.5)
CHLORIDE: 98 meq/L (ref 96–112)
CO2: 28 mEq/L (ref 19–32)
CREATININE: 0.67 mg/dL (ref 0.50–1.10)
GFR calc Af Amer: >90 mL/min (ref >90)
GFR calc non Af Amer: >90 mL/min (ref >90)
Glucose, Bld: 102 mg/dL — ABNORMAL HIGH (ref 70–99)
Potassium: 4.1 mEq/L (ref 3.7–5.3)
SODIUM: 138 meq/L (ref 137–147)
Total Bilirubin: 0.2 mg/dL — ABNORMAL LOW (ref 0.3–1.2)
Total Protein: 7.7 g/dL (ref 6.0–8.3)

## 2013-10-18 LAB — LIPASE, BLOOD: Lipase: 19 U/L (ref 11–59)

## 2013-10-18 MED ORDER — SODIUM CHLORIDE 0.9 % IV SOLN
1000.0000 mL | Freq: Once | INTRAVENOUS | Status: AC
  Administered 2013-10-18: 1000 mL via INTRAVENOUS

## 2013-10-18 MED ORDER — METOCLOPRAMIDE HCL 10 MG PO TABS: 10.0000 mg | ORAL_TABLET | Freq: Four times a day (QID) | ORAL | Status: DC | PRN

## 2013-10-18 MED ORDER — HYDROMORPHONE HCL PF 1 MG/ML IJ SOLN
1.0000 mg | Freq: Once | INTRAMUSCULAR | Status: AC
  Administered 2013-10-18: 1 mg via INTRAVENOUS
  Filled 2013-10-18: qty 1

## 2013-10-18 MED ORDER — OXYCODONE-ACETAMINOPHEN 5-325 MG PO TABS: 1.0000 | ORAL_TABLET | ORAL | Status: DC | PRN

## 2013-10-18 MED ORDER — SODIUM CHLORIDE 0.9 % IV SOLN
1000.0000 mL | INTRAVENOUS | Status: DC
  Administered 2013-10-18: 1000 mL via INTRAVENOUS

## 2013-10-18 MED ORDER — ONDANSETRON HCL 4 MG/2ML IJ SOLN
4.0000 mg | Freq: Once | INTRAMUSCULAR | Status: AC
  Administered 2013-10-18: 4 mg via INTRAVENOUS
  Filled 2013-10-18: qty 2

## 2013-10-18 NOTE — ED Notes (Signed)
Discharge instructions given and reviewed with patient.  Prescriptions given for Percocet and Reglan; effects and use explained.  Patient verbalized understanding of sedating effects of medication and to follow up with PMD as needed.  Patient ambulatory; discharged home in good condition.

## 2013-10-18 NOTE — Discharge Instructions (Signed)
Your evaluation did not show the reason for your pain. Please return if symptoms seem to be getting worse. Abdominal Pain Many things can cause abdominal pain. Usually, abdominal pain is not caused by a disease and will improve without treatment. It can often be observed and treated at home. Your health care provider will do a physical exam and possibly order blood tests and X-rays to help determine the seriousness of your pain. However, in many cases, more time must pass before a clear cause of the pain can be found. Before that point, your health care provider may not know if you need more testing or further treatment. HOME CARE INSTRUCTIONS  Monitor your abdominal pain for any changes. The following actions may help to alleviate any discomfort you are experiencing:  Only take over-the-counter or prescription medicines as directed by your health care provider.  Do not take laxatives unless directed to do so by your health care provider.  Try a clear liquid diet (broth, tea, or water) as directed by your health care provider. Slowly move to a bland diet as tolerated. SEEK MEDICAL CARE IF:  You have unexplained abdominal pain.  You have abdominal pain associated with nausea or diarrhea.  You have pain when you urinate or have a bowel movement.  You experience abdominal pain that wakes you in the night.  You have abdominal pain that is worsened or improved by eating food.  You have abdominal pain that is worsened with eating fatty foods.  You have a fever. SEEK IMMEDIATE MEDICAL CARE IF:   Your pain does not go away within 2 hours.  You keep throwing up (vomiting).  Your pain is felt only in portions of the abdomen, such as the right side or the left lower portion of the abdomen.  You pass bloody or black tarry stools. MAKE SURE YOU:  Understand these instructions.   Will watch your condition.   Will get help right away if you are not doing well or get worse.  Document  Released: 01/19/2005 Document Revised: 04/16/2013 Document Reviewed: 12/19/2012 [facility] Patient Information 2015 [place], [place]. This information is not intended to replace advice given to you by your health care provider. Make sure you discuss any questions you have with your health care provider.  Acetaminophen; Oxycodone tablets What is this medicine? ACETAMINOPHEN; OXYCODONE (a set a MEE noe fen; ox i KOE done) is a pain reliever. It is used to treat mild to moderate pain. This medicine may be used for other purposes; ask your health care provider or pharmacist if you have questions. COMMON BRAND NAME(S): Endocet, Magnacet, Narvox, Percocet, Perloxx, Primalev, Primlev, Roxicet, Xolox What should I tell my health care provider before I take this medicine? They need to know if you have any of these conditions: -brain tumor -Crohn's disease, inflammatory bowel disease, or ulcerative colitis -drug abuse or addiction -head injury -heart or circulation problems -if you often drink alcohol -kidney disease or problems going to the bathroom -liver disease -lung disease, asthma, or breathing problems -an unusual or allergic reaction to acetaminophen, oxycodone, other opioid analgesics, other medicines, foods, dyes, or preservatives -pregnant or trying to get pregnant -breast-feeding How should I use this medicine? Take this medicine by mouth with a full glass of water. Follow the directions on the prescription label. Take your medicine at regular intervals. Do not take your medicine more often than directed. Talk to your pediatrician regarding the use of this medicine in children. Special care may be needed. Patients over  85 years old may have a stronger reaction and need a smaller dose. Overdosage: If you think you have taken too much of this medicine contact a poison control center or emergency room at once. NOTE: This medicine is only for you. Do not share this medicine with others. What  if I miss a dose? If you miss a dose, take it as soon as you can. If it is almost time for your next dose, take only that dose. Do not take double or extra doses. What may interact with this medicine? -alcohol -antihistamines -barbiturates like amobarbital, butalbital, butabarbital, methohexital, pentobarbital, phenobarbital, thiopental, and secobarbital -benztropine -drugs for bladder problems like solifenacin, trospium, oxybutynin, tolterodine, hyoscyamine, and methscopolamine -drugs for breathing problems like ipratropium and tiotropium -drugs for certain stomach or intestine problems like propantheline, homatropine methylbromide, glycopyrrolate, atropine, belladonna, and dicyclomine -general anesthetics like etomidate, ketamine, nitrous oxide, propofol, desflurane, enflurane, halothane, isoflurane, and sevoflurane -medicines for depression, anxiety, or psychotic disturbances -medicines for sleep -muscle relaxants -naltrexone -narcotic medicines (opiates) for pain -phenothiazines like perphenazine, thioridazine, chlorpromazine, mesoridazine, fluphenazine, prochlorperazine, promazine, and trifluoperazine -scopolamine -tramadol -trihexyphenidyl This list may not describe all possible interactions. Give your health care provider a list of all the medicines, herbs, non-prescription drugs, or dietary supplements you use. Also tell them if you smoke, drink alcohol, or use illegal drugs. Some items may interact with your medicine. What should I watch for while using this medicine? Tell your doctor or health care professional if your pain does not go away, if it gets worse, or if you have new or a different type of pain. You may develop tolerance to the medicine. Tolerance means that you will need a higher dose of the medication for pain relief. Tolerance is normal and is expected if you take this medicine for a long time. Do not suddenly stop taking your medicine because you may develop a severe  reaction. Your body becomes used to the medicine. This does NOT mean you are addicted. Addiction is a behavior related to getting and using a drug for a non-medical reason. If you have pain, you have a medical reason to take pain medicine. Your doctor will tell you how much medicine to take. If your doctor wants you to stop the medicine, the dose will be slowly lowered over time to avoid any side effects. You may get drowsy or dizzy. Do not drive, use machinery, or do anything that needs mental alertness until you know how this medicine affects you. Do not stand or sit up quickly, especially if you are an older patient. This reduces the risk of dizzy or fainting spells. Alcohol may interfere with the effect of this medicine. Avoid alcoholic drinks. There are different types of narcotic medicines (opiates) for pain. If you take more than one type at the same time, you may have more side effects. Give your health care provider a list of all medicines you use. Your doctor will tell you how much medicine to take. Do not take more medicine than directed. Call emergency for help if you have problems breathing. The medicine will cause constipation. Try to have a bowel movement at least every 2 to 3 days. If you do not have a bowel movement for 3 days, call your doctor or health care professional. Do not take Tylenol (acetaminophen) or medicines that have acetaminophen with this medicine. Too much acetaminophen can be very dangerous. Many nonprescription medicines contain acetaminophen. Always read the labels carefully to avoid taking more acetaminophen. What side effects  may I notice from receiving this medicine? Side effects that you should report to your doctor or health care professional as soon as possible: -allergic reactions like skin rash, itching or hives, swelling of the face, lips, or tongue -breathing difficulties, wheezing -confusion -light headedness or fainting spells -severe stomach  pain -unusually weak or tired -yellowing of the skin or the whites of the eyes Side effects that usually do not require medical attention (report to your doctor or health care professional if they continue or are bothersome): -dizziness -drowsiness -nausea -vomiting This list may not describe all possible side effects. Call your doctor for medical advice about side effects. You may report side effects to FDA at 1-800-FDA-1088. Where should I keep my medicine? Keep out of the reach of children. This medicine can be abused. Keep your medicine in a safe place to protect it from theft. Do not share this medicine with anyone. Selling or giving away this medicine is dangerous and against the law. Store at room temperature between 20 and 25 degrees C (68 and 77 degrees F). Keep container tightly closed. Protect from light. This medicine may cause accidental overdose and death if it is taken by other adults, children, or pets. Flush any unused medicine down the toilet to reduce the chance of harm. Do not use the medicine after the expiration date. NOTE: This sheet is a summary. It may not cover all possible information. If you have questions about this medicine, talk to your doctor, pharmacist, or health care provider.  2015, [employer]vier/[employer] Standard. (2012-12-03 13:17:35)  Metoclopramide tablets What is this medicine? METOCLOPRAMIDE (met oh kloe PRA mide) is used to treat the symptoms of gastroesophageal reflux disease (GERD) like heartburn. It is also used to treat people with slow emptying of the stomach and intestinal tract. This medicine may be used for other purposes; ask your health care provider or pharmacist if you have questions. COMMON BRAND NAME(S): Reglan What should I tell my health care provider before I take this medicine? They need to know if you have any of these conditions: -breast cancer -depression -diabetes -heart failure -high blood pressure -kidney disease -liver  disease -Parkinson's disease or a movement disorder -pheochromocytoma -seizures -stomach obstruction, bleeding, or perforation -an unusual or allergic reaction to metoclopramide, procainamide, sulfites, other medicines, foods, dyes, or preservatives -pregnant or trying to get pregnant -breast-feeding How should I use this medicine? Take this medicine by mouth with a glass of water. Follow the directions on the prescription label. Take this medicine on an empty stomach, about 30 minutes before eating. Take your doses at regular intervals. Do not take your medicine more often than directed. Do not stop taking except on the advice of your doctor or health care professional. A special MedGuide will be given to you by the pharmacist with each prescription and refill. Be sure to read this information carefully each time. Talk to your pediatrician regarding the use of this medicine in children. Special care may be needed. Overdosage: If you think you have taken too much of this medicine contact a poison control center or emergency room at once. NOTE: This medicine is only for you. Do not share this medicine with others. What if I miss a dose? If you miss a dose, take it as soon as you can. If it is almost time for your next dose, take only that dose. Do not take double or extra doses. What may interact with this medicine? -acetaminophen -cyclosporine -digoxin -medicines for blood pressure -medicines for  diabetes, including insulin -medicines for hay fever and other allergies -medicines for depression, especially an Monoamine Oxidase Inhibitor (MAOI) -medicines for Parkinson's disease, like levodopa -medicines for sleep or for pain -tetracycline This list may not describe all possible interactions. Give your health care provider a list of all the medicines, herbs, non-prescription drugs, or dietary supplements you use. Also tell them if you smoke, drink alcohol, or use illegal drugs. Some items  may interact with your medicine. What should I watch for while using this medicine? It may take a few weeks for your stomach condition to start to get better. However, do not take this medicine for longer than 12 weeks. The longer you take this medicine, and the more you take it, the greater your chances are of developing serious side effects. If you are an elderly patient, a female patient, or you have diabetes, you may be at an increased risk for side effects from this medicine. Contact your doctor immediately if you start having movements you cannot control such as lip smacking, rapid movements of the tongue, involuntary or uncontrollable movements of the eyes, head, arms and legs, or muscle twitches and spasms. Patients and their families should watch out for worsening depression or thoughts of suicide. Also watch out for any sudden or severe changes in feelings such as feeling anxious, agitated, panicky, irritable, hostile, aggressive, impulsive, severely restless, overly excited and hyperactive, or not being able to sleep. If this happens, especially at the beginning of treatment or after a change in dose, call your doctor. Do not treat yourself for high fever. Ask your doctor or health care professional for advice. You may get drowsy or dizzy. Do not drive, use machinery, or do anything that needs mental alertness until you know how this drug affects you. Do not stand or sit up quickly, especially if you are an older patient. This reduces the risk of dizzy or fainting spells. Alcohol can make you more drowsy and dizzy. Avoid alcoholic drinks. What side effects may I notice from receiving this medicine? Side effects that you should report to your doctor or health care professional as soon as possible: -allergic reactions like skin rash, itching or hives, swelling of the face, lips, or tongue -abnormal production of milk in females -breast enlargement in both males and females -change in the way you  walk -difficulty moving, speaking or swallowing -drooling, lip smacking, or rapid movements of the tongue -excessive sweating -fever -involuntary or uncontrollable movements of the eyes, head, arms and legs -irregular heartbeat or palpitations -muscle twitches and spasms -unusually weak or tired Side effects that usually do not require medical attention (report to your doctor or health care professional if they continue or are bothersome): -change in sex drive or performance -depressed mood -diarrhea -difficulty sleeping -headache -menstrual changes -restless or nervous This list may not describe all possible side effects. Call your doctor for medical advice about side effects. You may report side effects to FDA at 1-800-FDA-1088. Where should I keep my medicine? Keep out of the reach of children. Store at room temperature between 20 and 25 degrees C (68 and 77 degrees F). Protect from light. Keep container tightly closed. Throw away any unused medicine after the expiration date. NOTE: This sheet is a summary. It may not cover all possible information. If you have questions about this medicine, talk to your doctor, pharmacist, or health care provider.  2015, Elsevier/Gold Standard. (2011-08-09 13:04:38)

## 2013-10-18 NOTE — ED Provider Notes (Signed)
CSN: [id number]     Arrival date & time 10/17/13  2254 History   First MD Initiated Contact with Patient 10/18/13 0216     Chief Complaint  Patient presents with  . Abdominal Pain     (Consider location/radiation/quality/duration/timing/severity/associated sxs/prior Treatment) Patient is a 33 y.o. female presenting with abdominal pain. The history is provided by the patient.  Abdominal Pain She is having pain in the pubic area with radiation to the right side of her abdomen for about the last 2-3 days. Much worse today. There is associated nausea and dry heaves but no vomiting. She denies any urinary urgency, frequency, tenesmus, dysuria. There's been no constipation diarrhea. She denies fever, chills, sweats. Pain is severe and she rates at 10/10. She is unable to find a position that is comfortable. Nothing makes pain better, and nothing makes it worse.  Past Medical History  Diagnosis Date  . Asthma   . Seizures   . Complication of anesthesia     " I have a hard time waking & I cry "  . Depression     panic disorder  . MRSA (methicillin resistant staph aureus) culture positive    Past Surgical History  Procedure Laterality Date  . Tonsillectomy    . Tubal ligation    . Adenoidectomy     History reviewed. No pertinent family history. History  Substance Use Topics  . Smoking status: Former Smoker -- 0.25 packs/day    Quit date: 06/18/2013  . Smokeless tobacco: Not on file  . Alcohol Use: No   OB History   Grav Para Term Preterm Abortions TAB SAB Ect Mult Living                 Review of Systems  Gastrointestinal: Positive for abdominal pain.  All other systems reviewed and are negative.     Allergies  Vancomycin; Amoxicillin-pot clavulanate; Erythromycin; Sudafed; and Zithromax  Home Medications   Prior to Admission medications   Medication Sig Start Date End Date Taking? Authorizing Provider  clindamycin (CLEOCIN) 150 MG capsule Take 2 capsules (300 mg  total) by mouth 3 (three) times daily. May dispense as 150mg  capsules 07/14/13   Jillyn LedgerJessica K Palmer, PA-C  HYDROcodone-acetaminophen (NORCO/VICODIN) 5-325 MG per tablet Take 1 tablet by mouth every 6 (six) hours as needed. 08/17/13   Fayrene HelperBowie Tran, PA-C  levETIRAcetam (KEPPRA) 500 MG tablet Take 1 tablet (500 mg total) by mouth 2 (two) times daily. 09/19/12   Charles B. Bernette MayersSheldon, MD  penicillin v potassium (VEETID) 500 MG tablet Take 1 tablet (500 mg total) by mouth 3 (three) times daily. 08/17/13   Fayrene HelperBowie Tran, PA-C   BP 126/81  Pulse 90  Temp(Src) 98.2 F (36.8 C) (Oral)  Resp 20  Ht 5\' 7"  (1.702 m)  Wt 142 lb (64.411 kg)  BMI 22.24 kg/m2  SpO2 97%  LMP 09/16/2013 Physical Exam  Nursing note and vitals reviewed.  33 year old female, resting comfortably and in no acute distress. Vital signs are normal. Oxygen saturation is 97%, which is normal. Head is normocephalic and atraumatic. PERRLA, EOMI. Oropharynx is clear. Neck is nontender and supple without adenopathy or JVD. Back is nontender in the midline. There is moderate right CVA tenderness. Lungs are clear without rales, wheezes, or rhonchi. Chest is nontender. Heart has regular rate and rhythm without murmur. Abdomen is soft, flat, with mild to moderate tenderness in the right mid and lower abdomen and into the suprapubic area. There is no rebound or  guarding. There are no masses or hepatosplenomegaly and peristalsis is hypoactive. Extremities have no cyanosis or edema, full range of motion is present. Skin is warm and dry without rash. Neurologic: Mental status is normal, cranial nerves are intact, there are no motor or sensory deficits.  ED Course  Procedures (including critical care time) Labs Review Results for orders placed during the hospital encounter of 10/18/13  URINALYSIS, ROUTINE W REFLEX MICROSCOPIC      Result Value Ref Range   Color, Urine YELLOW  YELLOW   APPearance CLEAR  CLEAR   Specific Gravity, Urine 1.020  1.005 -  1.030   pH 6.5  5.0 - 8.0   Glucose, UA NEGATIVE  NEGATIVE mg/dL   Hgb urine dipstick NEGATIVE  NEGATIVE   Bilirubin Urine NEGATIVE  NEGATIVE   Ketones, ur NEGATIVE  NEGATIVE mg/dL   Protein, ur NEGATIVE  NEGATIVE mg/dL   Urobilinogen, UA 0.2  0.0 - 1.0 mg/dL   Nitrite NEGATIVE  NEGATIVE   Leukocytes, UA NEGATIVE  NEGATIVE  PREGNANCY, URINE      Result Value Ref Range   Preg Test, Ur NEGATIVE  NEGATIVE  CBC WITH DIFFERENTIAL      Result Value Ref Range   WBC 11.9 (*) 4.0 - 10.5 K/uL   RBC 4.55  3.87 - 5.11 MIL/uL   Hemoglobin 13.0  12.0 - 15.0 g/dL   HCT 16.139.4  09.636.0 - 04.546.0 %   MCV 86.6  78.0 - 100.0 fL   MCH 28.6  26.0 - 34.0 pg   MCHC 33.0  30.0 - 36.0 g/dL   RDW 40.914.6  81.111.5 - 91.415.5 %   Platelets 231  150 - 400 K/uL   Neutrophils Relative % 67  43 - 77 %   Neutro Abs 8.0 (*) 1.7 - 7.7 K/uL   Lymphocytes Relative 23  12 - 46 %   Lymphs Abs 2.7  0.7 - 4.0 K/uL   Monocytes Relative 6  3 - 12 %   Monocytes Absolute 0.7  0.1 - 1.0 K/uL   Eosinophils Relative 4  0 - 5 %   Eosinophils Absolute 0.5  0.0 - 0.7 K/uL   Basophils Relative 0  0 - 1 %   Basophils Absolute 0.0  0.0 - 0.1 K/uL  COMPREHENSIVE METABOLIC PANEL      Result Value Ref Range   Sodium 138  137 - 147 mEq/L   Potassium 4.1  3.7 - 5.3 mEq/L   Chloride 98  96 - 112 mEq/L   CO2 28  19 - 32 mEq/L   Glucose, Bld 102 (*) 70 - 99 mg/dL   BUN 23  6 - 23 mg/dL   Creatinine, Ser 7.820.67  0.50 - 1.10 mg/dL   Calcium 9.3  8.4 - 95.610.5 mg/dL   Total Protein 7.7  6.0 - 8.3 g/dL   Albumin 4.0  3.5 - 5.2 g/dL   AST 18  0 - 37 U/L   ALT 9  0 - 35 U/L   Alkaline Phosphatase 42  39 - 117 U/L   Total Bilirubin 0.2 (*) 0.3 - 1.2 mg/dL   GFR calc non Af Amer >90  >90 mL/min   GFR calc Af Amer >90  >90 mL/min  LIPASE, BLOOD      Result Value Ref Range   Lipase 19  11 - 59 U/L   Imaging Review Ct Abdomen Pelvis Wo Contrast  10/18/2013   CLINICAL DATA:  Right flank pain  EXAM: CT ABDOMEN  AND PELVIS WITHOUT CONTRAST  TECHNIQUE:  Multidetector CT imaging of the abdomen and pelvis was performed following the standard protocol without IV contrast.  COMPARISON:  Prior CT from 01/17/2010.  FINDINGS: The visualized lung bases are clear. Small fat containing Bochdalek's hernia present at the left lung base.  Limited noncontrast evaluation of the liver is unremarkable. Subtle hyperdensity within the gallbladder lumen likely represents a small gallstone. No biliary ductal dilatation. The spleen, left adrenal glands, and pancreas demonstrate a normal unenhanced appearance. Dystrophic calcifications within the right adrenal gland are unchanged.  Kidneys are equal in size without evidence of nephrolithiasis or hydronephrosis. No stones seen along the course of either renal collecting system. There is no hydroureter.  Stomach within normal limits. No evidence of bowel obstruction. No acute inflammatory changes seen about the bowels. No evidence for acute appendicitis.  Bladder is unremarkable.  Uterus and ovaries within normal limits.  No free air or fluid. No enlarged intra-abdominal pelvic lymph nodes.  No acute osseus abnormality. No worrisome lytic or blastic osseous lesions.  IMPRESSION: 1. No CT evidence of nephrolithiasis or obstructive uropathy. 2. No other acute intra-abdominal pelvic process. 3. Cholelithiasis.   Electronically Signed   By: Rise Mu M.D.   On: 10/18/2013 03:55   MDM   Final diagnoses:  Left lower quadrant pain    Right-sided abdominal and flank pain in a pattern most consistent with biliary colic. She will be sent for CT scan. She is given hydromorphone for pain and ondansetron for nausea. Old records are reviewed and she has no relevant past visits.  CT has come back with out any evidence of urolithiasis or nephrolithiasis. Incidental finding of cholelithiasis it is unrelated to her current symptoms. She got good relief of pain with above noted treatment. Urinalysis shows no evidence of urinary tract  infection. Causes of her pain is unclear at there is no indication for hospitalization at this time. She is discharged with prescriptions for oxycodone-acetaminophen and metoclopramide. She is to return to the ED if symptoms appear to be worsening.  Dione Booze, MD 10/18/13 [phone]

## 2014-11-28 ENCOUNTER — Encounter (HOSPITAL_COMMUNITY): Payer: Self-pay | Admitting: Emergency Medicine

## 2014-11-28 ENCOUNTER — Emergency Department (HOSPITAL_COMMUNITY)
Admission: EM | Admit: 2014-11-28 | Discharge: 2014-11-28 | Disposition: A | Discharge status: Home / Self Care | Payer: Self-pay | Attending: Emergency Medicine | Admitting: Emergency Medicine

## 2014-11-28 ENCOUNTER — Emergency Department (HOSPITAL_COMMUNITY): Payer: Self-pay

## 2014-11-28 DIAGNOSIS — Z8614 Personal history of Methicillin resistant Staphylococcus aureus infection: Secondary | ICD-10-CM | POA: Insufficient documentation

## 2014-11-28 DIAGNOSIS — G40909 Epilepsy, unspecified, not intractable, without status epilepticus: Secondary | ICD-10-CM | POA: Insufficient documentation

## 2014-11-28 DIAGNOSIS — Z79899 Other long term (current) drug therapy: Secondary | ICD-10-CM | POA: Insufficient documentation

## 2014-11-28 DIAGNOSIS — Z3202 Encounter for pregnancy test, result negative: Secondary | ICD-10-CM | POA: Insufficient documentation

## 2014-11-28 DIAGNOSIS — Y9289 Other specified places as the place of occurrence of the external cause: Secondary | ICD-10-CM | POA: Insufficient documentation

## 2014-11-28 DIAGNOSIS — W109XXA Fall (on) (from) unspecified stairs and steps, initial encounter: Secondary | ICD-10-CM | POA: Insufficient documentation

## 2014-11-28 DIAGNOSIS — S300XXA Contusion of lower back and pelvis, initial encounter: Secondary | ICD-10-CM | POA: Insufficient documentation

## 2014-11-28 DIAGNOSIS — Y9389 Activity, other specified: Secondary | ICD-10-CM | POA: Insufficient documentation

## 2014-11-28 DIAGNOSIS — F410 Panic disorder [episodic paroxysmal anxiety] without agoraphobia: Secondary | ICD-10-CM | POA: Insufficient documentation

## 2014-11-28 DIAGNOSIS — Y998 Other external cause status: Secondary | ICD-10-CM | POA: Insufficient documentation

## 2014-11-28 DIAGNOSIS — S3992XA Unspecified injury of lower back, initial encounter: Secondary | ICD-10-CM | POA: Insufficient documentation

## 2014-11-28 DIAGNOSIS — Z87891 Personal history of nicotine dependence: Secondary | ICD-10-CM | POA: Insufficient documentation

## 2014-11-28 DIAGNOSIS — J45909 Unspecified asthma, uncomplicated: Secondary | ICD-10-CM | POA: Insufficient documentation

## 2014-11-28 DIAGNOSIS — S199XXA Unspecified injury of neck, initial encounter: Secondary | ICD-10-CM | POA: Insufficient documentation

## 2014-11-28 DIAGNOSIS — Z880 Allergy status to penicillin: Secondary | ICD-10-CM | POA: Insufficient documentation

## 2014-11-28 DIAGNOSIS — F329 Major depressive disorder, single episode, unspecified: Secondary | ICD-10-CM | POA: Insufficient documentation

## 2014-11-28 LAB — POC URINE PREG, ED: PREG TEST UR: NEGATIVE

## 2014-11-28 MED ORDER — CYCLOBENZAPRINE HCL 10 MG PO TABS
10.0000 mg | ORAL_TABLET | Freq: Once | ORAL | Status: AC
  Administered 2014-11-28: 10 mg via ORAL
  Filled 2014-11-28: qty 1

## 2014-11-28 MED ORDER — MELOXICAM 15 MG PO TABS: 15.0000 mg | ORAL_TABLET | Freq: Every day | ORAL | Status: DC

## 2014-11-28 MED ORDER — CYCLOBENZAPRINE HCL 10 MG PO TABS: 10.0000 mg | ORAL_TABLET | Freq: Two times a day (BID) | ORAL | Status: DC | PRN

## 2014-11-28 MED ORDER — HYDROCODONE-ACETAMINOPHEN 5-325 MG PO TABS
2.0000 | ORAL_TABLET | Freq: Once | ORAL | Status: AC
  Administered 2014-11-28: 2 via ORAL
  Filled 2014-11-28: qty 2

## 2014-11-28 NOTE — Discharge Instructions (Signed)
Take mobic as needed for pain. Take flexeril as needed for muscle spasm. Refer to attached documents for more information.  °

## 2014-11-28 NOTE — ED Notes (Signed)
Pt from home c/o low back pain, neck pain, and tail bone pain after a fall at 1030 this morning. She reports a bruise to the sacrum. Denies hitting head or LOC. Pt does report she goes to the methadone clinic.

## 2014-11-28 NOTE — ED Provider Notes (Signed)
CSN: [id number]     Arrival date & time 11/28/14  2058 History  This chart was scribed for non-physician practitioner, Emilia Beck, PA-C working with Mancel Bale, MD by Doreatha Martin, ED scribe. This patient was seen in room WTR5/WTR5 and the patient's care was started at 10:33 PM    Chief Complaint  Patient presents with  . Tailbone Pain  . Back Pain   The history is provided by the patient. No language interpreter was used.    HPI Comments: [name] is a 34 y.o. female who presents to the Emergency Department complaining of a mechanical fall that occurred earlier this evening. She states that she was walking with booties on a newly polyurethaned floor when she slipped and landed on her tailbone. She states no LOC or head injury. Pt reports associated tailbone pain, lower back pain, neck pain. Pt states that pain is worsened with movement. She denies abdominal pain.    Past Medical History  Diagnosis Date  . Asthma   . Seizures   . Complication of anesthesia     " I have a hard time waking & I cry "  . Depression     panic disorder  . MRSA (methicillin resistant staph aureus) culture positive    Past Surgical History  Procedure Laterality Date  . Tonsillectomy    . Tubal ligation    . Adenoidectomy     No family history on file. History  Substance Use Topics  . Smoking status: Former Smoker -- 0.25 packs/day    Quit date: 06/18/2013  . Smokeless tobacco: Not on file  . Alcohol Use: No   OB History    No data available     Review of Systems  Gastrointestinal: Negative for abdominal pain.  Musculoskeletal: Positive for back pain, arthralgias and neck pain.  All other systems reviewed and are negative.  Allergies  Vancomycin; Amoxicillin-pot clavulanate; Erythromycin; Sudafed; and Zithromax  Home Medications   Prior to Admission medications   Medication Sig Start Date End Date Taking? Authorizing Provider  acetaminophen (TYLENOL) 500 MG tablet Take 1,000 mg  by mouth every 6 (six) hours as needed for mild pain, moderate pain or headache.   Yes Historical Provider, MD  methadone (DOLOPHINE) 10 MG/5ML solution Take 40 mg by mouth daily.   Yes Historical Provider, MD  naproxen sodium (ANAPROX) 220 MG tablet Take 880 mg by mouth daily as needed (pain).   Yes Historical Provider, MD  traMADol (ULTRAM) 50 MG tablet Take 100 mg by mouth once.   Yes Historical Provider, MD  levETIRAcetam (KEPPRA) 500 MG tablet Take 1 tablet (500 mg total) by mouth 2 (two) times daily. Patient not taking: Reported on 11/28/2014 09/19/12   Susy Frizzle, MD  metoCLOPramide (REGLAN) 10 MG tablet Take 1 tablet (10 mg total) by mouth every 6 (six) hours as needed. Patient not taking: Reported on 11/28/2014 10/18/13   Dione Booze, MD   BP 148/80 mmHg  Pulse 78  Temp(Src) 98 F (36.7 C) (Oral)  Resp 16  SpO2 100%  LMP 11/10/2014 Physical Exam  Constitutional: She is oriented to person, place, and time. She appears well-developed and well-nourished. No distress.  HENT:  Head: Normocephalic and atraumatic.  Eyes: Conjunctivae and EOM are normal. Pupils are equal, round, and reactive to light.  Neck: Normal range of motion. Neck supple.  Cardiovascular: Normal rate and regular rhythm.  Exam reveals no gallop and no friction rub.   No murmur heard. Pulmonary/Chest: Effort normal and  breath sounds normal. No respiratory distress. She has no wheezes. She has no rales. She exhibits no tenderness.  Abdominal: Soft. She exhibits no distension. There is no tenderness.  Musculoskeletal: Normal range of motion.  Sacral tenderness to palpation. No other midline spine tenderness to palpation.   Neurological: She is alert and oriented to person, place, and time.  Speech is goal-oriented. Moves limbs without ataxia.   Skin: Skin is warm and dry.  Bruise noted at the gluteal cleft.   Psychiatric: She has a normal mood and affect. Her behavior is normal.  Nursing note and vitals  reviewed.  ED Course  Procedures (including critical care time) DIAGNOSTIC STUDIES: Oxygen Saturation is 100% on RA, normal by my interpretation.    COORDINATION OF CARE: 10:35 PM Discussed treatment plan with pt at bedside and pt agreed to plan.   Labs Review Labs Reviewed  POC URINE PREG, ED    Imaging Review Dg Lumbar Spine Complete  11/28/2014   CLINICAL DATA:  Larey Seat down 13 steps this morning. Low back pain. Remote history of tail bone fracture.  EXAM: LUMBAR SPINE - COMPLETE 4+ VIEW; SACRUM AND COCCYX - 2+ VIEW  COMPARISON:  CT abdomen and pelvis October 18, 2013  FINDINGS: Five non rib-bearing lumbar-type vertebral bodies are intact and aligned with maintenance of the lumbar lordosis. Intervertebral disc heights are normal. No destructive bony lesions.  New small bony fragments at the upper coccyx, however these appear corticated. Sacroiliac joints are symmetric. Included prevertebral and paraspinal soft tissue planes are non-suspicious.  IMPRESSION: Interval nondisplaced coccygeal fracture, age indeterminate.  No acute lumbar spine fracture deformity or malalignment.   Electronically Signed   By: Awilda Metro M.D.   On: 11/28/2014 22:33   Dg Sacrum/coccyx  11/28/2014   CLINICAL DATA:  Larey Seat down 13 steps this morning. Low back pain. Remote history of tail bone fracture.  EXAM: LUMBAR SPINE - COMPLETE 4+ VIEW; SACRUM AND COCCYX - 2+ VIEW  COMPARISON:  CT abdomen and pelvis October 18, 2013  FINDINGS: Five non rib-bearing lumbar-type vertebral bodies are intact and aligned with maintenance of the lumbar lordosis. Intervertebral disc heights are normal. No destructive bony lesions.  New small bony fragments at the upper coccyx, however these appear corticated. Sacroiliac joints are symmetric. Included prevertebral and paraspinal soft tissue planes are non-suspicious.  IMPRESSION: Interval nondisplaced coccygeal fracture, age indeterminate.  No acute lumbar spine fracture deformity or  malalignment.   Electronically Signed   By: Awilda Metro M.D.   On: 11/28/2014 22:33     EKG Interpretation None      MDM   Final diagnoses:  Tailbone injury, initial encounter    10:47 PM No new fracture. Patient reports history of sacral fracture which is likely what is visualized on the current xray. No other injury. Patient will have mobic and flexeril for pain.   I personally performed the services described in this documentation, which was scribed in my presence. The recorded information has been reviewed and is accurate.   Emilia Beck, PA-C 11/28/14 2249  Mancel Bale, MD 11/28/14 [phone]

## 2015-03-26 ENCOUNTER — Encounter (HOSPITAL_COMMUNITY): Payer: Self-pay | Admitting: Emergency Medicine

## 2015-03-26 ENCOUNTER — Emergency Department (HOSPITAL_COMMUNITY)
Admission: EM | Admit: 2015-03-26 | Discharge: 2015-03-26 | Disposition: A | Discharge status: Home / Self Care | Payer: Medicaid Other | Attending: Physician Assistant | Admitting: Physician Assistant

## 2015-03-26 DIAGNOSIS — Z87891 Personal history of nicotine dependence: Secondary | ICD-10-CM | POA: Diagnosis not present

## 2015-03-26 DIAGNOSIS — Z880 Allergy status to penicillin: Secondary | ICD-10-CM | POA: Insufficient documentation

## 2015-03-26 DIAGNOSIS — L0201 Cutaneous abscess of face: Secondary | ICD-10-CM | POA: Insufficient documentation

## 2015-03-26 DIAGNOSIS — J45909 Unspecified asthma, uncomplicated: Secondary | ICD-10-CM | POA: Insufficient documentation

## 2015-03-26 DIAGNOSIS — F329 Major depressive disorder, single episode, unspecified: Secondary | ICD-10-CM | POA: Insufficient documentation

## 2015-03-26 DIAGNOSIS — Z8614 Personal history of Methicillin resistant Staphylococcus aureus infection: Secondary | ICD-10-CM | POA: Insufficient documentation

## 2015-03-26 DIAGNOSIS — L0291 Cutaneous abscess, unspecified: Secondary | ICD-10-CM

## 2015-03-26 MED ORDER — CLINDAMYCIN HCL 150 MG PO CAPS: 300.0000 mg | ORAL_CAPSULE | Freq: Three times a day (TID) | ORAL | Status: DC

## 2015-03-26 NOTE — Discharge Instructions (Signed)
Incision and Drainage °Incision and drainage is a procedure in which a sac-like structure (cystic structure) is opened and drained. The area to be drained usually contains material such as pus, fluid, or blood.  °LET YOUR CAREGIVER KNOW ABOUT:  °· Allergies to medicine. °· Medicines taken, including vitamins, herbs, eyedrops, over-the-counter medicines, and creams. °· Use of steroids (by mouth or creams). °· Previous problems with anesthetics or numbing medicines. °· History of bleeding problems or blood clots. °· Previous surgery. °· Other health problems, including diabetes and kidney problems. °· Possibility of pregnancy, if this applies. °RISKS AND COMPLICATIONS °· Pain. °· Bleeding. °· Scarring. °· Infection. °BEFORE THE PROCEDURE  °You may need to have an ultrasound or other imaging tests to see how large or deep your cystic structure is. Blood tests may also be used to determine if you have an infection or how severe the infection is. You may need to have a tetanus shot. °PROCEDURE  °The affected area is cleaned with a cleaning fluid. The cyst area will then be numbed with a medicine (local anesthetic). A small incision will be made in the cystic structure. A syringe or catheter may be used to drain the contents of the cystic structure, or the contents may be squeezed out. The area will then be flushed with a cleansing solution. After cleansing the area, it is often gently packed with a gauze or another wound dressing. Once it is packed, it will be covered with gauze and tape or some other type of wound dressing.  °AFTER THE PROCEDURE  °· Often, you will be allowed to go home right after the procedure. °· You may be given antibiotic medicine to prevent or heal an infection. °· If the area was packed with gauze or some other wound dressing, you will likely need to come back in 1 to 2 days to get it removed. °· The area should heal in about 14 days. °  °This information is not intended to replace advice given  to you by your health care provider. Make sure you discuss any questions you have with your health care provider. °  °Document Released: 10/05/2000 Document Revised: 10/11/2011 Document Reviewed: 06/06/2011 °Elsevier Interactive Patient Education ©2016 [employer] ° °Abscess °An abscess is an infected area that contains a collection of pus and debris. It can occur in almost any part of the body. An abscess is also known as a furuncle or boil. °CAUSES  °An abscess occurs when tissue gets infected. This can occur from blockage of oil or sweat glands, infection of hair follicles, or a minor injury to the skin. As the body tries to fight the infection, pus collects in the area and creates pressure under the skin. This pressure causes pain. People with weakened immune systems have difficulty fighting infections and get certain abscesses more often.  °SYMPTOMS °Usually an abscess develops on the skin and becomes a painful mass that is red, warm, and tender. If the abscess forms under the skin, you may feel a moveable soft area under the skin. Some abscesses break open (rupture) on their own, but most will continue to get worse without care. The infection can spread deeper into the body and eventually into the bloodstream, causing you to feel ill.  °DIAGNOSIS  °Your caregiver will take your medical history and perform a physical exam. A sample of fluid may also be taken from the abscess to determine what is causing your infection. °TREATMENT  °Your caregiver may prescribe antibiotic medicines to fight the infection.   However, taking antibiotics alone usually does not cure an abscess. Your caregiver may need to make a small cut (incision) in the abscess to drain the pus. In some cases, gauze is packed into the abscess to reduce pain and to continue draining the area. °HOME CARE INSTRUCTIONS  °· Only take over-the-counter or prescription medicines for pain, discomfort, or fever as directed by your caregiver. °· If you were  prescribed antibiotics, take them as directed. Finish them even if you start to feel better. °· If gauze is used, follow your caregiver's directions for changing the gauze. °· To avoid spreading the infection: °¨ Keep your draining abscess covered with a bandage. °¨ Wash your hands well. °¨ Do not share personal care items, towels, or whirlpools with others. °¨ Avoid skin contact with others. °· Keep your skin and clothes clean around the abscess. °· Keep all follow-up appointments as directed by your caregiver. °SEEK MEDICAL CARE IF:  °· You have increased pain, swelling, redness, fluid drainage, or bleeding. °· You have muscle aches, chills, or a general ill feeling. °· You have a fever. °MAKE SURE YOU:  °· Understand these instructions. °· Will watch your condition. °· Will get help right away if you are not doing well or get worse. °  °This information is not intended to replace advice given to you by your health care provider. Make sure you discuss any questions you have with your health care provider. °  °Document Released: 01/19/2005 Document Revised: 10/11/2011 Document Reviewed: 06/24/2011 °Elsevier Interactive Patient Education ©2016 [employer] ° °

## 2015-03-26 NOTE — ED Notes (Signed)
Pt states that she has hx of mrsa.  Pt states that she began having pain at the end of her nose about 5 days ago.  States that she began having a bump yesterday.  Small bump noted to end of nose.  States that she tried to pop it with her mother's diabetic lancet.  Pt would like it noted in her chart that she is no longer a patient of the methadone clinic.

## 2015-03-26 NOTE — ED Provider Notes (Signed)
CSN: [id number]     Arrival date & time 03/26/15  0905 History   First MD Initiated Contact with Patient 03/26/15 0935     Chief Complaint  Patient presents with  . Abscess     (Consider location/radiation/quality/duration/timing/severity/associated sxs/prior Treatment) HPI   Patient is a 34 year old female presenting with abscess to the tip of her nose. Patient reports she's had multiple abscess in the past. She persists on started 3 days ago. She reports trying to do warm compresses home. She tried to lift herself with a diabetic needle. Patient reports the pain has gotten so badd that she would like to have it taken care of.  Past Medical History  Diagnosis Date  . Asthma   . Seizures ([facility])   . Complication of anesthesia     " I have a hard time waking & I cry "  . Depression     panic disorder  . MRSA (methicillin resistant staph aureus) culture positive    Past Surgical History  Procedure Laterality Date  . Tonsillectomy    . Tubal ligation    . Adenoidectomy     No family history on file. Social History  Substance Use Topics  . Smoking status: Former Smoker -- 0.25 packs/day    Quit date: 06/18/2013  . Smokeless tobacco: None  . Alcohol Use: No   OB History    No data available     Review of Systems  Constitutional: Negative for activity change.  Respiratory: Negative for shortness of breath.   Cardiovascular: Negative for chest pain.  Gastrointestinal: Negative for abdominal pain.      Allergies  Vancomycin; Amoxicillin-pot clavulanate; Erythromycin; Sudafed; and Zithromax  Home Medications   Prior to Admission medications   Medication Sig Start Date End Date Taking? Authorizing Provider  cyclobenzaprine (FLEXERIL) 10 MG tablet Take 1 tablet (10 mg total) by mouth 2 (two) times daily as needed for muscle spasms. Patient not taking: Reported on 03/26/2015 11/28/14   Emilia Beck, PA-C  levETIRAcetam (KEPPRA) 500 MG tablet Take 1 tablet (500 mg  total) by mouth 2 (two) times daily. Patient not taking: Reported on 11/28/2014 09/19/12   Susy Frizzle, MD  meloxicam (MOBIC) 15 MG tablet Take 1 tablet (15 mg total) by mouth daily. Patient not taking: Reported on 03/26/2015 11/28/14   Kaitlyn Szekalski, PA-C   BP 130/90 mmHg  Pulse 99  Temp(Src) 97.8 F (36.6 C) (Oral)  Resp 18  SpO2 100% Physical Exam  Constitutional: She is oriented to person, place, and time. She appears well-developed and well-nourished.  HENT:  Head: Normocephalic and atraumatic.  1 cm round erythematous area with crusted area at the tip of the nose. No active draining. Induration. Erythema. Consistent with abscess.  Eyes: Right eye exhibits no discharge.  Cardiovascular: Normal rate, regular rhythm and normal heart sounds.   No murmur heard. Pulmonary/Chest: Effort normal and breath sounds normal. She has no wheezes. She has no rales.  Abdominal: Soft. She exhibits no distension. There is no tenderness.  Neurological: She is oriented to person, place, and time.  Skin: Skin is warm and dry. She is not diaphoretic.  Psychiatric: She has a normal mood and affect.  Nursing note and vitals reviewed.   ED Course  Procedures (including critical care time) Labs Review Labs Reviewed - No data to display  Imaging Review No results found. I have personally reviewed and evaluated these images and lab results as part of my medical decision-making.   EKG Interpretation None  MDM   Final diagnoses:  None   patient is 34 year old female presenting with abscess to the tip of nose. I  expressed d my concern in [place] this abscess given that was on her face and is worried about scarring. However patient states that she has "scars all over" and would like us to open up this up because causing her much pain. We will use painease and then make a small incision possible to allow for drainage. I will give her 3 days of antibiotics as a watch and wait prescription in  case this draining is not sufficient.  INCISION AND DRAINAGE Performed by: Arlana Hoveourteney L Yanira Tolsma Consent: Verbal consent obtained. Risks and benefits: risks, benefits and alternatives were discussed Type: abscess  Body area: nose  Anesthesia: local infiltration  Incision was made with a scalpel.- < .5 cm incision made  Local anesthetic: Painease  Complexity: simple  Drainage: purulent  Drainage amount: purulent discharge squeezed out.   Patient tolerance: Patient tolerated the procedure well with no immediate complications.        Laycie Schriner Randall AnLyn Latrell Reitan, MD 03/26/15 1003

## 2015-05-12 MED FILL — SUBOXONE 4 MG-1 MG SL FILM: 4-1 | 7 days supply | Qty: 14 | Fill #0

## 2015-05-19 MED FILL — SUBOXONE 8 MG-2 MG SL FILM: 8-2 | 7 days supply | Qty: 14 | Fill #0

## 2015-05-26 MED FILL — SUBOXONE 8 MG-2 MG SL FILM: 8-2 | 7 days supply | Qty: 14 | Fill #0

## 2015-06-02 MED FILL — SUBOXONE 8 MG-2 MG SL FILM: 8-2 | 14 days supply | Qty: 18 | Fill #0

## 2015-06-09 MED FILL — SUBOXONE 8 MG-2 MG SL FILM: 8-2 | 7 days supply | Qty: 18 | Fill #0

## 2015-06-16 MED FILL — SUBOXONE 8 MG-2 MG SL FILM: 8-2 | 7 days supply | Qty: 18 | Fill #0

## 2015-06-23 MED FILL — SUBOXONE 8 MG-2 MG SL FILM: 8-2 | 7 days supply | Qty: 21 | Fill #0

## 2015-06-30 MED FILL — SUBOXONE 8 MG-2 MG SL FILM: 8-2 | 7 days supply | Qty: 21 | Fill #0

## 2015-07-07 MED FILL — SUBOXONE 8 MG-2 MG SL FILM: 8-2 | 7 days supply | Qty: 21 | Fill #0

## 2015-07-14 MED FILL — SUBOXONE 8 MG-2 MG SL FILM: 8-2 | 7 days supply | Qty: 21 | Fill #0

## 2015-07-21 MED FILL — SUBOXONE 8 MG-2 MG SL FILM: 8-2 | 7 days supply | Qty: 21 | Fill #0

## 2015-07-28 MED FILL — SUBOXONE 8 MG-2 MG SL FILM: 8-2 | 7 days supply | Qty: 21 | Fill #0

## 2015-08-06 MED FILL — SUBOXONE 8 MG-2 MG SL FILM: 8-2 | 7 days supply | Qty: 21 | Fill #0

## 2015-08-07 ENCOUNTER — Emergency Department (HOSPITAL_COMMUNITY)
Admission: EM | Admit: 2015-08-07 | Discharge: 2015-08-07 | Disposition: A | Discharge status: Home / Self Care | Payer: Medicaid Other | Attending: Emergency Medicine | Admitting: Emergency Medicine

## 2015-08-07 ENCOUNTER — Encounter (HOSPITAL_COMMUNITY): Payer: Self-pay | Admitting: Emergency Medicine

## 2015-08-07 DIAGNOSIS — J45909 Unspecified asthma, uncomplicated: Secondary | ICD-10-CM | POA: Diagnosis not present

## 2015-08-07 DIAGNOSIS — Z8659 Personal history of other mental and behavioral disorders: Secondary | ICD-10-CM | POA: Insufficient documentation

## 2015-08-07 DIAGNOSIS — M79651 Pain in right thigh: Secondary | ICD-10-CM | POA: Insufficient documentation

## 2015-08-07 DIAGNOSIS — Z79899 Other long term (current) drug therapy: Secondary | ICD-10-CM | POA: Diagnosis not present

## 2015-08-07 DIAGNOSIS — L02414 Cutaneous abscess of left upper limb: Secondary | ICD-10-CM | POA: Insufficient documentation

## 2015-08-07 DIAGNOSIS — M79652 Pain in left thigh: Secondary | ICD-10-CM | POA: Diagnosis not present

## 2015-08-07 DIAGNOSIS — L0291 Cutaneous abscess, unspecified: Secondary | ICD-10-CM

## 2015-08-07 DIAGNOSIS — Z880 Allergy status to penicillin: Secondary | ICD-10-CM | POA: Diagnosis not present

## 2015-08-07 DIAGNOSIS — L539 Erythematous condition, unspecified: Secondary | ICD-10-CM | POA: Insufficient documentation

## 2015-08-07 DIAGNOSIS — Z87891 Personal history of nicotine dependence: Secondary | ICD-10-CM | POA: Diagnosis not present

## 2015-08-07 DIAGNOSIS — M79632 Pain in left forearm: Secondary | ICD-10-CM | POA: Diagnosis present

## 2015-08-07 DIAGNOSIS — Z8614 Personal history of Methicillin resistant Staphylococcus aureus infection: Secondary | ICD-10-CM | POA: Insufficient documentation

## 2015-08-07 MED ORDER — CLINDAMYCIN HCL 150 MG PO CAPS: 300.0000 mg | ORAL_CAPSULE | Freq: Four times a day (QID) | ORAL | Status: DC

## 2015-08-07 MED ORDER — LIDOCAINE-EPINEPHRINE (PF) 2 %-1:200000 IJ SOLN: 10.0000 mL | Freq: Once | INTRAMUSCULAR | Status: DC

## 2015-08-07 MED ORDER — LIDOCAINE-EPINEPHRINE 2 %-1:100000 IJ SOLN
INTRAMUSCULAR | Status: AC
  Administered 2015-08-07: 2 mL
  Filled 2015-08-07: qty 1

## 2015-08-07 MED ORDER — NAPROXEN 500 MG PO TABS
500.0000 mg | ORAL_TABLET | Freq: Two times a day (BID) | ORAL | Status: DC
Start: 2015-08-07 — End: 2017-09-16

## 2015-08-07 NOTE — ED Provider Notes (Signed)
CSN: [id number]     Arrival date & time 08/07/15  1225 History  By signing my name below, I, Tanda Rockers, attest that this documentation has been prepared under the direction and in the presence of Melburn Hake, PA-C. Electronically Signed: Tanda Rockers, ED Scribe. 08/07/2015. 2:29 PM.  Chief Complaint  Patient presents with  . Abscess   The history is provided by the patient. No language interpreter was used.     HPI Comments: [name] is a 35 y.o. female with PMHx MRSA who presents to the Emergency Department complaining of 2 areas of swelling, redness, and throbbing pain to left forearm x 4 days ago. Pt admits to attempting to pop the areas yesterday. She was able to drain one of the abscess with mild amount of white purulent drainage. Pt states she then applied a bleach soaked rag to the area. She woke up this morning with spreading similar areas of left forearm, left thigh, and right thigh. Pt has hx of MRSA and is concerned she could have it again. Pt also complains of pain to left upper arm radiating to axilla that began yesterday. Pt has never had similar pain in the past. No known injury to the area. No recent change in lotions, detergents, or soaps. Pt denies any family members that she lives with having similar symptoms. No hx DM or HIV. No concern for HIV. Pt has been tested for HIV in the past. Tetanus is up to date. Denies IVDA. Denies fever, chills, abdominal pain, chest pain, shortness of breath, body aches, weakness, numbness, tingling, or any other associated symptoms.   Past Medical History  Diagnosis Date  . Asthma   . Seizures (HCC)   . Complication of anesthesia     " I have a hard time waking & I cry "  . Depression     panic disorder  . MRSA (methicillin resistant staph aureus) culture positive    Past Surgical History  Procedure Laterality Date  . Tonsillectomy    . Tubal ligation    . Adenoidectomy     No family history on file. Social History   Substance Use Topics  . Smoking status: Former Smoker -- 0.25 packs/day    Quit date: 06/18/2013  . Smokeless tobacco: None  . Alcohol Use: No   OB History    No data available     Review of Systems  Constitutional: Negative for fever and chills.  Respiratory: Negative for shortness of breath.   Cardiovascular: Negative for chest pain.  Gastrointestinal: Negative for abdominal pain.  Musculoskeletal: Positive for arthralgias (Left forearm pain). Negative for myalgias.  Skin:       + Multiple areas of swelling, redness, and pain to left forearm, left thigh, and right thigh  Neurological: Negative for weakness and numbness.   Allergies  Vancomycin; Amoxicillin-pot clavulanate; Erythromycin; Sudafed; and Zithromax  Home Medications   Prior to Admission medications   Medication Sig Start Date End Date Taking? Authorizing Provider  SUBOXONE 8-2 MG FILM Place 1 each under the tongue 3 (three) times daily. 07/28/15  Yes Historical Provider, MD  clindamycin (CLEOCIN) 150 MG capsule Take 2 capsules (300 mg total) by mouth 4 (four) times daily. May dispense as  capsules 08/07/15   Barrett Henle, PA-C  cyclobenzaprine (FLEXERIL) 10 MG tablet Take 1 tablet (10 mg total) by mouth 2 (two) times daily as needed for muscle spasms. Patient not taking: Reported on 03/26/2015 11/28/14   Emilia Beck, PA-C  levETIRAcetam (  KEPPRA) 500 MG tablet Take 1 tablet (500 mg total) by mouth 2 (two) times daily. Patient not taking: Reported on 11/28/2014 09/19/12   Susy Frizzleharles Sheldon, MD  meloxicam (MOBIC) 15 MG tablet Take 1 tablet (15 mg total) by mouth daily. Patient not taking: Reported on 03/26/2015 11/28/14   Emilia BeckKaitlyn Szekalski, PA-C  naproxen (NAPROSYN) 500 MG tablet Take 1 tablet (500 mg total) by mouth 2 (two) times daily. 08/07/15   Satira SarkNicole Elizabeth Garnett Rekowski, PA-C   BP 114/68 mmHg  Pulse 83  Temp(Src) 97.6 F (36.4 C) (Oral)  Resp 18  SpO2 99%   Physical Exam  Constitutional: She is  oriented to person, place, and time. She appears well-developed and well-nourished.  HENT:  Head: Normocephalic and atraumatic.  Mouth/Throat: Uvula is midline, oropharynx is clear and moist and mucous membranes are normal. No oral lesions. No oropharyngeal exudate, posterior oropharyngeal edema, posterior oropharyngeal erythema or tonsillar abscesses.  Eyes: Conjunctivae and EOM are normal. Right eye exhibits no discharge. Left eye exhibits no discharge. No scleral icterus.  Neck: Normal range of motion. Neck supple.  Cardiovascular: Normal rate, regular rhythm, normal heart sounds and intact distal pulses.   Pulmonary/Chest: Effort normal and breath sounds normal. No respiratory distress. She has no wheezes. She has no rales. She exhibits no tenderness.  Abdominal: Soft. She exhibits no distension.  Lymphadenopathy:    She has no cervical adenopathy.  Neurological: She is alert and oriented to person, place, and time.  Skin: Skin is warm and dry.  2 1x1 cm areas of erythema, swelling, and induration noted to dorsal aspect of left mid forearm. No drainage.  Multiple small non raised erythematous lesions noted to bilateral upper and lower extremities.  No lesions noted to palms, soles, or inter digits.  No vesicles, excoriations, or burrows present.  2 cm non raised area of erythema present to volar aspect of left bicep, mildly TTP, no indurations or flucutance. No streaking or other evidence of vasculitis or phlebitis.  Multiple heals scars noted throughout skin exam where pt reports pt has had lesions and MRSA infections in the past.   Nursing note and vitals reviewed.   ED Course  Procedures (including critical care time)  INCISION AND DRAINAGE PROCEDURE NOTE: Patient identification was confirmed and verbal consent was obtained. This procedure was performed by Melburn HakeNicole Lassie Demorest, PA-C at 2:45 PM. Site: 2 abscesses located at dorsal aspect of left mid forearm Sterile procedures  observed Needle size: 25 Anesthetic used (type and amt): 2 mLs 2% Lidocaine with Epi Blade size: 11 Drainage: Small purulent drainage  Complexity: Complex Site anesthetized, incision made over site, wound drained and explored loculations, rinsed with copious amounts of normal saline, Pt tolerated procedure well without complications.  Instructions for care discussed verbally and pt provided with additional written instructions for homecare and f/u.   DIAGNOSTIC STUDIES: Oxygen Saturation is 95% on RA, adequate by my interpretation.    COORDINATION OF CARE: 2:19 PM-Discussed treatment plan which includes I&D and Rx antibiotics with pt at bedside and pt agreed to plan.   Labs Review Labs Reviewed - No data to display  Imaging Review No results found.   EKG Interpretation None      MDM   Final diagnoses:  Abscess   Patient presents with skin abscess multiple other skin lesions. Denies any other sxs at this time. Hx of MRSA infections. Incision and drainage performed in the ED today.  Erythematous nonraised lesion present on left UE not consistent with vasculitis or  phlebitis. Advised pt to return to the ED for wound recheck in 2 days. Pt sent home with Rx Clindamycin and Naprosyn. Patient given follow-up for dermatology regarding her recurrent skin infections. Discussed symptomatic treatment and wound care. The patient appears reasonably screened and/or stabilized for discharge and I doubt any other emergent medical condition requiring further screening, evaluation, or treatment in the ED prior to discharge.  I personally performed the services described in this documentation, which was scribed in my presence. The recorded information has been reviewed and is accurate.      Satira Sark [place], PA-C 08/07/15 1555  Samuel Jester, DO 08/09/15 1428

## 2015-08-07 NOTE — ED Notes (Signed)
Patient reports open wounds and abcess to left arm and leg. Reports that she woke up with them there. Hx of MRSA.

## 2015-08-07 NOTE — Discharge Instructions (Signed)
Take your medications as prescribed. I recommend eating prior to taking her prescription of naproxen to prevent gastrointestinal side effects. Keep wounds clean using antibacterial soap and water, pat dry. Return to the emergency department in 2-3 days for wound recheck. I recommend following up with the dermatology office above to schedule a follow-up appointment regarding further management of your  recurrent skin infections. Return to emergency department sooner if symptoms worsen or new onset of fever, redness, swelling, drainage, warmth, numbness, tingling, weakness, red streaking, abdominal pain, vomiting.

## 2015-08-11 MED FILL — SUBOXONE 8 MG-2 MG SL FILM: 8-2 | 7 days supply | Qty: 21 | Fill #0

## 2015-08-18 MED FILL — SUBOXONE 8 MG-2 MG SL FILM: 8-2 | 7 days supply | Qty: 21 | Fill #0

## 2015-08-24 MED FILL — SUBOXONE 8 MG-2 MG SL FILM: 8-2 | 7 days supply | Qty: 21 | Fill #0

## 2015-08-31 MED FILL — SUBOXONE 8 MG-2 MG SL FILM: 8-2 | 7 days supply | Qty: 21 | Fill #0

## 2015-09-07 MED FILL — SUBOXONE 8 MG-2 MG SL FILM: 8-2 | 7 days supply | Qty: 21 | Fill #0

## 2015-09-22 MED FILL — SUBOXONE 8 MG-2 MG SL FILM: 8-2 | 6 days supply | Qty: 18 | Fill #0

## 2015-09-28 MED FILL — SUBOXONE 8 MG-2 MG SL FILM: 8-2 | 7 days supply | Qty: 21 | Fill #0

## 2015-10-05 MED FILL — SUBOXONE 8 MG-2 MG SL FILM: 8-2 | 7 days supply | Qty: 21 | Fill #0

## 2015-10-12 MED FILL — SUBOXONE 8 MG-2 MG SL FILM: 8-2 | 7 days supply | Qty: 21 | Fill #0

## 2015-10-19 MED FILL — SUBOXONE 8 MG-2 MG SL FILM: 8-2 | 10 days supply | Qty: 30 | Fill #0

## 2015-10-29 MED FILL — SUBOXONE 8 MG-2 MG SL FILM: 8-2 | 7 days supply | Qty: 21 | Fill #0

## 2015-11-05 MED FILL — SUBOXONE 8 MG-2 MG SL FILM: 8-2 | 7 days supply | Qty: 21 | Fill #0

## 2015-11-12 MED FILL — SUBOXONE 8 MG-2 MG SL FILM: 8-2 | 14 days supply | Qty: 42 | Fill #0

## 2015-11-26 MED FILL — SUBOXONE 8 MG-2 MG SL FILM: 8-2 | 7 days supply | Qty: 21 | Fill #0

## 2015-12-03 MED FILL — SUBOXONE 8 MG-2 MG SL FILM: 8-2 | 7 days supply | Qty: 21 | Fill #0

## 2015-12-10 MED FILL — SUBOXONE 8 MG-2 MG SL FILM: 8-2 | 7 days supply | Qty: 21 | Fill #0

## 2015-12-17 MED FILL — SUBOXONE 8 MG-2 MG SL FILM: 8-2 | 14 days supply | Qty: 42 | Fill #0

## 2015-12-31 MED FILL — SUBOXONE 8 MG-2 MG SL FILM: 8-2 | 14 days supply | Qty: 42 | Fill #0

## 2016-01-14 MED FILL — SUBOXONE 8 MG-2 MG SL FILM: 8-2 | 28 days supply | Qty: 84 | Fill #0

## 2016-02-11 MED FILL — SUBOXONE 8 MG-2 MG SL FILM: 8-2 | 28 days supply | Qty: 84 | Fill #0

## 2016-03-15 MED FILL — SUBOXONE 8 MG-2 MG SL FILM: 8-2 | 28 days supply | Qty: 84 | Fill #0

## 2016-04-12 MED FILL — SUBOXONE 8 MG-2 MG SL FILM: 8-2 | 28 days supply | Qty: 84 | Fill #0

## 2016-05-05 MED FILL — SUBOXONE 8 MG-2 MG SL FILM: 8-2 | 28 days supply | Qty: 84 | Fill #0

## 2016-06-02 MED FILL — SUBOXONE 8 MG-2 MG SL FILM: 8-2 | 28 days supply | Qty: 84 | Fill #0

## 2016-06-28 MED FILL — SUBOXONE 8 MG-2 MG SL FILM: 8-2 | 28 days supply | Qty: 84 | Fill #0

## 2016-07-21 MED FILL — SUBOXONE 8 MG-2 MG SL FILM: 8-2 | 28 days supply | Qty: 84 | Fill #0

## 2016-08-16 MED FILL — SUBOXONE 8 MG-2 MG SL FILM: 8-2 | 28 days supply | Qty: 84 | Fill #0

## 2016-09-10 ENCOUNTER — Ambulatory Visit (HOSPITAL_COMMUNITY)
Admission: EM | Admit: 2016-09-10 | Discharge: 2016-09-10 | Disposition: A | Discharge status: Home / Self Care | Payer: Medicaid Other | Attending: Internal Medicine | Admitting: Internal Medicine

## 2016-09-10 ENCOUNTER — Encounter (HOSPITAL_COMMUNITY): Payer: Self-pay | Admitting: Family Medicine

## 2016-09-10 DIAGNOSIS — Z8619 Personal history of other infectious and parasitic diseases: Secondary | ICD-10-CM | POA: Diagnosis not present

## 2016-09-10 DIAGNOSIS — Z202 Contact with and (suspected) exposure to infections with a predominantly sexual mode of transmission: Secondary | ICD-10-CM

## 2016-09-10 DIAGNOSIS — Z113 Encounter for screening for infections with a predominantly sexual mode of transmission: Secondary | ICD-10-CM

## 2016-09-10 MED ORDER — ONDANSETRON 4 MG PO TBDP
ORAL_TABLET | ORAL | Status: AC
  Filled 2016-09-10: qty 2

## 2016-09-10 MED ORDER — AZITHROMYCIN 250 MG PO TABS
ORAL_TABLET | ORAL | Status: AC
  Filled 2016-09-10: qty 4

## 2016-09-10 MED ORDER — AZITHROMYCIN 250 MG PO TABS
1000.0000 mg | ORAL_TABLET | Freq: Once | ORAL | Status: AC
  Administered 2016-09-10: 1000 mg via ORAL

## 2016-09-10 MED ORDER — CEFTRIAXONE SODIUM 250 MG IJ SOLR
INTRAMUSCULAR | Status: AC
  Filled 2016-09-10: qty 250

## 2016-09-10 MED ORDER — ONDANSETRON 4 MG PO TBDP
8.0000 mg | ORAL_TABLET | Freq: Once | ORAL | Status: AC
  Administered 2016-09-10: 8 mg via ORAL

## 2016-09-10 MED ORDER — CEFTRIAXONE SODIUM 250 MG IJ SOLR
250.0000 mg | Freq: Once | INTRAMUSCULAR | Status: AC
  Administered 2016-09-10: 250 mg via INTRAMUSCULAR

## 2016-09-10 MED ORDER — LIDOCAINE HCL (PF) 1 % IJ SOLN
INTRAMUSCULAR | Status: AC
  Filled 2016-09-10: qty 2

## 2016-09-10 NOTE — Discharge Instructions (Addendum)
Urine test for gonorrhea/chlamydia/trichomonas were given at the urgent care today.  Injection of rocephin and dose of oral zithromax (antibiotics to cover possible silent infection with gonorrhea/chlamydia) were given at the urgent care today.  [facility] for discussion about management options for history of HPV infection.

## 2016-09-10 NOTE — ED Provider Notes (Signed)
[facility]    CSN: [id number] Arrival date & time: 09/10/16  1924     History   Chief Complaint Chief Complaint  Patient presents with  . Exposure to STD    HPI Loretta ElliotKacie Townsend is a 36 y.o. female. She presents today because a former partner called her and said that she needed to be tested for STDs. No unusual vaginal bleeding or discharge. No new pelvic discomfort. No urinary frequency/dysuria. No change in bowel habits. No new skin symptoms, no unusual joint pains. No fever, no malaise. 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    HPI  Past Medical History:  Diagnosis Date  . Asthma   . Complication of anesthesia    " I have a hard time waking & I cry "  . Depression    panic disorder  . MRSA (methicillin resistant staph aureus) culture positive   . Seizures [facility](HCC)     Patient Active Problem List   Diagnosis Date Noted  . Cellulitis 03/15/2012  . Seizure (HCC) 03/15/2012    Past Surgical History:  Procedure Laterality Date  . ADENOIDECTOMY    . TONSILLECTOMY    . TUBAL LIGATION       Home Medications    Prior to Admission medications   Medication Sig Start Date End Date Taking? Authorizing Provider  naproxen (NAPROSYN) 500 MG tablet Take 1 tablet (500 mg total) by mouth 2 (two) times daily. 08/07/15   Barrett HenleNadeau, Nicole Elizabeth, PA-C  SUBOXONE 8-2 MG FILM Place 1 each under the tongue 3 (three) times daily. 07/28/15   [provider]    Family History History reviewed. No pertinent family history.  Social History Social History  Substance Use Topics  . Smoking status: Former Smoker    Packs/day: 0.25    Quit date: 06/18/2013  . Smokeless tobacco: Never Used  . Alcohol use No     Allergies   Vancomycin; Amoxicillin-pot clavulanate; Erythromycin; Sudafed [pseudoephedrine hcl]; and Zithromax [azithromycin]   Review of Systems Review of Systems  All other systems reviewed and are negative.    Physical  Exam Triage Vital Signs ED Triage Vitals [09/10/16 1945]  Enc Vitals Group     BP 118/73     Pulse Rate 83     Resp 18     Temp 98.5 F (36.9 C)     Temp src      SpO2 100 %     Weight      Height      Pain Score      Pain Loc    Updated Vital Signs BP 118/73   Pulse 83   Temp 98.5 F (36.9 C)   Resp 18   LMP 09/09/2016   SpO2 100%   Physical Exam  Constitutional: She is oriented to person, place, and time. No distress.  HENT:  Head: Atraumatic.  Eyes:  Conjugate gaze observed, no eye redness/discharge  Neck: Neck supple.  Cardiovascular: Normal rate.   Pulmonary/Chest: No respiratory distress.  Abdominal: She exhibits no distension.  Musculoskeletal: Normal range of motion.  Neurological: She is alert and oriented to person, place, and time.  Skin: Skin is warm and dry.  Heavily tattooed  Nursing note and vitals reviewed.    UC Treatments / Results  Labs Urine cytology for GC/chlamydia/trichomonas pending  Procedures Procedures (including critical care time)  Medications Ordered in UC Medications  cefTRIAXone (ROCEPHIN) injection 250 mg (250 mg Intramuscular Given  09/10/16 2023)  azithromycin (ZITHROMAX) tablet 1,000 mg (1,000 mg Oral Given 09/10/16 2022)  ondansetron (ZOFRAN-ODT) disintegrating tablet 8 mg (8 mg Oral Given 09/10/16 2021)     Final Clinical Impressions(s) / UC Diagnoses   Final diagnoses:  Possible exposure to STD  History of HPV infection   Urine test for gonorrhea/chlamydia/trichomonas were given at the urgent care today.  Injection of rocephin and dose of oral zithromax (antibiotics to cover possible silent infection with gonorrhea/chlamydia) were given at the urgent care today.  Followup with [facility] for discussion about management options for history of HPV infection.      Eustace Moore, MD 09/10/16 2050

## 2016-09-10 NOTE — ED Triage Notes (Signed)
Pt her for STD check. Denies symptoms.

## 2016-09-12 LAB — URINE CYTOLOGY ANCILLARY ONLY
Chlamydia: NEGATIVE
NEISSERIA GONORRHEA: NEGATIVE
TRICH (WINDOWPATH): NEGATIVE

## 2016-09-13 MED FILL — SUBOXONE 8 MG-2 MG SL FILM: 8-2 | 28 days supply | Qty: 56 | Fill #0

## 2016-10-11 MED FILL — SUBOXONE 8 MG-2 MG SL FILM: 8-2 | 28 days supply | Qty: 84 | Fill #0

## 2016-11-09 MED FILL — SUBOXONE 8 MG-2 MG SL FILM: 8-2 | 28 days supply | Qty: 84 | Fill #0

## 2016-11-22 ENCOUNTER — Ambulatory Visit: Payer: Medicaid Other | Admitting: Obstetrics

## 2016-12-08 MED FILL — SUBOXONE 8 MG-2 MG SL FILM: 8-2 | 14 days supply | Qty: 42 | Fill #0

## 2016-12-12 ENCOUNTER — Ambulatory Visit: Payer: Medicaid Other | Admitting: Obstetrics

## 2016-12-22 MED FILL — SUBOXONE 8 MG-2 MG SL FILM: 8-2 | 14 days supply | Qty: 42 | Fill #0

## 2016-12-27 ENCOUNTER — Ambulatory Visit: Payer: Medicaid Other | Admitting: Neurology

## 2016-12-27 ENCOUNTER — Telehealth: Payer: Self-pay | Admitting: Neurology

## 2016-12-27 NOTE — Telephone Encounter (Signed)
This patient does not show for a new patient visit today.

## 2016-12-28 ENCOUNTER — Encounter: Payer: Self-pay | Admitting: Neurology

## 2017-01-03 MED FILL — NARCAN 4 MG NASAL SPRAY: 4 | 1 days supply | Qty: 1 | Fill #0

## 2017-01-03 MED FILL — SUBOXONE 8 MG-2 MG SL FILM: 8-2 | 14 days supply | Qty: 42 | Fill #0

## 2017-01-17 MED FILL — SUBOXONE 8 MG-2 MG SL FILM: 8-2 | 14 days supply | Qty: 42 | Fill #0

## 2017-01-31 MED FILL — SUBOXONE 8 MG-2 MG SL FILM: 8-2 | 14 days supply | Qty: 42 | Fill #0

## 2017-02-14 MED FILL — SUBOXONE 8 MG-2 MG SL FILM: 8-2 | 23 days supply | Qty: 69 | Fill #0

## 2017-02-25 ENCOUNTER — Encounter (HOSPITAL_COMMUNITY): Payer: Self-pay | Admitting: Emergency Medicine

## 2017-02-25 ENCOUNTER — Emergency Department (HOSPITAL_COMMUNITY)
Admission: EM | Admit: 2017-02-25 | Discharge: 2017-02-26 | Disposition: A | Discharge status: Home / Self Care | Payer: Medicaid Other | Attending: Emergency Medicine | Admitting: Emergency Medicine

## 2017-02-25 DIAGNOSIS — R109 Unspecified abdominal pain: Secondary | ICD-10-CM | POA: Diagnosis present

## 2017-02-25 DIAGNOSIS — R197 Diarrhea, unspecified: Secondary | ICD-10-CM | POA: Diagnosis not present

## 2017-02-25 DIAGNOSIS — J45909 Unspecified asthma, uncomplicated: Secondary | ICD-10-CM | POA: Diagnosis not present

## 2017-02-25 DIAGNOSIS — L0390 Cellulitis, unspecified: Secondary | ICD-10-CM

## 2017-02-25 DIAGNOSIS — B9562 Methicillin resistant Staphylococcus aureus infection as the cause of diseases classified elsewhere: Secondary | ICD-10-CM | POA: Diagnosis not present

## 2017-02-25 DIAGNOSIS — Z87891 Personal history of nicotine dependence: Secondary | ICD-10-CM | POA: Insufficient documentation

## 2017-02-25 DIAGNOSIS — Z79899 Other long term (current) drug therapy: Secondary | ICD-10-CM | POA: Insufficient documentation

## 2017-02-25 LAB — URINALYSIS, ROUTINE W REFLEX MICROSCOPIC
BILIRUBIN URINE: NEGATIVE
Glucose, UA: NEGATIVE mg/dL
HGB URINE DIPSTICK: NEGATIVE
KETONES UR: NEGATIVE mg/dL
Leukocytes, UA: NEGATIVE
NITRITE: NEGATIVE
PROTEIN: NEGATIVE mg/dL
SPECIFIC GRAVITY, URINE: 1.021 (ref 1.005–1.030)
pH: 5 (ref 5.0–8.0)

## 2017-02-25 LAB — CBC
HCT: 39.6 % (ref 36.0–46.0)
HEMOGLOBIN: 13.1 g/dL (ref 12.0–15.0)
MCH: 29 pg (ref 26.0–34.0)
MCHC: 33.1 g/dL (ref 30.0–36.0)
MCV: 87.6 fL (ref 78.0–100.0)
PLATELETS: 222 10*3/uL (ref 150–400)
RBC: 4.52 MIL/uL (ref 3.87–5.11)
RDW: 13.4 % (ref 11.5–15.5)
WBC: 4.9 10*3/uL (ref 4.0–10.5)

## 2017-02-25 LAB — LIPASE, BLOOD: Lipase: 19 U/L (ref 11–51)

## 2017-02-25 LAB — COMPREHENSIVE METABOLIC PANEL
ALBUMIN: 4.4 g/dL (ref 3.5–5.0)
ALK PHOS: 33 U/L — AB (ref 38–126)
ALT: 12 U/L — AB (ref 14–54)
ANION GAP: 9 (ref 5–15)
AST: 19 U/L (ref 15–41)
BILIRUBIN TOTAL: 0.4 mg/dL (ref 0.3–1.2)
BUN: 16 mg/dL (ref 6–20)
CALCIUM: 9.3 mg/dL (ref 8.9–10.3)
CO2: 28 mmol/L (ref 22–32)
CREATININE: 0.78 mg/dL (ref 0.44–1.00)
Chloride: 104 mmol/L (ref 101–111)
GFR calc non Af Amer: >60 mL/min (ref >60)
GLUCOSE: 89 mg/dL (ref 65–99)
Potassium: 3.2 mmol/L — ABNORMAL LOW (ref 3.5–5.1)
Sodium: 141 mmol/L (ref 135–145)
TOTAL PROTEIN: 7.8 g/dL (ref 6.5–8.1)

## 2017-02-25 LAB — POC URINE PREG, ED: PREG TEST UR: NEGATIVE

## 2017-02-25 MED ORDER — METRONIDAZOLE IN NACL 5-0.79 MG/ML-% IV SOLN
500.0000 mg | Freq: Once | INTRAVENOUS | Status: AC
  Administered 2017-02-26: 500 mg via INTRAVENOUS
  Filled 2017-02-25: qty 100

## 2017-02-25 MED ORDER — CLINDAMYCIN PHOSPHATE 600 MG/50ML IV SOLN
600.0000 mg | Freq: Once | INTRAVENOUS | Status: AC
  Administered 2017-02-26: 600 mg via INTRAVENOUS
  Filled 2017-02-25: qty 50

## 2017-02-25 MED ORDER — SODIUM CHLORIDE 0.9 % IV BOLUS (SEPSIS)
1000.0000 mL | Freq: Once | INTRAVENOUS | Status: AC
  Administered 2017-02-26: 1000 mL via INTRAVENOUS

## 2017-02-25 MED ORDER — ONDANSETRON 4 MG PO TBDP
4.0000 mg | ORAL_TABLET | Freq: Once | ORAL | Status: AC | PRN
  Administered 2017-02-25: 4 mg via ORAL
  Filled 2017-02-25: qty 1

## 2017-02-25 NOTE — ED Provider Notes (Signed)
[facility]-EMERGENCY DEPT Provider Note   CSN: [id number] Arrival date & time: 02/25/17  2010     History   Chief Complaint Chief Complaint  Patient presents with  . Abdominal Pain    HPI [name] is a 36 y.o. female history of MRSA with abscess, asthma here presenting with loose stools, skin lesions.  Patient states that she has previous MRSA and noticed some spots on her arms that has been spontaneously draining.  Denies any fevers or chills.  Patient states that for the last several days, she has been having some diffuse abdominal cramping and was constipated but then had some greenish and whitish stools.  Feeling nauseated but has no vomiting.  Patient states that she may have history of Crohn's disease but actually had a CT abdomen pelvis several years ago that showed possible colitis but never had a colonoscopy to confirm the diagnosis.  Patient never saw GI doctor and is not currently on medicines for colitis.  She denies any recent travel or eating uncooked food.  The history is provided by the patient.    Past Medical History:  Diagnosis Date  . Asthma   . Complication of anesthesia    " I have a hard time waking & I cry "  . Depression    panic disorder  . MRSA (methicillin resistant staph aureus) culture positive   . Seizures [facility])     Patient Active Problem List   Diagnosis Date Noted  . Cellulitis 03/15/2012  . Seizure (HCC) 03/15/2012    Past Surgical History:  Procedure Laterality Date  . ADENOIDECTOMY    . TONSILLECTOMY    . TUBAL LIGATION      OB History    No data available       Home Medications    Prior to Admission medications   Medication Sig Start Date End Date Taking? Authorizing Provider  ibuprofen (ADVIL,MOTRIN) 200 MG tablet Take 400 mg by mouth every 6 (six) hours as needed for moderate pain.   Yes [provider]  SUBOXONE 8-2 MG FILM Place 1 each under the tongue 3 (three) times daily. 07/28/15  Yes  [provider]  naproxen (NAPROSYN) 500 MG tablet Take 1 tablet (500 mg total) by mouth 2 (two) times daily. Patient not taking: Reported on 02/25/2017 08/07/15   Barrett Henle, PA-C    Family History History reviewed. No pertinent family history.  Social History Social History  Substance Use Topics  . Smoking status: Former Smoker    Packs/day: 0.25    Quit date: 06/18/2013  . Smokeless tobacco: Never Used  . Alcohol use No     Allergies   Vancomycin; Amoxicillin-pot clavulanate; Erythromycin; Sudafed [pseudoephedrine hcl]; and Zithromax [azithromycin]   Review of Systems Review of Systems  Gastrointestinal: Positive for abdominal pain and nausea.  All other systems reviewed and are negative.    Physical Exam Updated Vital Signs BP (!) 137/98 (BP Location: Left Arm)   Pulse (!) 101   Temp 98.5 F (36.9 C) (Oral)   Resp 18   Ht 5\' 7"  (1.702 m)   Wt 58.1 kg (128 lb)   LMP 02/06/2017   SpO2 97%   BMI 20.05 kg/m   Physical Exam  Constitutional: She is oriented to person, place, and time. She appears well-developed.  HENT:  Head: Normocephalic.  MM slightly dry   Eyes: Pupils are equal, round, and reactive to light. Conjunctivae and EOM are normal.  Neck: Normal range  of motion. Neck supple.  Cardiovascular: Normal rate, regular rhythm and normal heart sounds.   Pulmonary/Chest: Effort normal and breath sounds normal. No respiratory distress. She has no wheezes. She has no rales.  Abdominal: Soft. Bowel sounds are normal.  Mild epigastric tenderness   Musculoskeletal: Normal range of motion.  Neurological: She is alert and oriented to person, place, and time.  Skin: Skin is warm.  Multiple skin lesions on the arms and legs that are self draining. No obvious abscess  Psychiatric: She has a normal mood and affect.  Nursing note and vitals reviewed.    ED Treatments / Results  Labs (all labs ordered are listed, but only abnormal results are  displayed) Labs Reviewed  COMPREHENSIVE METABOLIC PANEL - Abnormal; Notable for the following:       Result Value   Potassium 3.2 (*)    ALT 12 (*)    Alkaline Phosphatase 33 (*)    All other components within normal limits  URINALYSIS, ROUTINE W REFLEX MICROSCOPIC - Abnormal; Notable for the following:    APPearance HAZY (*)    All other components within normal limits  LIPASE, BLOOD  CBC  POC URINE PREG, ED    EKG  EKG Interpretation None       Radiology No results found.  Procedures Procedures (including critical care time)  Medications Ordered in ED Medications  sodium chloride 0.9 % bolus 1,000 mL (not administered)  clindamycin (CLEOCIN) IVPB 600 mg (not administered)  metroNIDAZOLE (FLAGYL) IVPB 500 mg (not administered)  ondansetron (ZOFRAN-ODT) disintegrating tablet 4 mg (4 mg Oral Given 02/25/17 2036)     Initial Impression / Assessment and Plan / ED Course  I have reviewed the triage vital signs and the nursing notes.  Pertinent labs & imaging results that were available during my care of the patient were reviewed by me and considered in my medical decision making (see chart for details).     Loretta ElliotKacie Townsend is a 36 y.o. female here with nausea, skin lesions. 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 She also has gastro symptoms. Reviewed previous CT which showed colitis and never had colonoscopy to confirm Crohn's disease. Has no fever and minimal abdominal tenderness and I doubt colitis currently. Will get CBC, CMP, lipase. Will give IVF, clinda for MRSA, flagyl for possible colitis.   1:12 AM WBC nl. UA nl. Given clinda for MRSA, flagyl for possible colitis vs gastro. Will dc home with same.   Final Clinical Impressions(s) / ED Diagnoses   Final diagnoses:  None    New Prescriptions New Prescriptions   No medications on file     [name],  Hsienta, MD 02/26/17 [id number]

## 2017-02-25 NOTE — ED Triage Notes (Signed)
Pt reports having change in bowel habits and noticed a white noodle appearance of stool. Pt also reports abdominal cramping. 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

## 2017-02-26 MED ORDER — METOCLOPRAMIDE HCL 10 MG PO TABS: 10.0000 mg | ORAL_TABLET | Freq: Four times a day (QID) | ORAL | 0 refills | Status: DC | PRN

## 2017-02-26 MED ORDER — CLINDAMYCIN HCL 300 MG PO CAPS: 300.0000 mg | ORAL_CAPSULE | Freq: Three times a day (TID) | ORAL | 0 refills | Status: DC

## 2017-02-26 MED ORDER — METRONIDAZOLE 500 MG PO TABS: 500.0000 mg | ORAL_TABLET | Freq: Two times a day (BID) | ORAL | 0 refills | Status: DC

## 2017-02-26 NOTE — Discharge Instructions (Signed)
Stay hydrated.   Take reglan for nausea.   Take clindamycin for cellulitis.   Take flagyl to help with diarrhea.   See your doctor. Consider following with with GI if you have persistent diarrhea and pain   Return to ER if you have worse abdominal pain, vomiting, fever, rash, abscess

## 2017-02-27 ENCOUNTER — Ambulatory Visit: Payer: Medicaid Other | Admitting: Obstetrics

## 2017-02-27 ENCOUNTER — Other Ambulatory Visit (HOSPITAL_COMMUNITY)
Admission: RE | Admit: 2017-02-27 | Discharge: 2017-02-27 | Disposition: A | Discharge status: Home / Self Care | Payer: Medicaid Other | Source: Ambulatory Visit | Attending: Obstetrics | Admitting: Obstetrics

## 2017-02-27 ENCOUNTER — Encounter: Payer: Self-pay | Admitting: Obstetrics

## 2017-02-27 VITALS — BP 104/72 | HR 84 | Ht 67.0 in | Wt 126.0 lb

## 2017-02-27 DIAGNOSIS — Z0000 Encounter for general adult medical examination without abnormal findings: Secondary | ICD-10-CM | POA: Diagnosis not present

## 2017-02-27 DIAGNOSIS — Z01419 Encounter for gynecological examination (general) (routine) without abnormal findings: Secondary | ICD-10-CM | POA: Insufficient documentation

## 2017-02-27 DIAGNOSIS — N898 Other specified noninflammatory disorders of vagina: Secondary | ICD-10-CM | POA: Diagnosis present

## 2017-02-27 DIAGNOSIS — Z3049 Encounter for surveillance of other contraceptives: Secondary | ICD-10-CM

## 2017-02-27 NOTE — Progress Notes (Signed)
AEX/PAP/STD

## 2017-02-27 NOTE — Progress Notes (Signed)
Subjective:        [name] is a 36 y.o. female here for a routine exam.  Current complaints: None.    Personal health questionnaire:  Is patient Ashkenazi Jewish, have a family history of breast and/or ovarian cancer: no Is there a family history of uterine cancer diagnosed at age < 35, gastrointestinal cancer, urinary tract cancer, family member who is a Personnel officer syndrome-associated carrier: no Is the patient overweight and hypertensive, family history of diabetes, personal history of gestational diabetes, preeclampsia or PCOS: no Is patient over 21, have PCOS,  family history of premature CHD under age 79, diabetes, smoke, have hypertension or peripheral artery disease:  no At any time, has a partner hit, kicked or otherwise hurt or frightened you?: no Over the past 2 weeks, have you felt down, depressed or hopeless?: no Over the past 2 weeks, have you felt little interest or pleasure in doing things?:no   Gynecologic History Patient's last menstrual period was 02/06/2017. Contraception: tubal ligation Last Pap: ~ 5 years ago. Results were: normal Last mammogram: n/a. Results were: n/a  Obstetric History OB History  Gravida Para Term Preterm AB Living  3 3 3     3   SAB TAB Ectopic Multiple Live Births               # Outcome Date GA Lbr Len/2nd Weight Sex Delivery Anes PTL Lv  3 Term           2 Term           1 Term               Past Medical History:  Diagnosis Date  . Anxiety   . Asthma   . Complication of anesthesia    " I have a hard time waking & I cry "  . Depression    panic disorder  . MRSA (methicillin resistant staph aureus) culture positive   . Seizures (HCC)   . Vaginal Pap smear, abnormal     Past Surgical History:  Procedure Laterality Date  . ADENOIDECTOMY    . TONSILLECTOMY    . TUBAL LIGATION       Current Outpatient Medications:  .  clindamycin (CLEOCIN) 300 MG capsule, Take 1 capsule (300 mg total) by mouth 3 (three) times daily. X  7 days, Disp: 21 capsule, Rfl: 0 .  ibuprofen (ADVIL,MOTRIN) 200 MG tablet, Take 400 mg by mouth every 6 (six) hours as needed for moderate pain., Disp: , Rfl:  .  metoCLOPramide (REGLAN) 10 MG tablet, Take 1 tablet (10 mg total) by mouth every 6 (six) hours as needed for nausea (nausea/headache)., Disp: 6 tablet, Rfl: 0 .  metroNIDAZOLE (FLAGYL) 500 MG tablet, Take 1 tablet (500 mg total) by mouth 2 (two) times daily. One po bid x 7 days, Disp: 14 tablet, Rfl: 0 .  naproxen (NAPROSYN) 500 MG tablet, Take 1 tablet (500 mg total) by mouth 2 (two) times daily. (Patient not taking: Reported on 02/25/2017), Disp: 30 tablet, Rfl: 0 .  SUBOXONE 8-2 MG FILM, Place 1 each under the tongue 3 (three) times daily., Disp: , Rfl: 0 Allergies  Allergen Reactions  . Vancomycin Hives and Itching    Has patient had a PCN reaction causing immediate rash, facial/tongue/throat swelling, SOB or lightheadedness with hypotension: Yes  Has patient had a PCN reaction causing severe rash involving mucus membranes or skin necrosis: Yes Has patient had a PCN reaction that required hospitalization: Was in  hospital when happened in 2014  Has patient had a PCN reaction occurring within the last 10 years: Yes  If all of the above answers are "NO", then may proceed with Cephalosporin use.   Marland Kitchen. Amoxicillin-Pot Clavulanate Nausea And Vomiting and Rash    Has patient had a PCN reaction causing immediate rash, facial/tongue/throat swelling, SOB or lightheadedness with hypotension: Yes Has patient had a PCN reaction causing severe rash involving mucus membranes or skin necrosis: Yes Has patient had a PCN reaction that required hospitalization No Has patient had a PCN reaction occurring within the last 10 years: No If all of the above answers are "NO", then may proceed with Cephalosporin use.   . Erythromycin Nausea And Vomiting and Rash  . Sudafed [Pseudoephedrine Hcl] Rash and Other (See Comments)    Makes heart race  . Zithromax  [Azithromycin] Nausea And Vomiting and Rash    Social History   Tobacco Use  . Smoking status: Former Smoker    Packs/day: 0.25    Last attempt to quit: 06/18/2013    Years since quitting: 3.6  . Smokeless tobacco: Never Used  Substance Use Topics  . Alcohol use: No    Family History  Problem Relation Age of Onset  . Arthritis Mother   . Diabetes Mother   . Hypertension Mother   . Arthritis Father   . Asthma Daughter   . Asthma Son   . Birth defects Son   . Alcohol abuse Maternal Aunt   . Alcohol abuse Paternal Aunt   . Cancer Paternal Aunt   . Alcohol abuse Maternal Grandfather   . Cancer Maternal Grandfather   . Cancer Paternal Grandmother       Review of Systems  Constitutional: negative for fatigue and weight loss Respiratory: negative for cough and wheezing Cardiovascular: negative for chest pain, fatigue and palpitations Gastrointestinal: negative for abdominal pain and change in bowel habits Musculoskeletal:negative for myalgias Neurological: negative for gait problems and tremors Behavioral/Psych: negative for abusive relationship, depression Endocrine: negative for temperature intolerance    Genitourinary:negative for abnormal menstrual periods, genital lesions, hot flashes, sexual problems and vaginal discharge Integument/breast: negative for breast lump, breast tenderness, nipple discharge and skin lesion(s)    Objective:       BP 104/72   Pulse 84   Ht 5\' 7"  (1.702 m)   Wt 126 lb (57.2 kg)   LMP 02/06/2017   BMI 19.73 kg/m  General:   alert  Skin:   no rash or abnormalities  Lungs:   clear to auscultation bilaterally  Heart:   regular rate and rhythm, S1, S2 normal, no murmur, click, rub or gallop  Breasts:   normal without suspicious masses, skin or nipple changes or axillary nodes  Abdomen:  normal findings: no organomegaly, soft, non-tender and no hernia  Pelvis:  External genitalia: normal general appearance Urinary system: urethral meatus  normal and bladder without fullness, nontender Vaginal: normal without tenderness, induration or masses Cervix: normal appearance Adnexa: normal bimanual exam Uterus: anteverted and non-tender, normal size   Lab Review Urine pregnancy test Labs reviewed yes Radiologic studies reviewed no  50% of 20 min visit spent on counseling and coordination of care.    Assessment:     1. Encounter for routine gynecological examination with Papanicolaou smear of cervix Rx: - Cytology - PAP  2. Encounter for surveillance of other contraceptive   3. Vaginal discharge Rx: - Cervicovaginal ancillary only   Plan:    Education reviewed: calcium supplements,  depression evaluation, low fat, low cholesterol diet, safe sex/STD prevention, self breast exams, skin cancer screening and weight bearing exercise. Contraception: tubal ligation. Follow up in: 1 year.   No orders of the defined types were placed in this encounter.  No orders of the defined types were placed in this encounter.

## 2017-02-28 LAB — CERVICOVAGINAL ANCILLARY ONLY
Bacterial vaginitis: POSITIVE — AB
Candida vaginitis: NEGATIVE
Chlamydia: NEGATIVE
NEISSERIA GONORRHEA: NEGATIVE
TRICH (WINDOWPATH): NEGATIVE

## 2017-02-28 LAB — CYTOLOGY - PAP
DIAGNOSIS: NEGATIVE
HPV (WINDOPATH): NOT DETECTED

## 2017-03-01 ENCOUNTER — Other Ambulatory Visit: Payer: Self-pay | Admitting: Obstetrics

## 2017-03-09 MED FILL — SUBOXONE 8 MG-2 MG SL FILM: 8-2 | 14 days supply | Qty: 42 | Fill #0

## 2017-03-24 MED FILL — SUBOXONE 8 MG-2 MG SL FILM: 8-2 | 28 days supply | Qty: 84 | Fill #0

## 2017-04-20 MED FILL — SUBOXONE 8 MG-2 MG SL FILM: 8-2 | 28 days supply | Qty: 84 | Fill #0

## 2017-05-16 MED FILL — SUBOXONE 8 MG-2 MG SL FILM: 8-2 | 28 days supply | Qty: 84 | Fill #0

## 2017-06-13 MED FILL — SUBOXONE 8 MG-2 MG SL FILM: 8-2 | 28 days supply | Qty: 84 | Fill #0

## 2017-07-11 MED FILL — SUBOXONE 8 MG-2 MG SL FILM: 8-2 | 28 days supply | Qty: 84 | Fill #0

## 2017-08-08 MED FILL — SUBOXONE 8 MG-2 MG SL FILM: 8-2 | 28 days supply | Qty: 84 | Fill #0

## 2017-08-23 DIAGNOSIS — Z9289 Personal history of other medical treatment: Secondary | ICD-10-CM

## 2017-08-23 HISTORY — DX: Personal history of other medical treatment: Z92.89

## 2017-08-30 ENCOUNTER — Inpatient Hospital Stay (HOSPITAL_COMMUNITY): Payer: Medicaid Other

## 2017-08-30 ENCOUNTER — Emergency Department (HOSPITAL_COMMUNITY): Payer: Medicaid Other

## 2017-08-30 ENCOUNTER — Inpatient Hospital Stay (HOSPITAL_COMMUNITY): Payer: Medicaid Other | Admitting: Certified Registered Nurse Anesthetist

## 2017-08-30 ENCOUNTER — Encounter (HOSPITAL_COMMUNITY): Admission: EM | Disposition: A | Discharge status: Rehabilitation Facility | Payer: Self-pay | Source: Home / Self Care

## 2017-08-30 ENCOUNTER — Encounter (HOSPITAL_COMMUNITY): Payer: Self-pay | Admitting: Emergency Medicine

## 2017-08-30 ENCOUNTER — Inpatient Hospital Stay (HOSPITAL_COMMUNITY)
Admission: EM | Admit: 2017-08-30 | Discharge: 2017-09-08 | DRG: 956 | Disposition: A | Discharge status: Rehabilitation Facility | Payer: Medicaid Other | Attending: Student | Admitting: Student

## 2017-08-30 DIAGNOSIS — R402252 Coma scale, best verbal response, oriented, at arrival to emergency department: Secondary | ICD-10-CM | POA: Diagnosis present

## 2017-08-30 DIAGNOSIS — F17210 Nicotine dependence, cigarettes, uncomplicated: Secondary | ICD-10-CM | POA: Diagnosis present

## 2017-08-30 DIAGNOSIS — R52 Pain, unspecified: Secondary | ICD-10-CM

## 2017-08-30 DIAGNOSIS — G8918 Other acute postprocedural pain: Secondary | ICD-10-CM | POA: Diagnosis not present

## 2017-08-30 DIAGNOSIS — S0181XA Laceration without foreign body of other part of head, initial encounter: Secondary | ICD-10-CM | POA: Diagnosis present

## 2017-08-30 DIAGNOSIS — M79673 Pain in unspecified foot: Secondary | ICD-10-CM

## 2017-08-30 DIAGNOSIS — S82252C Displaced comminuted fracture of shaft of left tibia, initial encounter for open fracture type IIIA, IIIB, or IIIC: Secondary | ICD-10-CM

## 2017-08-30 DIAGNOSIS — R402362 Coma scale, best motor response, obeys commands, at arrival to emergency department: Secondary | ICD-10-CM | POA: Diagnosis present

## 2017-08-30 DIAGNOSIS — E46 Unspecified protein-calorie malnutrition: Secondary | ICD-10-CM | POA: Diagnosis not present

## 2017-08-30 DIAGNOSIS — R102 Pelvic and perineal pain: Secondary | ICD-10-CM | POA: Diagnosis not present

## 2017-08-30 DIAGNOSIS — K5900 Constipation, unspecified: Secondary | ICD-10-CM | POA: Diagnosis present

## 2017-08-30 DIAGNOSIS — R402142 Coma scale, eyes open, spontaneous, at arrival to emergency department: Secondary | ICD-10-CM | POA: Diagnosis present

## 2017-08-30 DIAGNOSIS — F1111 Opioid abuse, in remission: Secondary | ICD-10-CM

## 2017-08-30 DIAGNOSIS — S82432C Displaced oblique fracture of shaft of left fibula, initial encounter for open fracture type IIIA, IIIB, or IIIC: Secondary | ICD-10-CM | POA: Diagnosis present

## 2017-08-30 DIAGNOSIS — F4323 Adjustment disorder with mixed anxiety and depressed mood: Secondary | ICD-10-CM | POA: Diagnosis present

## 2017-08-30 DIAGNOSIS — I878 Other specified disorders of veins: Secondary | ICD-10-CM

## 2017-08-30 DIAGNOSIS — T1490XA Injury, unspecified, initial encounter: Secondary | ICD-10-CM

## 2017-08-30 DIAGNOSIS — T07XXXA Unspecified multiple injuries, initial encounter: Secondary | ICD-10-CM | POA: Diagnosis not present

## 2017-08-30 DIAGNOSIS — Z8781 Personal history of (healed) traumatic fracture: Secondary | ICD-10-CM | POA: Diagnosis not present

## 2017-08-30 DIAGNOSIS — S52551A Other extraarticular fracture of lower end of right radius, initial encounter for closed fracture: Secondary | ICD-10-CM | POA: Diagnosis present

## 2017-08-30 DIAGNOSIS — D62 Acute posthemorrhagic anemia: Secondary | ICD-10-CM | POA: Diagnosis present

## 2017-08-30 DIAGNOSIS — S7222XC Displaced subtrochanteric fracture of left femur, initial encounter for open fracture type IIIA, IIIB, or IIIC: Secondary | ICD-10-CM | POA: Diagnosis present

## 2017-08-30 DIAGNOSIS — S32810A Multiple fractures of pelvis with stable disruption of pelvic ring, initial encounter for closed fracture: Secondary | ICD-10-CM

## 2017-08-30 DIAGNOSIS — F1110 Opioid abuse, uncomplicated: Secondary | ICD-10-CM | POA: Diagnosis present

## 2017-08-30 DIAGNOSIS — S3720XA Unspecified injury of bladder, initial encounter: Secondary | ICD-10-CM | POA: Diagnosis not present

## 2017-08-30 DIAGNOSIS — F1290 Cannabis use, unspecified, uncomplicated: Secondary | ICD-10-CM | POA: Diagnosis not present

## 2017-08-30 DIAGNOSIS — S3729XA Other injury of bladder, initial encounter: Secondary | ICD-10-CM | POA: Diagnosis present

## 2017-08-30 DIAGNOSIS — Z87898 Personal history of other specified conditions: Secondary | ICD-10-CM | POA: Diagnosis not present

## 2017-08-30 DIAGNOSIS — E876 Hypokalemia: Secondary | ICD-10-CM

## 2017-08-30 DIAGNOSIS — S0121XA Laceration without foreign body of nose, initial encounter: Secondary | ICD-10-CM | POA: Diagnosis present

## 2017-08-30 DIAGNOSIS — Z79899 Other long term (current) drug therapy: Secondary | ICD-10-CM

## 2017-08-30 DIAGNOSIS — F411 Generalized anxiety disorder: Secondary | ICD-10-CM | POA: Diagnosis not present

## 2017-08-30 DIAGNOSIS — T148XXA Other injury of unspecified body region, initial encounter: Secondary | ICD-10-CM | POA: Diagnosis not present

## 2017-08-30 DIAGNOSIS — S7222XS Displaced subtrochanteric fracture of left femur, sequela: Secondary | ICD-10-CM | POA: Diagnosis not present

## 2017-08-30 DIAGNOSIS — S32462S Displaced associated transverse-posterior fracture of left acetabulum, sequela: Secondary | ICD-10-CM | POA: Diagnosis not present

## 2017-08-30 DIAGNOSIS — S36031A Moderate laceration of spleen, initial encounter: Secondary | ICD-10-CM | POA: Diagnosis present

## 2017-08-30 DIAGNOSIS — F1910 Other psychoactive substance abuse, uncomplicated: Secondary | ICD-10-CM | POA: Diagnosis present

## 2017-08-30 DIAGNOSIS — R450 Nervousness: Secondary | ICD-10-CM | POA: Diagnosis not present

## 2017-08-30 DIAGNOSIS — Z8659 Personal history of other mental and behavioral disorders: Secondary | ICD-10-CM | POA: Diagnosis not present

## 2017-08-30 DIAGNOSIS — S42022A Displaced fracture of shaft of left clavicle, initial encounter for closed fracture: Secondary | ICD-10-CM | POA: Diagnosis present

## 2017-08-30 DIAGNOSIS — R310 Gross hematuria: Secondary | ICD-10-CM | POA: Diagnosis present

## 2017-08-30 DIAGNOSIS — Z881 Allergy status to other antibiotic agents status: Secondary | ICD-10-CM

## 2017-08-30 DIAGNOSIS — S36039A Unspecified laceration of spleen, initial encounter: Secondary | ICD-10-CM

## 2017-08-30 DIAGNOSIS — S52502S Unspecified fracture of the lower end of left radius, sequela: Secondary | ICD-10-CM | POA: Diagnosis not present

## 2017-08-30 DIAGNOSIS — J45909 Unspecified asthma, uncomplicated: Secondary | ICD-10-CM | POA: Diagnosis present

## 2017-08-30 DIAGNOSIS — Z452 Encounter for adjustment and management of vascular access device: Secondary | ICD-10-CM

## 2017-08-30 DIAGNOSIS — F1390 Sedative, hypnotic, or anxiolytic use, unspecified, uncomplicated: Secondary | ICD-10-CM | POA: Diagnosis not present

## 2017-08-30 DIAGNOSIS — M79606 Pain in leg, unspecified: Secondary | ICD-10-CM | POA: Diagnosis not present

## 2017-08-30 DIAGNOSIS — Z419 Encounter for procedure for purposes other than remedying health state, unspecified: Secondary | ICD-10-CM

## 2017-08-30 DIAGNOSIS — M7989 Other specified soft tissue disorders: Secondary | ICD-10-CM | POA: Diagnosis not present

## 2017-08-30 DIAGNOSIS — F310 Bipolar disorder, current episode hypomanic: Secondary | ICD-10-CM | POA: Diagnosis not present

## 2017-08-30 DIAGNOSIS — S52591S Other fractures of lower end of right radius, sequela: Secondary | ICD-10-CM | POA: Diagnosis not present

## 2017-08-30 DIAGNOSIS — S82222B Displaced transverse fracture of shaft of left tibia, initial encounter for open fracture type I or II: Secondary | ICD-10-CM

## 2017-08-30 DIAGNOSIS — Z880 Allergy status to penicillin: Secondary | ICD-10-CM | POA: Diagnosis not present

## 2017-08-30 DIAGNOSIS — S82232C Displaced oblique fracture of shaft of left tibia, initial encounter for open fracture type IIIA, IIIB, or IIIC: Secondary | ICD-10-CM | POA: Diagnosis present

## 2017-08-30 DIAGNOSIS — F319 Bipolar disorder, unspecified: Secondary | ICD-10-CM | POA: Diagnosis present

## 2017-08-30 DIAGNOSIS — S7222XE Displaced subtrochanteric fracture of left femur, subsequent encounter for open fracture type I or II with routine healing: Secondary | ICD-10-CM | POA: Diagnosis not present

## 2017-08-30 DIAGNOSIS — Z915 Personal history of self-harm: Secondary | ICD-10-CM | POA: Diagnosis not present

## 2017-08-30 DIAGNOSIS — L0390 Cellulitis, unspecified: Secondary | ICD-10-CM | POA: Diagnosis not present

## 2017-08-30 DIAGNOSIS — F1490 Cocaine use, unspecified, uncomplicated: Secondary | ICD-10-CM | POA: Diagnosis not present

## 2017-08-30 DIAGNOSIS — S72092B Other fracture of head and neck of left femur, initial encounter for open fracture type I or II: Secondary | ICD-10-CM | POA: Diagnosis not present

## 2017-08-30 DIAGNOSIS — G894 Chronic pain syndrome: Secondary | ICD-10-CM | POA: Diagnosis not present

## 2017-08-30 DIAGNOSIS — S32452A Displaced transverse fracture of left acetabulum, initial encounter for closed fracture: Secondary | ICD-10-CM | POA: Diagnosis present

## 2017-08-30 DIAGNOSIS — S01511A Laceration without foreign body of lip, initial encounter: Secondary | ICD-10-CM | POA: Diagnosis present

## 2017-08-30 HISTORY — PX: FEMUR IM NAIL: SHX1597

## 2017-08-30 HISTORY — DX: Bipolar disorder, unspecified: F31.9

## 2017-08-30 HISTORY — DX: Unspecified asthma, uncomplicated: J45.909

## 2017-08-30 HISTORY — PX: FACIAL LACERATIONS REPAIR: SHX1571

## 2017-08-30 HISTORY — PX: I&D EXTREMITY: SHX5045

## 2017-08-30 HISTORY — PX: TIBIA IM NAIL INSERTION: SHX2516

## 2017-08-30 LAB — PROTIME-INR
INR: 1.14
Prothrombin Time: 14.5 seconds (ref 11.4–15.2)

## 2017-08-30 LAB — COMPREHENSIVE METABOLIC PANEL
ALBUMIN: 3.6 g/dL (ref 3.5–5.0)
ALT: 70 U/L — ABNORMAL HIGH (ref 14–54)
ALT: 69 U/L — ABNORMAL HIGH (ref 14–54)
ANION GAP: 7 (ref 5–15)
ANION GAP: 8 (ref 5–15)
AST: 175 U/L — ABNORMAL HIGH (ref 15–41)
AST: 148 U/L — ABNORMAL HIGH (ref 15–41)
Albumin: 3.7 g/dL (ref 3.5–5.0)
Alkaline Phosphatase: 35 U/L — ABNORMAL LOW (ref 38–126)
Alkaline Phosphatase: 32 U/L — ABNORMAL LOW (ref 38–126)
BILIRUBIN TOTAL: 0.6 mg/dL (ref 0.3–1.2)
BUN: 14 mg/dL (ref 6–20)
BUN: 17 mg/dL (ref 6–20)
CHLORIDE: 107 mmol/L (ref 101–111)
CO2: 25 mmol/L (ref 22–32)
CO2: 27 mmol/L (ref 22–32)
Calcium: 8.7 mg/dL — ABNORMAL LOW (ref 8.9–10.3)
Calcium: 8.4 mg/dL — ABNORMAL LOW (ref 8.9–10.3)
Chloride: 108 mmol/L (ref 101–111)
Creatinine, Ser: 1.33 mg/dL — ABNORMAL HIGH (ref 0.44–1.00)
Creatinine, Ser: 1.29 mg/dL — ABNORMAL HIGH (ref 0.44–1.00)
GFR calc non Af Amer: 53 mL/min — ABNORMAL LOW (ref >60)
GFR, EST AFRICAN AMERICAN: 60 mL/min — AB (ref >60)
GFR, EST NON AFRICAN AMERICAN: 51 mL/min — AB (ref >60)
GLUCOSE: 140 mg/dL — AB (ref 65–99)
Glucose, Bld: 194 mg/dL — ABNORMAL HIGH (ref 65–99)
POTASSIUM: 3.3 mmol/L — AB (ref 3.5–5.1)
POTASSIUM: 3.3 mmol/L — AB (ref 3.5–5.1)
SODIUM: 142 mmol/L (ref 135–145)
Sodium: 140 mmol/L (ref 135–145)
TOTAL PROTEIN: 6.9 g/dL (ref 6.5–8.1)
Total Bilirubin: 0.6 mg/dL (ref 0.3–1.2)
Total Protein: 6.5 g/dL (ref 6.5–8.1)

## 2017-08-30 LAB — URINALYSIS, ROUTINE W REFLEX MICROSCOPIC: Bacteria, UA: NONE SEEN

## 2017-08-30 LAB — RAPID URINE DRUG SCREEN, HOSP PERFORMED
Amphetamines: NOT DETECTED
BARBITURATES: NOT DETECTED
Benzodiazepines: POSITIVE — AB
COCAINE: POSITIVE — AB
Opiates: POSITIVE — AB
TETRAHYDROCANNABINOL: POSITIVE — AB

## 2017-08-30 LAB — CBC
HCT: 35.3 % — ABNORMAL LOW (ref 36.0–46.0)
HEMATOCRIT: 36.4 % (ref 36.0–46.0)
HEMOGLOBIN: 11.5 g/dL — AB (ref 12.0–15.0)
HEMOGLOBIN: 12 g/dL (ref 12.0–15.0)
MCH: 29.2 pg (ref 26.0–34.0)
MCH: 28.5 pg (ref 26.0–34.0)
MCHC: 33 g/dL (ref 30.0–36.0)
MCHC: 32.6 g/dL (ref 30.0–36.0)
MCV: 88.6 fL (ref 78.0–100.0)
MCV: 87.4 fL (ref 78.0–100.0)
PLATELETS: 211 10*3/uL (ref 150–400)
Platelets: 274 10*3/uL (ref 150–400)
RBC: 4.11 MIL/uL (ref 3.87–5.11)
RBC: 4.04 MIL/uL (ref 3.87–5.11)
RDW: 12.9 % (ref 11.5–15.5)
RDW: 12.9 % (ref 11.5–15.5)
WBC: 12.8 10*3/uL — ABNORMAL HIGH (ref 4.0–10.5)
WBC: 17.7 10*3/uL — AB (ref 4.0–10.5)

## 2017-08-30 LAB — PREPARE FRESH FROZEN PLASMA
Unit division: 0
Unit division: 0

## 2017-08-30 LAB — I-STAT CHEM 8, ED
BUN: 14 mg/dL (ref 6–20)
CALCIUM ION: 1.09 mmol/L — AB (ref 1.15–1.40)
CREATININE: 1.1 mg/dL — AB (ref 0.44–1.00)
Chloride: 105 mmol/L (ref 101–111)
GLUCOSE: 158 mg/dL — AB (ref 65–99)
HCT: 36 % (ref 36.0–46.0)
Hemoglobin: 12.2 g/dL (ref 12.0–15.0)
Potassium: 3.2 mmol/L — ABNORMAL LOW (ref 3.5–5.1)
SODIUM: 142 mmol/L (ref 135–145)
TCO2: 21 mmol/L — AB (ref 22–32)

## 2017-08-30 LAB — ETHANOL

## 2017-08-30 LAB — BPAM FFP
Blood Product Expiration Date: 201905102359
Blood Product Expiration Date: 201905102359
ISSUE DATE / TIME: 201905080037
ISSUE DATE / TIME: 201905080037
Unit Type and Rh: 6200
Unit Type and Rh: 6200

## 2017-08-30 LAB — ABO/RH: ABO/RH(D): AB POS

## 2017-08-30 LAB — MRSA PCR SCREENING: MRSA BY PCR: NEGATIVE

## 2017-08-30 LAB — I-STAT CG4 LACTIC ACID, ED: Lactic Acid, Venous: 4.35 mmol/L (ref 0.5–1.9)

## 2017-08-30 LAB — HIV ANTIBODY (ROUTINE TESTING W REFLEX): HIV SCREEN 4TH GENERATION: NONREACTIVE

## 2017-08-30 LAB — CDS SEROLOGY

## 2017-08-30 LAB — BLOOD PRODUCT ORDER (VERBAL) VERIFICATION

## 2017-08-30 SURGERY — IRRIGATION AND DEBRIDEMENT EXTREMITY
Anesthesia: General | Laterality: Left

## 2017-08-30 MED ORDER — MIDAZOLAM HCL 5 MG/5ML IJ SOLN
INTRAMUSCULAR | Status: DC | PRN
  Administered 2017-08-30: 2 mg via INTRAVENOUS

## 2017-08-30 MED ORDER — PROPOFOL 10 MG/ML IV BOLUS
INTRAVENOUS | Status: AC
  Filled 2017-08-30: qty 20

## 2017-08-30 MED ORDER — PHENYLEPHRINE HCL 10 MG/ML IJ SOLN
INTRAMUSCULAR | Status: DC | PRN
  Administered 2017-08-30 (×4): 80 ug via INTRAVENOUS

## 2017-08-30 MED ORDER — FENTANYL CITRATE (PF) 250 MCG/5ML IJ SOLN
INTRAMUSCULAR | Status: AC
  Filled 2017-08-30: qty 5

## 2017-08-30 MED ORDER — ONDANSETRON HCL 4 MG/2ML IJ SOLN
INTRAMUSCULAR | Status: AC
  Filled 2017-08-30: qty 2

## 2017-08-30 MED ORDER — TETANUS-DIPHTH-ACELL PERTUSSIS 5-2.5-18.5 LF-MCG/0.5 IM SUSP
0.5000 mL | Freq: Once | INTRAMUSCULAR | Status: AC
  Administered 2017-08-30: 0.5 mL via INTRAMUSCULAR
  Filled 2017-08-30: qty 0.5

## 2017-08-30 MED ORDER — HYDROMORPHONE HCL 1 MG/ML IJ SOLN
0.5000 mg | INTRAMUSCULAR | Status: DC | PRN
Start: 2017-08-30 — End: 2017-08-30
  Administered 2017-08-30 (×3): 1 mg via INTRAVENOUS
  Administered 2017-08-30: 0.5 mg via INTRAVENOUS
  Filled 2017-08-30 (×3): qty 1

## 2017-08-30 MED ORDER — ONDANSETRON 4 MG PO TBDP
4.0000 mg | ORAL_TABLET | Freq: Four times a day (QID) | ORAL | Status: DC | PRN
  Filled 2017-08-30: qty 1

## 2017-08-30 MED ORDER — HYDROMORPHONE HCL 2 MG/ML IJ SOLN
INTRAMUSCULAR | Status: AC
  Filled 2017-08-30: qty 1

## 2017-08-30 MED ORDER — 0.9 % SODIUM CHLORIDE (POUR BTL) OPTIME
TOPICAL | Status: DC | PRN
Start: 2017-08-30 — End: 2017-08-30
  Administered 2017-08-30 (×2): 1000 mL

## 2017-08-30 MED ORDER — SUGAMMADEX SODIUM 200 MG/2ML IV SOLN
INTRAVENOUS | Status: AC
  Filled 2017-08-30: qty 2

## 2017-08-30 MED ORDER — LIDOCAINE HCL (CARDIAC) PF 100 MG/5ML IV SOSY
PREFILLED_SYRINGE | INTRAVENOUS | Status: DC | PRN
  Administered 2017-08-30: 100 mg via INTRATRACHEAL

## 2017-08-30 MED ORDER — FAMOTIDINE IN NACL 20-0.9 MG/50ML-% IV SOLN
20.0000 mg | Freq: Two times a day (BID) | INTRAVENOUS | Status: DC
  Administered 2017-08-30 – 2017-09-02 (×3): 20 mg via INTRAVENOUS
  Filled 2017-08-30 (×6): qty 50

## 2017-08-30 MED ORDER — VANCOMYCIN HCL 1000 MG IV SOLR
INTRAVENOUS | Status: AC
  Filled 2017-08-30: qty 2000

## 2017-08-30 MED ORDER — HYDROMORPHONE HCL 1 MG/ML IJ SOLN
0.5000 mg | INTRAMUSCULAR | Status: DC | PRN
  Administered 2017-08-30 (×3): 1 mg via INTRAVENOUS
  Filled 2017-08-30 (×3): qty 1

## 2017-08-30 MED ORDER — CEFAZOLIN SODIUM-DEXTROSE 2-4 GM/100ML-% IV SOLN
2.0000 g | Freq: Once | INTRAVENOUS | Status: AC
  Administered 2017-08-30: 2 g via INTRAVENOUS
  Filled 2017-08-30: qty 100

## 2017-08-30 MED ORDER — ONDANSETRON HCL 4 MG/2ML IJ SOLN
INTRAMUSCULAR | Status: AC
  Administered 2017-08-30: 4 mg via INTRAVENOUS
  Filled 2017-08-30: qty 2

## 2017-08-30 MED ORDER — ONDANSETRON HCL 4 MG/2ML IJ SOLN
INTRAMUSCULAR | Status: AC
  Filled 2017-08-30: qty 4

## 2017-08-30 MED ORDER — CHLORHEXIDINE GLUCONATE 0.12 % MT SOLN
15.0000 mL | Freq: Two times a day (BID) | OROMUCOSAL | Status: DC
  Administered 2017-08-30 – 2017-09-08 (×15): 15 mL via OROMUCOSAL
  Filled 2017-08-30 (×15): qty 15

## 2017-08-30 MED ORDER — METHOCARBAMOL 1000 MG/10ML IJ SOLN
500.0000 mg | Freq: Four times a day (QID) | INTRAVENOUS | Status: DC | PRN
  Administered 2017-08-30 – 2017-09-04 (×12): 500 mg via INTRAVENOUS
  Filled 2017-08-30 (×10): qty 5

## 2017-08-30 MED ORDER — HYDROMORPHONE HCL 1 MG/ML IJ SOLN
1.0000 mg | INTRAMUSCULAR | Status: DC | PRN
  Administered 2017-08-30 – 2017-09-02 (×38): 2 mg via INTRAVENOUS
  Filled 2017-08-30 (×38): qty 2

## 2017-08-30 MED ORDER — ROCURONIUM BROMIDE 100 MG/10ML IV SOLN
INTRAVENOUS | Status: DC | PRN
  Administered 2017-08-30 (×2): 10 mg via INTRAVENOUS
  Administered 2017-08-30: 50 mg via INTRAVENOUS
  Administered 2017-08-30: 10 mg via INTRAVENOUS

## 2017-08-30 MED ORDER — TOBRAMYCIN SULFATE 80 MG/2ML IJ SOLN
INTRAMUSCULAR | Status: DC | PRN
  Administered 2017-08-30: 80 mg

## 2017-08-30 MED ORDER — HYDROMORPHONE HCL 2 MG/ML IJ SOLN
0.5000 mg | INTRAMUSCULAR | Status: DC | PRN
  Administered 2017-08-30: 1 mg via INTRAVENOUS
  Filled 2017-08-30: qty 1

## 2017-08-30 MED ORDER — CEFAZOLIN SODIUM-DEXTROSE 2-4 GM/100ML-% IV SOLN
INTRAVENOUS | Status: AC
  Filled 2017-08-30: qty 100

## 2017-08-30 MED ORDER — WHITE PETROLATUM EX OINT
TOPICAL_OINTMENT | CUTANEOUS | Status: AC
  Administered 2017-08-30: 0.2
  Filled 2017-08-30: qty 28.35

## 2017-08-30 MED ORDER — MEPERIDINE HCL 50 MG/ML IJ SOLN: 6.2500 mg | INTRAMUSCULAR | Status: DC | PRN

## 2017-08-30 MED ORDER — SODIUM CHLORIDE 0.9 % IV SOLN
INTRAVENOUS | Status: AC | PRN
  Administered 2017-08-30: 1000 mL via INTRAVENOUS

## 2017-08-30 MED ORDER — FENTANYL CITRATE (PF) 250 MCG/5ML IJ SOLN
INTRAMUSCULAR | Status: DC | PRN
  Administered 2017-08-30: 100 ug via INTRAVENOUS
  Administered 2017-08-30: 50 ug via INTRAVENOUS
  Administered 2017-08-30: 100 ug via INTRAVENOUS
  Administered 2017-08-30: 150 ug via INTRAVENOUS
  Administered 2017-08-30: 100 ug via INTRAVENOUS

## 2017-08-30 MED ORDER — FENTANYL CITRATE (PF) 100 MCG/2ML IJ SOLN
25.0000 ug | INTRAMUSCULAR | Status: DC | PRN
  Administered 2017-08-30: 50 ug via INTRAVENOUS

## 2017-08-30 MED ORDER — IOPAMIDOL (ISOVUE-370) INJECTION 76%
100.0000 mL | Freq: Once | INTRAVENOUS | Status: AC | PRN
  Administered 2017-08-30: 100 mL via INTRAVENOUS

## 2017-08-30 MED ORDER — PANTOPRAZOLE SODIUM 40 MG PO TBEC
40.0000 mg | DELAYED_RELEASE_TABLET | Freq: Two times a day (BID) | ORAL | Status: DC
  Administered 2017-08-31 – 2017-09-08 (×14): 40 mg via ORAL
  Filled 2017-08-30 (×16): qty 1

## 2017-08-30 MED ORDER — VANCOMYCIN HCL 1000 MG IV SOLR
INTRAVENOUS | Status: AC
  Filled 2017-08-30: qty 1000

## 2017-08-30 MED ORDER — LIDOCAINE-EPINEPHRINE (PF) 2 %-1:200000 IJ SOLN
20.0000 mL | Freq: Once | INTRAMUSCULAR | Status: AC
  Administered 2017-08-30: 20 mL

## 2017-08-30 MED ORDER — SUGAMMADEX SODIUM 200 MG/2ML IV SOLN
INTRAVENOUS | Status: DC | PRN
  Administered 2017-08-30: 200 mg via INTRAVENOUS

## 2017-08-30 MED ORDER — DEXAMETHASONE SODIUM PHOSPHATE 10 MG/ML IJ SOLN
INTRAMUSCULAR | Status: DC | PRN
  Administered 2017-08-30: 10 mg via INTRAVENOUS

## 2017-08-30 MED ORDER — PHENYLEPHRINE 40 MCG/ML (10ML) SYRINGE FOR IV PUSH (FOR BLOOD PRESSURE SUPPORT)
PREFILLED_SYRINGE | INTRAVENOUS | Status: AC
  Filled 2017-08-30: qty 10

## 2017-08-30 MED ORDER — DEXMEDETOMIDINE HCL IN NACL 200 MCG/50ML IV SOLN
INTRAVENOUS | Status: AC
  Filled 2017-08-30: qty 50

## 2017-08-30 MED ORDER — SUCCINYLCHOLINE CHLORIDE 200 MG/10ML IV SOSY
PREFILLED_SYRINGE | INTRAVENOUS | Status: AC
  Filled 2017-08-30: qty 10

## 2017-08-30 MED ORDER — ONDANSETRON HCL 4 MG/2ML IJ SOLN
4.0000 mg | Freq: Four times a day (QID) | INTRAMUSCULAR | Status: DC | PRN
  Administered 2017-08-30 (×3): 4 mg via INTRAVENOUS
  Filled 2017-08-30: qty 2

## 2017-08-30 MED ORDER — ALBUMIN HUMAN 5 % IV SOLN
INTRAVENOUS | Status: DC | PRN
  Administered 2017-08-30: 11:00:00 via INTRAVENOUS

## 2017-08-30 MED ORDER — SUCCINYLCHOLINE CHLORIDE 20 MG/ML IJ SOLN
INTRAMUSCULAR | Status: DC | PRN
  Administered 2017-08-30: 100 mg via INTRAVENOUS

## 2017-08-30 MED ORDER — HYDROMORPHONE HCL 2 MG/ML IJ SOLN
1.0000 mg | Freq: Once | INTRAMUSCULAR | Status: AC
  Administered 2017-08-30: 1 mg via INTRAVENOUS

## 2017-08-30 MED ORDER — HYDROMORPHONE HCL 1 MG/ML IJ SOLN
INTRAMUSCULAR | Status: AC
  Filled 2017-08-30: qty 0.5

## 2017-08-30 MED ORDER — TOBRAMYCIN POWD
Status: DC | PRN
  Administered 2017-08-30 (×2): 1200 mg via TOPICAL

## 2017-08-30 MED ORDER — METOCLOPRAMIDE HCL 5 MG/ML IJ SOLN: 10.0000 mg | Freq: Once | INTRAMUSCULAR | Status: DC | PRN

## 2017-08-30 MED ORDER — MIDAZOLAM HCL 2 MG/2ML IJ SOLN
INTRAMUSCULAR | Status: AC
  Filled 2017-08-30: qty 2

## 2017-08-30 MED ORDER — CEFAZOLIN SODIUM-DEXTROSE 2-3 GM-%(50ML) IV SOLR
INTRAVENOUS | Status: DC | PRN
  Administered 2017-08-30: 2 g via INTRAVENOUS

## 2017-08-30 MED ORDER — SODIUM CHLORIDE 0.9 % IV SOLN
INTRAVENOUS | Status: DC
  Administered 2017-08-31: 15:00:00 via INTRAVENOUS

## 2017-08-30 MED ORDER — FENTANYL CITRATE (PF) 100 MCG/2ML IJ SOLN
INTRAMUSCULAR | Status: AC
  Filled 2017-08-30: qty 2

## 2017-08-30 MED ORDER — SODIUM CHLORIDE 0.9 % IV SOLN
2.0000 g | INTRAVENOUS | Status: AC
  Administered 2017-08-30 – 2017-09-02 (×4): 2 g via INTRAVENOUS
  Filled 2017-08-30 (×5): qty 20

## 2017-08-30 MED ORDER — LACTATED RINGERS IV SOLN
INTRAVENOUS | Status: DC | PRN
  Administered 2017-08-30 (×3): via INTRAVENOUS

## 2017-08-30 MED ORDER — ONDANSETRON HCL 4 MG/2ML IJ SOLN
INTRAMUSCULAR | Status: DC | PRN
  Administered 2017-08-30: 4 mg via INTRAVENOUS

## 2017-08-30 MED ORDER — VANCOMYCIN HCL 1000 MG IV SOLR
INTRAVENOUS | Status: DC | PRN
  Administered 2017-08-30 (×3): 1000 mg via TOPICAL

## 2017-08-30 MED ORDER — LIDOCAINE 2% (20 MG/ML) 5 ML SYRINGE
INTRAMUSCULAR | Status: AC
  Filled 2017-08-30: qty 15

## 2017-08-30 MED ORDER — LACTATED RINGERS IV SOLN: INTRAVENOUS | Status: DC

## 2017-08-30 MED ORDER — SODIUM CHLORIDE 0.9 % IR SOLN
Status: DC | PRN
  Administered 2017-08-30 (×6): 3000 mL

## 2017-08-30 MED ORDER — ORAL CARE MOUTH RINSE
15.0000 mL | Freq: Two times a day (BID) | OROMUCOSAL | Status: DC
  Administered 2017-08-31 – 2017-09-08 (×7): 15 mL via OROMUCOSAL

## 2017-08-30 MED ORDER — MORPHINE SULFATE 2 MG/ML IJ SOLN
INTRAMUSCULAR | Status: AC | PRN
  Administered 2017-08-30: 4 mg via INTRAVENOUS

## 2017-08-30 MED ORDER — ROCURONIUM BROMIDE 50 MG/5ML IV SOLN
INTRAVENOUS | Status: AC
  Filled 2017-08-30: qty 3

## 2017-08-30 MED ORDER — IOHEXOL 300 MG/ML  SOLN
100.0000 mL | Freq: Once | INTRAMUSCULAR | Status: AC | PRN
  Administered 2017-08-30: 50 mL via INTRAVENOUS

## 2017-08-30 MED ORDER — MORPHINE SULFATE (PF) 4 MG/ML IV SOLN
INTRAVENOUS | Status: AC
  Filled 2017-08-30: qty 1

## 2017-08-30 MED ORDER — PROPOFOL 10 MG/ML IV BOLUS
INTRAVENOUS | Status: DC | PRN
  Administered 2017-08-30: 130 mg via INTRAVENOUS

## 2017-08-30 MED ORDER — TOBRAMYCIN SULFATE 1.2 G IJ SOLR
INTRAMUSCULAR | Status: AC
  Filled 2017-08-30: qty 2.4

## 2017-08-30 MED ORDER — TOBRAMYCIN SULFATE 80 MG/2ML IJ SOLN
INTRAMUSCULAR | Status: AC
  Filled 2017-08-30: qty 2

## 2017-08-30 SURGICAL SUPPLY — 96 items
BANDAGE ACE 4X5 VEL STRL LF (GAUZE/BANDAGES/DRESSINGS) IMPLANT
BANDAGE ACE 6X5 VEL STRL LF (GAUZE/BANDAGES/DRESSINGS) IMPLANT
BANDAGE ELASTIC 6 VELCRO ST LF (GAUZE/BANDAGES/DRESSINGS) ×3 IMPLANT
BIT DRILL CAL 3.2 LONG (BIT) ×2 IMPLANT
BIT DRILL CAL 3.2MM LONG (BIT) ×1
BIT DRILL SHORT 3.2MM (DRILL) ×2 IMPLANT
BIT DRILL SHORT 4.2 (BIT) ×2 IMPLANT
BLADE SURG 10 STRL SS (BLADE) IMPLANT
BNDG COHESIVE 4X5 TAN STRL (GAUZE/BANDAGES/DRESSINGS) ×6 IMPLANT
BNDG COHESIVE 6X5 TAN STRL LF (GAUZE/BANDAGES/DRESSINGS) IMPLANT
BNDG GAUZE ELAST 4 BULKY (GAUZE/BANDAGES/DRESSINGS) IMPLANT
BOWL SMART MIX CTS (DISPOSABLE) ×3 IMPLANT
BRUSH SCRUB SURG 4.25 DISP (MISCELLANEOUS) ×6 IMPLANT
CEMENT BONE R 1X40 (Cement) ×3 IMPLANT
CHLORAPREP W/TINT 26ML (MISCELLANEOUS) IMPLANT
COVER MAYO STAND STRL (DRAPES) IMPLANT
COVER SURGICAL LIGHT HANDLE (MISCELLANEOUS) ×3 IMPLANT
DRAPE C-ARM 35X43 STRL (DRAPES) ×3 IMPLANT
DRAPE C-ARM 42X72 X-RAY (DRAPES) IMPLANT
DRAPE C-ARMOR (DRAPES) ×3 IMPLANT
DRAPE HALF SHEET 40X57 (DRAPES) ×6 IMPLANT
DRAPE IMP U-DRAPE 54X76 (DRAPES) ×9 IMPLANT
DRAPE INCISE IOBAN 66X45 STRL (DRAPES) ×3 IMPLANT
DRAPE ORTHO SPLIT 77X108 STRL (DRAPES) ×4
DRAPE SURG 17X23 STRL (DRAPES) ×3 IMPLANT
DRAPE SURG ORHT 6 SPLT 77X108 (DRAPES) ×2 IMPLANT
DRAPE U-SHAPE 47X51 STRL (DRAPES) ×3 IMPLANT
DRILL BIT SHORT 4.2 (BIT) ×4
DRILL SHORT 3.2MM (DRILL) ×6
DRSG ADAPTIC 3X8 NADH LF (GAUZE/BANDAGES/DRESSINGS) IMPLANT
DRSG MEPILEX BORDER 4X4 (GAUZE/BANDAGES/DRESSINGS) ×15 IMPLANT
DRSG MEPILEX BORDER 4X8 (GAUZE/BANDAGES/DRESSINGS) IMPLANT
DRSG MEPITEL 4X7.2 (GAUZE/BANDAGES/DRESSINGS) ×3 IMPLANT
DRSG VAC ATS LRG SENSATRAC (GAUZE/BANDAGES/DRESSINGS) ×3 IMPLANT
ELECT REM PT RETURN 9FT ADLT (ELECTROSURGICAL) ×3
ELECTRODE REM PT RTRN 9FT ADLT (ELECTROSURGICAL) ×1 IMPLANT
EVACUATOR 1/8 PVC DRAIN (DRAIN) IMPLANT
FLUID NSS /IRRIG 3000 ML XXX (IV SOLUTION) ×12 IMPLANT
GAUZE SPONGE 4X4 12PLY STRL (GAUZE/BANDAGES/DRESSINGS) IMPLANT
GLOVE BIO SURGEON STRL SZ7.5 (GLOVE) ×21 IMPLANT
GLOVE BIOGEL PI IND STRL 7.5 (GLOVE) ×4 IMPLANT
GLOVE BIOGEL PI INDICATOR 7.5 (GLOVE) ×8
GOWN STRL REUS W/ TWL LRG LVL3 (GOWN DISPOSABLE) ×7 IMPLANT
GOWN STRL REUS W/TWL LRG LVL3 (GOWN DISPOSABLE) ×14
GOWN STRL REUS W/TWL XL LVL3 (GOWN DISPOSABLE) ×3 IMPLANT
GUIDEWIRE 3.2X400 (WIRE) ×12 IMPLANT
HANDPIECE INTERPULSE COAX TIP (DISPOSABLE) ×2
KIT BASIN OR (CUSTOM PROCEDURE TRAY) ×3 IMPLANT
KIT TURNOVER KIT B (KITS) ×3 IMPLANT
MANIFOLD NEPTUNE II (INSTRUMENTS) ×3 IMPLANT
NAIL 10 TI CANN FRN PF 380 LT (Nail) ×3 IMPLANT
NAIL TIB 8X360 (Nail) ×3 IMPLANT
NS IRRIG 1000ML POUR BTL (IV SOLUTION) ×6 IMPLANT
PACK GENERAL/GYN (CUSTOM PROCEDURE TRAY) IMPLANT
PACK ORTHO EXTREMITY (CUSTOM PROCEDURE TRAY) IMPLANT
PACK TOTAL JOINT (CUSTOM PROCEDURE TRAY) IMPLANT
PAD ARMBOARD 7.5X6 YLW CONV (MISCELLANEOUS) ×6 IMPLANT
PADDING CAST COTTON 6X4 STRL (CAST SUPPLIES) IMPLANT
REAMER ROD DEEP FLUTE 2.5X950 (INSTRUMENTS) ×3 IMPLANT
ROD REAMER STRT BALL 3.0X950 (MISCELLANEOUS) ×3 IMPLANT
SCREW LOCK 4.0X42MM (Screw) ×3 IMPLANT
SCREW LOCK T25 FT 34X4X3.3X (Screw) ×1 IMPLANT
SCREW LOCKING 4.0X28MM (Screw) ×3 IMPLANT
SCREW LOCKING 4.0X34MM (Screw) ×2 IMPLANT
SCREW LOCKING 4.0X36MM (Screw) ×3 IMPLANT
SCREW LOCKING 4.0X60MM (Screw) ×3 IMPLANT
SCREW LOCKING 5.0MMX40MM (Screw) ×3 IMPLANT
SCREW LOCKING 5.0X46MM (Screw) ×3 IMPLANT
SCREW RECON 6.5 X 85MM (Screw) ×3 IMPLANT
SCREW RECON W/T25 STRD 90MM (Screw) ×3 IMPLANT
SET HNDPC FAN SPRY TIP SCT (DISPOSABLE) ×1 IMPLANT
SPONGE LAP 18X18 X RAY DECT (DISPOSABLE) IMPLANT
STAPLER VISISTAT 35W (STAPLE) ×3 IMPLANT
STOCKINETTE IMPERVIOUS LG (DRAPES) ×3 IMPLANT
SUT ETHILON 2 0 FS 18 (SUTURE) ×6 IMPLANT
SUT ETHILON 2 0 PSLX (SUTURE) ×9 IMPLANT
SUT ETHILON 3 0 PS 1 (SUTURE) ×3 IMPLANT
SUT MNCRL AB 3-0 PS2 18 (SUTURE) IMPLANT
SUT MON AB 2-0 CT1 36 (SUTURE) ×9 IMPLANT
SUT PDS AB 0 CT 36 (SUTURE) ×6 IMPLANT
SUT VIC AB 0 CT1 27 (SUTURE)
SUT VIC AB 0 CT1 27XBRD ANBCTR (SUTURE) IMPLANT
SUT VIC AB 2-0 CT1 27 (SUTURE)
SUT VIC AB 2-0 CT1 TAPERPNT 27 (SUTURE) IMPLANT
SWAB CULTURE ESWAB REG 1ML (MISCELLANEOUS) IMPLANT
SYR BULB IRRIGATION 50ML (SYRINGE) ×3 IMPLANT
TOWEL OR 17X24 6PK STRL BLUE (TOWEL DISPOSABLE) ×6 IMPLANT
TOWEL OR 17X26 10 PK STRL BLUE (TOWEL DISPOSABLE) ×6 IMPLANT
TUBE CONNECTING 12'X1/4 (SUCTIONS) ×1
TUBE CONNECTING 12X1/4 (SUCTIONS) ×2 IMPLANT
TUBE CONNECTING 20'X1/4 (TUBING) ×1
TUBE CONNECTING 20X1/4 (TUBING) ×2 IMPLANT
UNDERPAD 30X30 (UNDERPADS AND DIAPERS) ×3 IMPLANT
WATER STERILE IRR 1000ML POUR (IV SOLUTION) IMPLANT
WND VAC CANISTER 500ML (MISCELLANEOUS) ×6 IMPLANT
YANKAUER SUCT BULB TIP NO VENT (SUCTIONS) ×3 IMPLANT
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
## 2017-08-30 NOTE — Anesthesia Preprocedure Evaluation (Addendum)
Anesthesia Evaluation  Patient identified by MRN, date of birth, ID band Patient unresponsive    Reviewed: Allergy & Precautions, NPO status , Patient's Chart, lab work & pertinent test results  Airway Mallampati: II  TM Distance: >3 FB Neck ROM: Full    Dental no notable dental hx.    Pulmonary Current Smoker,    Pulmonary exam normal breath sounds clear to auscultation       Cardiovascular negative cardio ROS Normal cardiovascular exam Rhythm:Regular Rate:Normal     Neuro/Psych Seizures -, Well Controlled,  negative neurological ROS  negative psych ROS   GI/Hepatic negative GI ROS, (+)     substance abuse (opioids. on Suboxone. )  ,   Endo/Other  negative endocrine ROS  Renal/GU negative Renal ROS  negative genitourinary   Musculoskeletal negative musculoskeletal ROS (+)   Abdominal   Peds negative pediatric ROS (+)  Hematology negative hematology ROS (+)   Anesthesia Other Findings   Reproductive/Obstetrics negative OB ROS                            Anesthesia Physical Anesthesia Plan  ASA: II  Anesthesia Plan: General   Post-op Pain Management:    Induction:   PONV Risk Score and Plan: 2 and Ondansetron, Dexamethasone and Treatment may vary due to age or medical condition  Airway Management Planned: Oral ETT and Video Laryngoscope Planned  Additional Equipment:   Intra-op Plan:   Post-operative Plan:   Informed Consent: I have reviewed the patients History and Physical, chart, labs and discussed the procedure including the risks, benefits and alternatives for the proposed anesthesia with the patient or authorized representative who has indicated his/her understanding and acceptance.   Dental advisory given  Plan Discussed with:   Anesthesia Plan Comments:         Anesthesia Quick Evaluation

## 2017-08-30 NOTE — ED Notes (Signed)
Paged Urology
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
## 2017-08-30 NOTE — Procedures (Addendum)
Preoperative Dx: multiple facial/forhead/ear lacerations.  Postoperative Dx: s/p laceration repairs.  Procedures: 1. Repair of complex laceration of upper lip/lateral nose - 5cm 2. Repair of 3cm left forehead laceration 3. Repair of 3cm left ear laceraion  Surgeon: Junita Push, DMD  Procedure: The patient was anesthetized with 8cc of 2% lidocaine with 1:200,000 epi as infraorbital nerve block on the left side as well as local infiltration of the upper lip left forehead and superior and posterior auricular areas. The wounds were thoroughly irrigated with saline under pressure tip and debrided.  Attention was directed to the upper lip and lateral nose laceration, where multiple 4-0 Vicryl sutures were utilized to reapproximate the orbicularis oris muscle.  Additional 4-0 Vicryl sutures were utilized to close the submucosal layer.  The wound margins were freshened, and the skin was closed primarily with multiple 5-0 Prolene sutures.  Next the left 3 cm forehead wound was reapproximated with multiple 4-0 Vicryl sutures placed deep in the frontalis muscle as well as the submucosal areas.  The skin was then closed primarily with multiple 5-0 Prolene sutures.  The left superior/posterior base of the ear laceration was reapproximated with a 4-0 Vicryl suture placed deep, then the skin was closed primarily with multiple 4-0 chromic gut sutures.  Following repairs, the wounds were cleansed again and a thin coat of Bacitracin ointment was applied. The patient tolerated the procedure well.  Complications: None  EBL: <5cc  Disposition:   Recommendations: 1. Antibiotic coverage with Keflex  po qid x 7 days. 2. Clean extraoral wounds using a 50-50 mixture of hydrogen peroxide and water with a q-tip applicator three times daily, then coat with Bacitracin ointment three times daily. 4. Patient to contact [facility] at [phone] to set up follow up appointment in approximately 1  week.  Junita Push, DMD Oral and Maxillofacial Surgery
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
## 2017-08-30 NOTE — ED Notes (Signed)
EMS c-collar changed into aspen collar per Dr. Corliss Skains; c-spine alignment maintained

## 2017-08-30 NOTE — Progress Notes (Signed)
Pt is in obvious pain, open deformities and fractures to left extremity. Airway is patent.

## 2017-08-30 NOTE — ED Notes (Signed)
Dr Shelton Silvas at bedside suturing wounds on face
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
## 2017-08-30 NOTE — Progress Notes (Signed)
Patient went to OR with Dr Jena Gauss today. Urine is draining well and clearing up. Good UOP.   abd soft NTND Foley draining yellow urine  -spoke with Dr Jena Gauss. Plans to possibly take back to OR to place screw in pubic symphysis tomorrow but does not plan to make incision into retropubic space. Therefore will plan to treat conservatively with foley catheter drainage with cystogram in 14 days prior to considering removal.

## 2017-08-30 NOTE — Transfer of Care (Signed)
Immediate Anesthesia Transfer of Care Note  Patient: [name]  Procedure(s) Performed: IRRIGATION AND DEBRIDEMENT LEFT FEMUR AND TIBIA (Left ) INTRAMEDULLARY (IM) NAIL FEMORAL (Left ) INTRAMEDULLARY (IM) NAIL TIBIAL (Left )  Patient Location: PACU  Anesthesia Type:General  Level of Consciousness: awake, alert  and oriented  Airway & Oxygen Therapy: Patient Spontanous Breathing and Patient connected to face mask oxygen  Post-op Assessment: Report given to RN and Post -op Vital signs reviewed and stable  Post vital signs: Reviewed and stable  Last Vitals:  Vitals Value Taken Time  BP 182/165 08/30/2017 12:26 PM  Temp    Pulse 106 08/30/2017 12:28 PM  Resp 15 08/30/2017 12:28 PM  SpO2 100 % 08/30/2017 12:28 PM  Vitals shown include unvalidated device data.  Last Pain:  Vitals:   08/30/17 0600  TempSrc:   PainSc: 10-Worst pain ever         Complications: No apparent anesthesia complications

## 2017-08-30 NOTE — Progress Notes (Addendum)
Inpatient Diabetes Program Recommendations  AACE/ADA: New Consensus Statement on Inpatient Glycemic Control (2015)  Target Ranges:  Prepandial:   less than 140 mg/dL      Peak postprandial:   less than 180 mg/dL (1-2 hours)      Critically ill patients:  140 - 180 mg/dL     Review of Glycemic Control Results for AMELYA, MABRY (MRN [id number]) as of 08/30/2017 11:29  Ref. Range 08/30/2017 00:55 08/30/2017 04:26  Glucose Latest Ref Range: 65 - 99 mg/dL 782 (H) 956 (H)   Diabetes history: none Outpatient Diabetes medications: none Current orders for Inpatient glycemic control: none  Inpatient Diabetes Program Recommendations:     Consider adding Novolog 0-9 units TID + HS. Anticipate BS to be elevated given Decadron 10 mg X 1.  A1C?   Thanks, Lujean Rave, MSN, RNC-OB Diabetes Coordinator [phone] (8a-5p)

## 2017-08-30 NOTE — Anesthesia Procedure Notes (Addendum)
Procedure Name: Intubation Date/Time: 08/30/2017 7:36 AM Performed by: Marena Chancy, CRNA Pre-anesthesia Checklist: Patient identified, Emergency Drugs available, Suction available and Patient being monitored Patient Re-evaluated:Patient Re-evaluated prior to induction Oxygen Delivery Method: Circle System Utilized Preoxygenation: Pre-oxygenation with 100% oxygen Induction Type: IV induction, Cricoid Pressure applied and Rapid sequence Laryngoscope Size: Glidescope and 3 Grade View: Grade I Tube type: Oral Tube size: 7.5 mm Number of attempts: 1 Airway Equipment and Method: Stylet and Oral airway Placement Confirmation: ETT inserted through vocal cords under direct vision,  positive ETCO2 and breath sounds checked- equal and bilateral Tube secured with: Tape Dental Injury: Teeth and Oropharynx as per pre-operative assessment
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
## 2017-08-31 ENCOUNTER — Encounter (HOSPITAL_COMMUNITY): Admission: EM | Disposition: A | Discharge status: Rehabilitation Facility | Payer: Self-pay | Source: Home / Self Care

## 2017-08-31 ENCOUNTER — Inpatient Hospital Stay (HOSPITAL_COMMUNITY): Payer: Medicaid Other | Admitting: Certified Registered"

## 2017-08-31 ENCOUNTER — Other Ambulatory Visit: Payer: Self-pay

## 2017-08-31 ENCOUNTER — Encounter (HOSPITAL_COMMUNITY): Payer: Self-pay

## 2017-08-31 ENCOUNTER — Inpatient Hospital Stay (HOSPITAL_COMMUNITY): Payer: Medicaid Other

## 2017-08-31 DIAGNOSIS — F1111 Opioid abuse, in remission: Secondary | ICD-10-CM

## 2017-08-31 LAB — CBC
HCT: 18.3 % — ABNORMAL LOW (ref 36.0–46.0)
Hemoglobin: 6.1 g/dL — CL (ref 12.0–15.0)
MCH: 29 pg (ref 26.0–34.0)
MCHC: 33.3 g/dL (ref 30.0–36.0)
MCV: 87.1 fL (ref 78.0–100.0)
PLATELETS: 123 10*3/uL — AB (ref 150–400)
RBC: 2.1 MIL/uL — AB (ref 3.87–5.11)
RDW: 13.2 % (ref 11.5–15.5)
WBC: 7.4 10*3/uL (ref 4.0–10.5)

## 2017-08-31 LAB — BASIC METABOLIC PANEL
ANION GAP: 5 (ref 5–15)
BUN: 8 mg/dL (ref 6–20)
CHLORIDE: 106 mmol/L (ref 101–111)
CO2: 30 mmol/L (ref 22–32)
Calcium: 7.2 mg/dL — ABNORMAL LOW (ref 8.9–10.3)
Creatinine, Ser: 0.68 mg/dL (ref 0.44–1.00)
GFR calc non Af Amer: >60 mL/min (ref >60)
GLUCOSE: 100 mg/dL — AB (ref 65–99)
POTASSIUM: 3.5 mmol/L (ref 3.5–5.1)
Sodium: 141 mmol/L (ref 135–145)

## 2017-08-31 LAB — SURGICAL PCR SCREEN
MRSA, PCR: NEGATIVE
Staphylococcus aureus: POSITIVE — AB

## 2017-08-31 LAB — HEMOGLOBIN AND HEMATOCRIT, BLOOD
HCT: 26.5 % — ABNORMAL LOW (ref 36.0–46.0)
HEMOGLOBIN: 8.7 g/dL — AB (ref 12.0–15.0)

## 2017-08-31 LAB — CREATININE, SERUM
CREATININE: 0.74 mg/dL (ref 0.44–1.00)
GFR calc non Af Amer: >60 mL/min (ref >60)

## 2017-08-31 LAB — MAGNESIUM: Magnesium: 1.9 mg/dL (ref 1.7–2.4)

## 2017-08-31 LAB — PREPARE RBC (CROSSMATCH)

## 2017-08-31 SURGERY — OPEN REDUCTION INTERNAL FIXATION (ORIF) CLAVICULAR FRACTURE
Anesthesia: General

## 2017-08-31 MED ORDER — CEFAZOLIN SODIUM 1 G IJ SOLR
INTRAMUSCULAR | Status: AC
  Filled 2017-08-31: qty 20

## 2017-08-31 MED ORDER — MIDAZOLAM HCL 2 MG/2ML IJ SOLN
2.0000 mg | Freq: Once | INTRAMUSCULAR | Status: AC
  Administered 2017-08-31: 2 mg via INTRAVENOUS

## 2017-08-31 MED ORDER — KETOROLAC TROMETHAMINE 30 MG/ML IJ SOLN
30.0000 mg | Freq: Four times a day (QID) | INTRAMUSCULAR | Status: AC
  Administered 2017-09-01 – 2017-09-04 (×11): 30 mg via INTRAVENOUS
  Filled 2017-08-31 (×10): qty 1

## 2017-08-31 MED ORDER — OXYCODONE HCL 5 MG PO TABS
10.0000 mg | ORAL_TABLET | ORAL | Status: DC | PRN
  Administered 2017-08-31 – 2017-09-05 (×19): 10 mg via ORAL
  Filled 2017-08-31 (×20): qty 2

## 2017-08-31 MED ORDER — FENTANYL CITRATE (PF) 100 MCG/2ML IJ SOLN
INTRAMUSCULAR | Status: AC
  Administered 2017-08-31: 100 ug via INTRAVENOUS
  Filled 2017-08-31: qty 2

## 2017-08-31 MED ORDER — KETOROLAC TROMETHAMINE 30 MG/ML IJ SOLN: 30.0000 mg | Freq: Four times a day (QID) | INTRAMUSCULAR | Status: DC

## 2017-08-31 MED ORDER — FENTANYL CITRATE (PF) 100 MCG/2ML IJ SOLN
100.0000 ug | Freq: Once | INTRAMUSCULAR | Status: AC
  Administered 2017-08-31: 100 ug via INTRAVENOUS

## 2017-08-31 MED ORDER — LACTATED RINGERS IV SOLN: INTRAVENOUS | Status: DC

## 2017-08-31 MED ORDER — ROCURONIUM BROMIDE 50 MG/5ML IV SOLN
INTRAVENOUS | Status: AC
  Filled 2017-08-31: qty 1

## 2017-08-31 MED ORDER — LACTATED RINGERS IV SOLN
INTRAVENOUS | Status: DC
  Administered 2017-08-31 – 2017-09-01 (×4): via INTRAVENOUS

## 2017-08-31 MED ORDER — LORAZEPAM 1 MG PO TABS
1.0000 mg | ORAL_TABLET | Freq: Four times a day (QID) | ORAL | Status: DC | PRN
  Administered 2017-08-31 – 2017-09-08 (×19): 1 mg via ORAL
  Filled 2017-08-31 (×19): qty 1

## 2017-08-31 MED ORDER — FENTANYL CITRATE (PF) 250 MCG/5ML IJ SOLN
INTRAMUSCULAR | Status: AC
  Filled 2017-08-31: qty 5

## 2017-08-31 MED ORDER — ENOXAPARIN SODIUM 40 MG/0.4ML ~~LOC~~ SOLN
40.0000 mg | SUBCUTANEOUS | Status: DC
  Administered 2017-08-31 – 2017-09-08 (×8): 40 mg via SUBCUTANEOUS
  Filled 2017-08-31 (×9): qty 0.4

## 2017-08-31 MED ORDER — MIDAZOLAM HCL 2 MG/2ML IJ SOLN
INTRAMUSCULAR | Status: AC
  Administered 2017-08-31: 2 mg via INTRAVENOUS
  Filled 2017-08-31: qty 2

## 2017-08-31 MED ORDER — MIDAZOLAM HCL 2 MG/2ML IJ SOLN
INTRAMUSCULAR | Status: AC
  Filled 2017-08-31: qty 2

## 2017-08-31 MED ORDER — PROPOFOL 10 MG/ML IV BOLUS
INTRAVENOUS | Status: AC
  Filled 2017-08-31: qty 20

## 2017-08-31 MED FILL — Tobramycin Sulfate For Inj 1.2 GM: INTRAMUSCULAR | Qty: 1.2 | Status: AC

## 2017-08-31 NOTE — Progress Notes (Signed)
Wrist x-ray impression results read to Ortho MD. Order for sugar-tong splint ordered. Ortho tech notified. MD will meet and discuss results with patient in the am. Jovi Zavadil, [place] Scrape, RN

## 2017-08-31 NOTE — Progress Notes (Signed)
Patient seen in preoperative holding area. Hemoglobin that returned as 6.1. While surgical fixation of clavicle and pelvis will need to be performed it is not emergent. Will allow patient to be transfused and delay surgery until tomorrow. Please repeat CBC early tomorrow AM, prior to surgery.  Roby Lofts, MD [facility] [phone] (phone)

## 2017-08-31 NOTE — Progress Notes (Signed)
Orthopedic Tech Progress Note Patient Details:  [name] 06-Jun-1980 [id number]  Ortho Devices Type of Ortho Device: Sugartong splint, Arm sling, Ace wrap Ortho Device/Splint Interventions: Application   Post Interventions Patient Tolerated: Well Instructions Provided: Care of device   Saul Fordyce 08/31/2017, 6:34 PM
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
## 2017-08-31 NOTE — Progress Notes (Signed)
CRITICAL VALUE ALERT  Critical Value:  Hemoglobin 6.1  Date & Time Notied:  08/31/2017 at 1211  Provider Notified: Dr. Tacy Dura   Orders Received/Actions taken: verbal order to prepare and transfuse 2 units PRBC
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
## 2017-08-31 NOTE — Anesthesia Postprocedure Evaluation (Signed)
Anesthesia Post Note  Patient: [name]  Procedure(s) Performed: IRRIGATION AND DEBRIDEMENT LEFT FEMUR AND TIBIA (Left ) INTRAMEDULLARY (IM) NAIL FEMORAL (Left ) INTRAMEDULLARY (IM) NAIL TIBIAL (Left )     Patient location during evaluation: PACU Anesthesia Type: General Level of consciousness: awake and alert Pain management: pain level controlled Vital Signs Assessment: post-procedure vital signs reviewed and stable Respiratory status: spontaneous breathing, nonlabored ventilation, respiratory function stable and patient connected to nasal cannula oxygen Cardiovascular status: blood pressure returned to baseline and stable Postop Assessment: no apparent nausea or vomiting Anesthetic complications: no    Last Vitals:  Vitals:   08/31/17 1700 08/31/17 1711  BP: 131/84 126/81  Pulse: (!) 110 (!) 112  Resp: 14 14  Temp:  37.9 C  SpO2: 100% 96%    Last Pain:  Vitals:   08/31/17 1722  TempSrc:   PainSc: 6    Pain Goal: Patients Stated Pain Goal: 4 (08/30/17 2004)               Cecile Hearing

## 2017-08-31 NOTE — Anesthesia Preprocedure Evaluation (Signed)
Anesthesia Evaluation  Patient identified by MRN, date of birth, ID band Patient unresponsive    Reviewed: Allergy & Precautions, NPO status , Patient's Chart, lab work & pertinent test results  Airway        Dental   Pulmonary Current Smoker,           Cardiovascular negative cardio ROS       Neuro/Psych Seizures -, Well Controlled,  negative neurological ROS  negative psych ROS   GI/Hepatic negative GI ROS, (+)     substance abuse (opioids. on Suboxone. )  ,   Endo/Other  negative endocrine ROS  Renal/GU negative Renal ROS  negative genitourinary   Musculoskeletal negative musculoskeletal ROS (+)   Abdominal   Peds negative pediatric ROS (+)  Hematology negative hematology ROS (+)   Anesthesia Other Findings   Reproductive/Obstetrics negative OB ROS                             Anesthesia Physical  Anesthesia Plan  ASA: II  Anesthesia Plan: General   Post-op Pain Management:    Induction:   PONV Risk Score and Plan: 2 and Ondansetron, Dexamethasone and Treatment may vary due to age or medical condition  Airway Management Planned: Oral ETT and LMA  Additional Equipment:   Intra-op Plan:   Post-operative Plan:   Informed Consent: I have reviewed the patients History and Physical, chart, labs and discussed the procedure including the risks, benefits and alternatives for the proposed anesthesia with the patient or authorized representative who has indicated his/her understanding and acceptance.   Dental advisory given  Plan Discussed with: Anesthesiologist, Surgeon and CRNA  Anesthesia Plan Comments:         Anesthesia Quick Evaluation
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
## 2017-08-31 NOTE — Anesthesia Procedure Notes (Signed)
Central Venous Catheter Insertion Performed by: Bethena Midget, MD, anesthesiologist Start/End5/12/2017 1:30 PM, 08/31/2017 1:40 PM Patient location: Pre-op. Preanesthetic checklist: patient identified, IV checked, site marked, risks and benefits discussed, surgical consent, monitors and equipment checked, pre-op evaluation, timeout performed and anesthesia consent Position: Trendelenburg Lidocaine 1% used for infiltration and patient sedated Hand hygiene performed  and maximum sterile barriers used  Catheter size: 8 Fr Total catheter length 16. Central line was placed.Double lumen Procedure performed using ultrasound guided technique. Ultrasound Notes:anatomy identified, needle tip was noted to be adjacent to the nerve/plexus identified, no ultrasound evidence of intravascular and/or intraneural injection and image(s) printed for medical record Attempts: 1 Following insertion, dressing applied, line sutured and Biopatch. Post procedure assessment: blood return through all ports  Patient tolerated the procedure well with no immediate complications.

## 2017-09-01 ENCOUNTER — Inpatient Hospital Stay (HOSPITAL_COMMUNITY): Payer: Medicaid Other

## 2017-09-01 ENCOUNTER — Encounter (HOSPITAL_COMMUNITY): Admission: EM | Disposition: A | Discharge status: Rehabilitation Facility | Payer: Self-pay | Source: Home / Self Care

## 2017-09-01 ENCOUNTER — Inpatient Hospital Stay (HOSPITAL_COMMUNITY): Payer: Medicaid Other | Admitting: Anesthesiology

## 2017-09-01 ENCOUNTER — Encounter (HOSPITAL_COMMUNITY): Payer: Self-pay | Admitting: Student

## 2017-09-01 HISTORY — PX: OPEN REDUCTION INTERNAL FIXATION (ORIF) DISTAL RADIAL FRACTURE: SHX5989

## 2017-09-01 HISTORY — PX: ORIF CLAVICULAR FRACTURE: SHX5055

## 2017-09-01 HISTORY — PX: ORIF PELVIC FRACTURE WITH PERCUTANEOUS SCREWS: SHX6800

## 2017-09-01 LAB — PREPARE RBC (CROSSMATCH)

## 2017-09-01 LAB — CBC
HCT: 27.5 % — ABNORMAL LOW (ref 36.0–46.0)
HEMOGLOBIN: 9 g/dL — AB (ref 12.0–15.0)
MCH: 27.8 pg (ref 26.0–34.0)
MCHC: 32.7 g/dL (ref 30.0–36.0)
MCV: 84.9 fL (ref 78.0–100.0)
Platelets: 120 10*3/uL — ABNORMAL LOW (ref 150–400)
RBC: 3.24 MIL/uL — AB (ref 3.87–5.11)
RDW: 14.2 % (ref 11.5–15.5)
WBC: 8.4 10*3/uL (ref 4.0–10.5)

## 2017-09-01 SURGERY — OPEN REDUCTION INTERNAL FIXATION (ORIF) CLAVICULAR FRACTURE
Anesthesia: General | Laterality: Right

## 2017-09-01 MED ORDER — VANCOMYCIN HCL 1000 MG IV SOLR
INTRAVENOUS | Status: DC | PRN
  Administered 2017-09-01 (×2): 1000 mg via TOPICAL

## 2017-09-01 MED ORDER — BACITRACIN ZINC 500 UNIT/GM EX OINT
TOPICAL_OINTMENT | CUTANEOUS | Status: AC
  Filled 2017-09-01: qty 28.35

## 2017-09-01 MED ORDER — HYDROMORPHONE HCL 2 MG/ML IJ SOLN
INTRAMUSCULAR | Status: AC
  Filled 2017-09-01: qty 1

## 2017-09-01 MED ORDER — SODIUM CHLORIDE 0.9% FLUSH
10.0000 mL | Freq: Two times a day (BID) | INTRAVENOUS | Status: DC
  Administered 2017-09-01 – 2017-09-08 (×10): 10 mL

## 2017-09-01 MED ORDER — BACITRACIN ZINC 500 UNIT/GM EX OINT
TOPICAL_OINTMENT | CUTANEOUS | Status: DC | PRN
  Administered 2017-09-01: 1 via TOPICAL

## 2017-09-01 MED ORDER — KETOROLAC TROMETHAMINE 30 MG/ML IJ SOLN
INTRAMUSCULAR | Status: AC
  Filled 2017-09-01: qty 1

## 2017-09-01 MED ORDER — PROPOFOL 10 MG/ML IV BOLUS
INTRAVENOUS | Status: AC
  Filled 2017-09-01: qty 40

## 2017-09-01 MED ORDER — CEFAZOLIN SODIUM-DEXTROSE 2-3 GM-%(50ML) IV SOLR
INTRAVENOUS | Status: DC | PRN
  Administered 2017-09-01: 2 g via INTRAVENOUS

## 2017-09-01 MED ORDER — DEXAMETHASONE SODIUM PHOSPHATE 10 MG/ML IJ SOLN
INTRAMUSCULAR | Status: AC
  Filled 2017-09-01: qty 1

## 2017-09-01 MED ORDER — CEFAZOLIN SODIUM 1 G IJ SOLR
INTRAMUSCULAR | Status: AC
  Filled 2017-09-01: qty 20

## 2017-09-01 MED ORDER — HYDROMORPHONE HCL 2 MG/ML IJ SOLN
0.2500 mg | INTRAMUSCULAR | Status: DC | PRN
  Administered 2017-09-01 (×4): 0.5 mg via INTRAVENOUS

## 2017-09-01 MED ORDER — OXYCODONE HCL 5 MG PO TABS
ORAL_TABLET | ORAL | Status: AC
  Filled 2017-09-01: qty 1

## 2017-09-01 MED ORDER — EPHEDRINE SULFATE 50 MG/ML IJ SOLN
INTRAMUSCULAR | Status: AC
  Filled 2017-09-01: qty 1

## 2017-09-01 MED ORDER — SUGAMMADEX SODIUM 200 MG/2ML IV SOLN
INTRAVENOUS | Status: DC | PRN
  Administered 2017-09-01: 122.4 mg via INTRAVENOUS

## 2017-09-01 MED ORDER — FENTANYL CITRATE (PF) 250 MCG/5ML IJ SOLN
INTRAMUSCULAR | Status: DC | PRN
  Administered 2017-09-01 (×5): 50 ug via INTRAVENOUS
  Administered 2017-09-01: 100 ug via INTRAVENOUS
  Administered 2017-09-01 (×3): 50 ug via INTRAVENOUS

## 2017-09-01 MED ORDER — FENTANYL CITRATE (PF) 250 MCG/5ML IJ SOLN
INTRAMUSCULAR | Status: AC
  Filled 2017-09-01: qty 5

## 2017-09-01 MED ORDER — OXYCODONE HCL 5 MG PO TABS
5.0000 mg | ORAL_TABLET | Freq: Once | ORAL | Status: AC | PRN
  Administered 2017-09-01: 5 mg via ORAL

## 2017-09-01 MED ORDER — PROPOFOL 10 MG/ML IV BOLUS
INTRAVENOUS | Status: DC | PRN
  Administered 2017-09-01 (×2): 20 mg via INTRAVENOUS
  Administered 2017-09-01: 110 mg via INTRAVENOUS

## 2017-09-01 MED ORDER — ONDANSETRON HCL 4 MG/2ML IJ SOLN
INTRAMUSCULAR | Status: AC
  Filled 2017-09-01: qty 2

## 2017-09-01 MED ORDER — SUGAMMADEX SODIUM 200 MG/2ML IV SOLN
INTRAVENOUS | Status: AC
  Filled 2017-09-01: qty 2

## 2017-09-01 MED ORDER — VANCOMYCIN HCL 1000 MG IV SOLR
INTRAVENOUS | Status: AC
  Filled 2017-09-01: qty 4000

## 2017-09-01 MED ORDER — CHLORHEXIDINE GLUCONATE CLOTH 2 % EX PADS
6.0000 | MEDICATED_PAD | Freq: Every day | CUTANEOUS | Status: DC
  Administered 2017-09-03 – 2017-09-07 (×5): 6 via TOPICAL

## 2017-09-01 MED ORDER — ROCURONIUM BROMIDE 100 MG/10ML IV SOLN
INTRAVENOUS | Status: DC | PRN
  Administered 2017-09-01: 50 mg via INTRAVENOUS
  Administered 2017-09-01 (×2): 10 mg via INTRAVENOUS
  Administered 2017-09-01 (×2): 20 mg via INTRAVENOUS

## 2017-09-01 MED ORDER — ROCURONIUM BROMIDE 50 MG/5ML IV SOLN
INTRAVENOUS | Status: AC
  Filled 2017-09-01: qty 2

## 2017-09-01 MED ORDER — ONDANSETRON HCL 4 MG/2ML IJ SOLN: 4.0000 mg | Freq: Once | INTRAMUSCULAR | Status: DC | PRN

## 2017-09-01 MED ORDER — OXYCODONE HCL 5 MG/5ML PO SOLN: 5.0000 mg | Freq: Once | ORAL | Status: AC | PRN

## 2017-09-01 MED ORDER — SODIUM CHLORIDE 0.9 % IV SOLN: Freq: Once | INTRAVENOUS | Status: DC

## 2017-09-01 MED ORDER — MIDAZOLAM HCL 2 MG/2ML IJ SOLN
INTRAMUSCULAR | Status: AC
Start: 2017-09-01 — End: ?
  Filled 2017-09-01: qty 2

## 2017-09-01 MED ORDER — 0.9 % SODIUM CHLORIDE (POUR BTL) OPTIME
TOPICAL | Status: DC | PRN
  Administered 2017-09-01: 1000 mL

## 2017-09-01 MED ORDER — ONDANSETRON HCL 4 MG/2ML IJ SOLN
INTRAMUSCULAR | Status: DC | PRN
  Administered 2017-09-01: 4 mg via INTRAVENOUS

## 2017-09-01 MED ORDER — SODIUM CHLORIDE 0.9% FLUSH: 10.0000 mL | INTRAVENOUS | Status: DC | PRN

## 2017-09-01 SURGICAL SUPPLY — 89 items
BANDAGE ACE 4X5 VEL STRL LF (GAUZE/BANDAGES/DRESSINGS) ×5 IMPLANT
BIT DRILL 100X2XQC STRL (BIT) ×3 IMPLANT
BIT DRILL CALIBRATED 1.8MM (BIT) ×3 IMPLANT
BIT DRILL CANN 4.5MM (BIT) ×3 IMPLANT
BIT DRILL CANN LRG QC 5X300 (BIT) ×5 IMPLANT
BIT DRILL LCP QC 2X140 (BIT) ×5 IMPLANT
BIT DRILL QC 2.0X100 (BIT) ×2
BIT DRL 100X2XQC STRL (BIT) ×3
BLADE CLIPPER SURG (BLADE) IMPLANT
BLADE SURG 11 STRL SS (BLADE) ×5 IMPLANT
BNDG COHESIVE 4X5 TAN STRL (GAUZE/BANDAGES/DRESSINGS) IMPLANT
BNDG COHESIVE 6X5 TAN STRL LF (GAUZE/BANDAGES/DRESSINGS) ×5 IMPLANT
BNDG PLASTER X FAST 5X5 WHT LF (CAST SUPPLIES) ×5 IMPLANT
BRUSH SCRUB SURG 4.25 DISP (MISCELLANEOUS) ×10 IMPLANT
CHLORAPREP W/TINT 26ML (MISCELLANEOUS) ×15 IMPLANT
COVER LIGHT HANDLE STERIS (MISCELLANEOUS) ×20 IMPLANT
COVER SURGICAL LIGHT HANDLE (MISCELLANEOUS) ×10 IMPLANT
DERMABOND ADHESIVE PROPEN (GAUZE/BANDAGES/DRESSINGS) ×2
DERMABOND ADVANCED (GAUZE/BANDAGES/DRESSINGS) ×2
DERMABOND ADVANCED .7 DNX12 (GAUZE/BANDAGES/DRESSINGS) ×3 IMPLANT
DERMABOND ADVANCED .7 DNX6 (GAUZE/BANDAGES/DRESSINGS) ×3 IMPLANT
DRAPE C-ARM 42X72 X-RAY (DRAPES) ×10 IMPLANT
DRAPE C-ARMOR (DRAPES) ×5 IMPLANT
DRAPE INCISE IOBAN 66X45 STRL (DRAPES) ×15 IMPLANT
DRAPE ORTHO SPLIT 77X108 STRL (DRAPES) ×4
DRAPE PROXIMA HALF (DRAPES) ×5 IMPLANT
DRAPE SURG 17X23 STRL (DRAPES) ×30 IMPLANT
DRAPE SURG ORHT 6 SPLT 77X108 (DRAPES) ×6 IMPLANT
DRAPE U-SHAPE 47X51 STRL (DRAPES) ×10 IMPLANT
DRAPE UNIVERSAL PACK (DRAPES) ×10 IMPLANT
DRILL BIT CANN 4.5MM (BIT) ×2
DRILL CALIBRATED 1.8MM (BIT) ×5
DRSG EMULSION OIL 3X3 NADH (GAUZE/BANDAGES/DRESSINGS) ×5 IMPLANT
DRSG MEPILEX BORDER 4X4 (GAUZE/BANDAGES/DRESSINGS) ×5 IMPLANT
DRSG MEPILEX BORDER 4X8 (GAUZE/BANDAGES/DRESSINGS) ×5 IMPLANT
ELECT REM PT RETURN 9FT ADLT (ELECTROSURGICAL) ×5
ELECTRODE REM PT RTRN 9FT ADLT (ELECTROSURGICAL) ×3 IMPLANT
GAUZE SPONGE 4X4 12PLY STRL LF (GAUZE/BANDAGES/DRESSINGS) ×5 IMPLANT
GLOVE BIO SURGEON STRL SZ7.5 (GLOVE) ×30 IMPLANT
GLOVE BIOGEL PI IND STRL 7.5 (GLOVE) ×6 IMPLANT
GLOVE BIOGEL PI INDICATOR 7.5 (GLOVE) ×4
GLOVE ECLIPSE 7.0 STRL STRAW (GLOVE) ×15 IMPLANT
GLOVE INDICATOR 7.5 STRL GRN (GLOVE) ×25 IMPLANT
GOWN STRL REUS W/ TWL LRG LVL3 (GOWN DISPOSABLE) ×6 IMPLANT
GOWN STRL REUS W/TWL LRG LVL3 (GOWN DISPOSABLE) ×4
GUIDEWIRE 2.0MM (WIRE) ×15 IMPLANT
GUIDEWIRE THREADED 2.8MM (WIRE) ×20 IMPLANT
KIT BASIN OR (CUSTOM PROCEDURE TRAY) ×5 IMPLANT
KIT TURNOVER KIT B (KITS) ×5 IMPLANT
MANIFOLD NEPTUNE II (INSTRUMENTS) IMPLANT
NEEDLE HYPO 25GX1X1/2 BEV (NEEDLE) IMPLANT
NS IRRIG 1000ML POUR BTL (IV SOLUTION) ×5 IMPLANT
PACK GENERAL/GYN (CUSTOM PROCEDURE TRAY) ×5 IMPLANT
PACK TOTAL JOINT (CUSTOM PROCEDURE TRAY) ×5 IMPLANT
PAD ARMBOARD 7.5X6 YLW CONV (MISCELLANEOUS) ×20 IMPLANT
PAD CAST 4YDX4 CTTN HI CHSV (CAST SUPPLIES) ×3 IMPLANT
PADDING CAST COTTON 4X4 STRL (CAST SUPPLIES) ×2
PLATE LOCK LCP F/2.7 6H (Plate) ×5 IMPLANT
PLATE LOCK LCP F/2.7X94 10H (Plate) ×5 IMPLANT
PLATE VOLAR RT 3H/2CP-VA DRP (Plate) ×5 IMPLANT
SCREW CANN 6.5X100 32MM THRD (Screw) ×5 IMPLANT
SCREW CORTEX 2.7 SLF-TPNG 16MM (Screw) ×15 IMPLANT
SCREW CORTEX SLFTPNG 20MM 2.4 (Screw) ×5 IMPLANT
SCREW LOCKING 2.4 VA 14MM (Screw) ×5 IMPLANT
SCREW SELF TAP 12M (Screw) ×15 IMPLANT
SCREW SELF TAP 14MM (Screw) ×20 IMPLANT
SCREW VA LOCKING 2.4X16 (Screw) ×5 IMPLANT
SCREW VA LOCKING 2.4X18 (Screw) ×10 IMPLANT
SLING ARM FOAM STRAP MED (SOFTGOODS) ×5 IMPLANT
SLING ARM IMMOBILIZER LRG (SOFTGOODS) IMPLANT
SLING ARM IMMOBILIZER MED (SOFTGOODS) ×10 IMPLANT
SPONGE LAP 18X18 X RAY DECT (DISPOSABLE) IMPLANT
STAPLER VISISTAT 35W (STAPLE) ×10 IMPLANT
STOCKINETTE 6  STRL (DRAPES) ×4
STOCKINETTE 6 STRL (DRAPES) ×6 IMPLANT
STOCKINETTE IMPERVIOUS 9X36 MD (GAUZE/BANDAGES/DRESSINGS) IMPLANT
SUCTION FRAZIER HANDLE 10FR (MISCELLANEOUS) ×2
SUCTION TUBE FRAZIER 10FR DISP (MISCELLANEOUS) ×3 IMPLANT
SUT MNCRL AB 3-0 PS2 18 (SUTURE) ×5 IMPLANT
SUT MNCRL AB 3-0 PS2 27 (SUTURE) ×5 IMPLANT
SUT MON AB 2-0 CT1 36 (SUTURE) ×5 IMPLANT
SUT VIC AB 0 CT1 27 (SUTURE) ×2
SUT VIC AB 0 CT1 27XBRD ANBCTR (SUTURE) ×3 IMPLANT
SUT VIC AB 2-0 CT1 27 (SUTURE) ×2
SUT VIC AB 2-0 CT1 TAPERPNT 27 (SUTURE) ×3 IMPLANT
SYR CONTROL 10ML LL (SYRINGE) IMPLANT
TOWEL OR 17X26 10 PK STRL BLUE (TOWEL DISPOSABLE) ×5 IMPLANT
TRAY FOLEY MTR SLVR 16FR STAT (SET/KITS/TRAYS/PACK) IMPLANT
WATER STERILE IRR 1000ML POUR (IV SOLUTION) ×5 IMPLANT

## 2017-09-01 NOTE — Anesthesia Procedure Notes (Signed)
Central Venous Catheter Insertion Performed by: anesthesiologist Start/End5/01/2018 8:20 AM, 09/01/2017 8:25 AM Patient location: OR. Preanesthetic checklist: patient identified, IV checked, site marked, risks and benefits discussed, surgical consent, monitors and equipment checked, pre-op evaluation, timeout performed and anesthesia consent Lidocaine 1% used for infiltration and patient sedated Hand hygiene performed  and maximum sterile barriers used  Catheter size: 8 Fr Total catheter length 16. Central line was placed.Double lumen Procedure performed using ultrasound guided technique. Ultrasound Notes:image(s) printed for medical record Attempts: 1 Following insertion, dressing applied and line sutured. Post procedure assessment: blood return through all ports  Patient tolerated the procedure well with no immediate complications.
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
## 2017-09-01 NOTE — Anesthesia Procedure Notes (Signed)
Arterial Line Insertion Start/End5/01/2018 7:30 AM, 09/01/2017 7:35 AM Performed by: Kipp Brood, MD, anesthesiologist  Left, radial was placed Catheter size: 20 G Hand hygiene performed , maximum sterile barriers used  and Seldinger technique used Allen's test indicative of satisfactory collateral circulation Attempts: 2 Procedure performed without using ultrasound guided technique. Following insertion, dressing applied and Biopatch. Patient tolerated the procedure well with no immediate complications.

## 2017-09-01 NOTE — Progress Notes (Signed)
Trauma MD paged regarding patient having an 11-beat run of V-tach; relatively asymptomatic, and self-resolved. New lab orders placed, and drawn. Requested to discontinue transfer order, being the patient has uncontrolled pain, unstable Hgb levels, etc. Will continue to monitor closely. Pt to remain in ICU, awaiting surgery in the am.

## 2017-09-01 NOTE — Anesthesia Procedure Notes (Signed)
Procedure Name: Intubation Date/Time: 09/01/2017 7:38 AM Performed by: Glynda Jaeger, CRNA Pre-anesthesia Checklist: Patient identified, Patient being monitored, Timeout performed, Emergency Drugs available and Suction available Patient Re-evaluated:Patient Re-evaluated prior to induction Oxygen Delivery Method: Circle System Utilized Preoxygenation: Pre-oxygenation with 100% oxygen Induction Type: IV induction Ventilation: Mask ventilation without difficulty Laryngoscope Size: Mac and 3 Grade View: Grade II Tube type: Oral Tube size: 7.5 mm Number of attempts: 1 Airway Equipment and Method: Stylet Placement Confirmation: ETT inserted through vocal cords under direct vision,  positive ETCO2 and breath sounds checked- equal and bilateral Secured at: 22 cm Tube secured with: Tape Dental Injury: Teeth and Oropharynx as per pre-operative assessment

## 2017-09-01 NOTE — Anesthesia Preprocedure Evaluation (Addendum)
Anesthesia Evaluation  Patient identified by MRN, date of birth, ID band Patient awake    Reviewed: Allergy & Precautions, NPO status , Patient's Chart, lab work & pertinent test results  Airway Mallampati: II  TM Distance: >3 FB Neck ROM: Limited  Mouth opening: Limited Mouth Opening  Dental  (+) Teeth Intact, Dental Advisory Given   Pulmonary Current Smoker,     + decreased breath sounds      Cardiovascular  Rhythm:Regular Rate:Normal     Neuro/Psych    GI/Hepatic   Endo/Other    Renal/GU      Musculoskeletal   Abdominal   Peds  Hematology   Anesthesia Other Findings   Reproductive/Obstetrics                            Anesthesia Physical Anesthesia Plan  ASA: III  Anesthesia Plan: General   Post-op Pain Management:    Induction: Intravenous  PONV Risk Score and Plan: Ondansetron and Dexamethasone  Airway Management Planned: Oral ETT  Additional Equipment:   Intra-op Plan:   Post-operative Plan: Possible Post-op intubation/ventilation  Informed Consent: I have reviewed the patients History and Physical, chart, labs and discussed the procedure including the risks, benefits and alternatives for the proposed anesthesia with the patient or authorized representative who has indicated his/her understanding and acceptance.   Dental advisory given  Plan Discussed with: CRNA and Anesthesiologist  Anesthesia Plan Comments:         Anesthesia Quick Evaluation

## 2017-09-01 NOTE — Progress Notes (Signed)
Pt arrived back to 4N30 from PACU at approx 1500 without the room's portable X2 Phillip's monitor, therefore was not able to be hooked up to monitor in 4N30. Was left on PACU monitor until 1520, VS at that time were recorded and documented. Monitor from other room connected, IT at bedside to connect to Epic. Next VS obtained from [place] monitor at 1600 and filed at that time. Pt remained alert and conversant throughout.

## 2017-09-01 NOTE — Progress Notes (Signed)
Orthopedic Tech Progress Note Patient Details:  [name] 23-Dec-1980 [id number]  Ortho Devices Type of Ortho Device: CAM walker Ortho Device/Splint Interventions: Application   Post Interventions Patient Tolerated: Well Instructions Provided: Care of device   Saul Fordyce 09/01/2017, 10:47 AM

## 2017-09-01 NOTE — Transfer of Care (Signed)
Immediate Anesthesia Transfer of Care Note  Patient: [name]  Procedure(s) Performed: OPEN REDUCTION INTERNAL FIXATION (ORIF) CLAVICULAR FRACTURE (Left ) ORIF PELVIC FRACTURE WITH PERCUTANEOUS SCREWS (N/A ) OPEN REDUCTION INTERNAL FIXATION (ORIF) DISTAL RADIAL FRACTURE (Right )  Patient Location: PACU  Anesthesia Type:General  Level of Consciousness: awake, alert , patient cooperative and responds to stimulation  Airway & Oxygen Therapy: Patient Spontanous Breathing and Patient connected to face mask oxygen  Post-op Assessment: Report given to RN and Post -op Vital signs reviewed and stable  Post vital signs: Reviewed and stable  Last Vitals:  Vitals Value Taken Time  BP 132/77 09/01/2017  1:32 PM  Temp    Pulse 105 09/01/2017  1:33 PM  Resp 13 09/01/2017  1:33 PM  SpO2 100 % 09/01/2017  1:33 PM  Vitals shown include unvalidated device data.  Last Pain:  Vitals:   09/01/17 0600  TempSrc:   PainSc: 10-Worst pain ever      Patients Stated Pain Goal: 4 (08/30/17 2004)  Complications: No apparent anesthesia complications

## 2017-09-02 LAB — TYPE AND SCREEN
ABO/RH(D): AB POS
Antibody Screen: NEGATIVE
UNIT DIVISION: 0
UNIT DIVISION: 0
Unit division: 0
Unit division: 0
Unit division: 0
Unit division: 0

## 2017-09-02 LAB — BPAM RBC
BLOOD PRODUCT EXPIRATION DATE: 201905292359
BLOOD PRODUCT EXPIRATION DATE: 201905312359
Blood Product Expiration Date: 201905302359
Blood Product Expiration Date: 201906012359
Blood Product Expiration Date: 201906032359
Blood Product Expiration Date: 201906042359
ISSUE DATE / TIME: 201905080036
ISSUE DATE / TIME: 201905091226
ISSUE DATE / TIME: 201905091452
ISSUE DATE / TIME: 201905100708
ISSUE DATE / TIME: 201905100708
ISSUE DATE / TIME: 201905110232
UNIT TYPE AND RH: 5100
UNIT TYPE AND RH: 9500
Unit Type and Rh: 5100
Unit Type and Rh: 8400
Unit Type and Rh: 8400
Unit Type and Rh: 9500

## 2017-09-02 LAB — CBC WITH DIFFERENTIAL/PLATELET
BASOS ABS: 0 10*3/uL (ref 0.0–0.1)
BASOS PCT: 0 %
EOS PCT: 4 %
Eosinophils Absolute: 0.4 10*3/uL (ref 0.0–0.7)
HCT: 31.9 % — ABNORMAL LOW (ref 36.0–46.0)
Hemoglobin: 10.8 g/dL — ABNORMAL LOW (ref 12.0–15.0)
Lymphocytes Relative: 17 %
Lymphs Abs: 1.4 10*3/uL (ref 0.7–4.0)
MCH: 28.7 pg (ref 26.0–34.0)
MCHC: 33.9 g/dL (ref 30.0–36.0)
MCV: 84.8 fL (ref 78.0–100.0)
MONO ABS: 0.7 10*3/uL (ref 0.1–1.0)
Monocytes Relative: 9 %
NEUTROS ABS: 5.7 10*3/uL (ref 1.7–7.7)
Neutrophils Relative %: 70 %
Platelets: 123 10*3/uL — ABNORMAL LOW (ref 150–400)
RBC: 3.76 MIL/uL — ABNORMAL LOW (ref 3.87–5.11)
RDW: 14.4 % (ref 11.5–15.5)
WBC: 8.1 10*3/uL (ref 4.0–10.5)

## 2017-09-02 LAB — BASIC METABOLIC PANEL
ANION GAP: 7 (ref 5–15)
BUN: 5 mg/dL — AB (ref 6–20)
CO2: 30 mmol/L (ref 22–32)
CREATININE: 0.57 mg/dL (ref 0.44–1.00)
Calcium: 7.2 mg/dL — ABNORMAL LOW (ref 8.9–10.3)
Chloride: 102 mmol/L (ref 101–111)
GFR calc non Af Amer: >60 mL/min (ref >60)
Glucose, Bld: 103 mg/dL — ABNORMAL HIGH (ref 65–99)
Potassium: 3.2 mmol/L — ABNORMAL LOW (ref 3.5–5.1)
SODIUM: 139 mmol/L (ref 135–145)

## 2017-09-02 MED ORDER — HYDROMORPHONE HCL 2 MG/ML IJ SOLN
1.0000 mg | INTRAMUSCULAR | Status: DC | PRN
  Administered 2017-09-02 – 2017-09-04 (×20): 2 mg via INTRAVENOUS
  Filled 2017-09-02 (×21): qty 1
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
## 2017-09-02 NOTE — Plan of Care (Signed)

## 2017-09-02 NOTE — Progress Notes (Signed)
Patient was resting comfortably upon arrival. Will check in later with patient when awake and alert for exam and follow up.
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
## 2017-09-02 NOTE — Progress Notes (Signed)
Inpatient Rehabilitation  Per PT, OT request the patient was screened by Fae Pippin for appropriateness for an Inpatient Acute Rehab consult.  At this time we are recommending an Inpatient Rehab consult.  Please order if you are agreeable.    Charlane Ferretti., CCC/SLP Admission Coordinator  [facility] Inpatient Rehabilitation  Cell [phone]

## 2017-09-03 ENCOUNTER — Encounter (HOSPITAL_COMMUNITY): Payer: Self-pay | Admitting: *Deleted

## 2017-09-03 MED ORDER — NAPHAZOLINE-GLYCERIN 0.012-0.2 % OP SOLN
1.0000 [drp] | Freq: Four times a day (QID) | OPHTHALMIC | Status: DC | PRN
  Administered 2017-09-03: 1 [drp] via OPHTHALMIC
  Filled 2017-09-03: qty 15

## 2017-09-03 NOTE — Anesthesia Postprocedure Evaluation (Signed)
Anesthesia Post Note  Patient: [name]  Procedure(s) Performed: OPEN REDUCTION INTERNAL FIXATION (ORIF) CLAVICULAR FRACTURE (Left ) ORIF PELVIC FRACTURE WITH PERCUTANEOUS SCREWS (N/A ) OPEN REDUCTION INTERNAL FIXATION (ORIF) DISTAL RADIAL FRACTURE (Right )     Patient location during evaluation: PACU Anesthesia Type: General Level of consciousness: awake and alert Pain management: pain level controlled Vital Signs Assessment: post-procedure vital signs reviewed and stable Respiratory status: spontaneous breathing, nonlabored ventilation, respiratory function stable and patient connected to nasal cannula oxygen Cardiovascular status: blood pressure returned to baseline and stable Postop Assessment: no apparent nausea or vomiting Anesthetic complications: no    Last Vitals:  Vitals:   09/02/17 2002 09/03/17 1452  BP: 108/66 113/67  Pulse: 91 96  Resp:    Temp: 37.3 C 36.8 C  SpO2: 98% 98%    Last Pain:  Vitals:   09/03/17 1619  TempSrc:   PainSc: 10-Worst pain ever                 Kiing Deakin COKER

## 2017-09-03 NOTE — Plan of Care (Signed)
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
## 2017-09-03 NOTE — Progress Notes (Signed)
Trauma Service Note  Subjective: Patient comfortable.  Noa cute distress.  Sat up at the bedside with PT yesterday.  Will begin more intensive therapy tomorrow.  Objective: Vital signs in last 24 hours: Temp:  [99 F (37.2 C)-99.2 F (37.3 C)] 99.2 F (37.3 C) (05/11 2002) Pulse Rate:  [91-104] 91 (05/11 2002) Resp:  [10-16] 16 (05/11 1641) BP: (107-125)/(66-92) 108/66 (05/11 2002) SpO2:  [98 %-100 %] 98 % (05/11 2002) Last BM Date: 08/30/17  Intake/Output from previous day: 05/11 0701 - 05/12 0700 In: 100 [IV Piggyback:100] Out: 750 [Urine:750] Intake/Output this shift: No intake/output data recorded.  General: No distress.  Lungs: Clear  Abd: Benign   Extremities: Splinted as before.  Foley in place  Neuro: Intact  Lab Results: CBC  Recent Labs    09/01/17 0424 09/02/17 0457  WBC 8.4 8.1  HGB 9.0* 10.8*  HCT 27.5* 31.9*  PLT 120* 123*   BMET Recent Labs    08/31/17 2033 09/02/17 0457  NA 141 139  K 3.5 3.2*  CL 106 102  CO2 30 30  GLUCOSE 100* 103*  BUN 8 5*  CREATININE 0.68 0.57  CALCIUM 7.2* 7.2*   PT/INR No results for input(s): LABPROT, INR in the last 72 hours. ABG No results for input(s): PHART, HCO3 in the last 72 hours.  Invalid input(s): PCO2, PO2  Studies/Results: Dg Clavicle Left  Result Date: 09/01/2017 CLINICAL DATA:  Hardware fixation of a left clavicle fracture. EXAM: LEFT CLAVICLE - 2+ VIEWS COMPARISON:  Earlier today and 08/30/2017. FINDINGS: Interval screw plate fixation of the previously demonstrated mid left clavicle fracture with anatomic position and alignment. IMPRESSION: Hardware fixation of the previously demonstrated left clavicle fracture with anatomic position and alignment. Electronically Signed   By: Beckie Salts M.D.   On: 09/01/2017 16:37   Dg Wrist Complete Right  Result Date: 09/01/2017 CLINICAL DATA:  Hardware fixation of a comminuted distal radius fracture. EXAM: RIGHT WRIST - COMPLETE 3+ VIEW COMPARISON:   Earlier today and 08/31/2017. FINDINGS: Interval screw and plate fixation of the previously demonstrated distal radius fracture with anatomic position and alignment. IMPRESSION: Screw plate fixation of the previously demonstrated distal radius fracture without pelvic position and alignment. Electronically Signed   By: Beckie Salts M.D.   On: 09/01/2017 16:38   Dg Pelvis Comp Min 3v  Result Date: 09/01/2017 CLINICAL DATA:  Hardware fixation of pelvic and left femur fractures. EXAM: JUDET PELVIS - 3+ VIEW COMPARISON:  Pelvis radiographs dated 08/30/2017 and pelvis CT dated 08/30/2017. FINDINGS: Interval screw fixation of the previously demonstrated left acetabular fracture and screw fixation of the previously demonstrated right superior pubic ramus fracture with essentially anatomic position and alignment. Intramedullary rod and screw fixation of the previously demonstrated proximal left femoral comminuted fracture with essentially anatomic position and alignment of the major fragments. A mildly comminuted right inferior pubic ramus fracture is again demonstrated with 1/2 shaft width of inferior displacement of the medial fragment. Mild widening of the symphysis pubis. IMPRESSION: Hardware fixation of pelvis and left femur fractures, as described above. Electronically Signed   By: Beckie Salts M.D.   On: 09/01/2017 16:35   Dg Pelvis Comp Min 3v  Result Date: 09/01/2017 CLINICAL DATA:  ORIF of pelvic fractures EXAM: JUDET PELVIS - 3+ VIEW; DG C-ARM 61-120 MIN COMPARISON:  08/30/2017 FLUOROSCOPY TIME:  Fluoroscopy Time:  8 minutes 34 seconds Radiation Exposure Index (if provided by the fluoroscopic device): 88.1 mGy Number of Acquired Spot Images: 10 FINDINGS: Initial  images again demonstrate prior fixation in the proximal left femur. Multiple pelvic fractures are again identified bilaterally. Subsequent images demonstrate placement of fixation screws traversing the acetabulum on the left extending into the  superior pubic ramus. Subsequent fixation screw traversing the superior pubic ramus on the right is noted. Fracture fragments are in near anatomic alignment. IMPRESSION: ORIF of bilateral pelvic fractures Electronically Signed   By: Alcide Clever M.D.   On: 09/01/2017 14:45   Dg Chest Port 1 View  Result Date: 09/01/2017 CLINICAL DATA:  Status post right jugular catheter placement. EXAM: PORTABLE CHEST 1 VIEW COMPARISON:  None. FINDINGS: Right jugular catheter tip in the superior vena cava. No pneumothorax. Normal sized heart. Clear lungs. Hardware fixation of a left clavicle fracture. IMPRESSION: Right jugular catheter tip in the superior vena cava. No pneumothorax. Electronically Signed   By: Beckie Salts M.D.   On: 09/01/2017 16:36   Dg C-arm 1-60 Min  Result Date: 09/01/2017 CLINICAL DATA:  ORIF of pelvic fractures EXAM: JUDET PELVIS - 3+ VIEW; DG C-ARM 61-120 MIN COMPARISON:  08/30/2017 FLUOROSCOPY TIME:  Fluoroscopy Time:  8 minutes 34 seconds Radiation Exposure Index (if provided by the fluoroscopic device): 88.1 mGy Number of Acquired Spot Images: 10 FINDINGS: Initial images again demonstrate prior fixation in the proximal left femur. Multiple pelvic fractures are again identified bilaterally. Subsequent images demonstrate placement of fixation screws traversing the acetabulum on the left extending into the superior pubic ramus. Subsequent fixation screw traversing the superior pubic ramus on the right is noted. Fracture fragments are in near anatomic alignment. IMPRESSION: ORIF of bilateral pelvic fractures Electronically Signed   By: Alcide Clever M.D.   On: 09/01/2017 14:45    Anti-infectives: Anti-infectives (From admission, onward)   Start     Dose/Rate Route Frequency Ordered Stop   09/01/17 0903  vancomycin (VANCOCIN) powder  Status:  Discontinued       As needed 09/01/17 0903 09/01/17 1317   08/30/17 1600  cefTRIAXone (ROCEPHIN) 2 g in sodium chloride 0.9 % 100 mL IVPB     2 g 200  mL/hr over 30 Minutes Intravenous Every 24 hours 08/30/17 1356 09/02/17 1620   08/30/17 1040  tobramycin (NEBCIN) injection  Status:  Discontinued       As needed 08/30/17 1041 08/30/17 1219   08/30/17 0834  vancomycin (VANCOCIN) powder  Status:  Discontinued       As needed 08/30/17 0834 08/30/17 1219   08/30/17 0723  ceFAZolin (ANCEF) 2-4 GM/100ML-% IVPB    Note to Pharmacy:  Yvonne Kendall   : cabinet override      08/30/17 0723 08/30/17 1929   08/30/17 0100  ceFAZolin (ANCEF) IVPB 2g/100 mL premix     2 g 200 mL/hr over 30 Minutes Intravenous  Once 08/30/17 0055 08/30/17 0215      Assessment/Plan: s/p Procedure(s): OPEN REDUCTION INTERNAL FIXATION (ORIF) CLAVICULAR FRACTURE ORIF PELVIC FRACTURE WITH PERCUTANEOUS SCREWS OPEN REDUCTION INTERNAL FIXATION (ORIF) DISTAL RADIAL FRACTURE CPM.  Possible Rehab next week  LOS: 4 days   Marta Lamas. Gae Bon, MD, FACS [phone] Trauma Surgeon 09/03/2017

## 2017-09-04 ENCOUNTER — Encounter (HOSPITAL_COMMUNITY): Payer: Self-pay | Admitting: General Practice

## 2017-09-04 DIAGNOSIS — S72092B Other fracture of head and neck of left femur, initial encounter for open fracture type I or II: Secondary | ICD-10-CM

## 2017-09-04 DIAGNOSIS — T1490XA Injury, unspecified, initial encounter: Secondary | ICD-10-CM

## 2017-09-04 DIAGNOSIS — R310 Gross hematuria: Secondary | ICD-10-CM

## 2017-09-04 DIAGNOSIS — S36039A Unspecified laceration of spleen, initial encounter: Secondary | ICD-10-CM

## 2017-09-04 DIAGNOSIS — D62 Acute posthemorrhagic anemia: Secondary | ICD-10-CM

## 2017-09-04 DIAGNOSIS — Z87898 Personal history of other specified conditions: Secondary | ICD-10-CM

## 2017-09-04 DIAGNOSIS — S32810A Multiple fractures of pelvis with stable disruption of pelvic ring, initial encounter for closed fracture: Secondary | ICD-10-CM

## 2017-09-04 DIAGNOSIS — S3720XA Unspecified injury of bladder, initial encounter: Secondary | ICD-10-CM

## 2017-09-04 DIAGNOSIS — F1910 Other psychoactive substance abuse, uncomplicated: Secondary | ICD-10-CM

## 2017-09-04 DIAGNOSIS — S42022A Displaced fracture of shaft of left clavicle, initial encounter for closed fracture: Secondary | ICD-10-CM

## 2017-09-04 DIAGNOSIS — G8918 Other acute postprocedural pain: Secondary | ICD-10-CM

## 2017-09-04 DIAGNOSIS — S32452A Displaced transverse fracture of left acetabulum, initial encounter for closed fracture: Secondary | ICD-10-CM

## 2017-09-04 DIAGNOSIS — E876 Hypokalemia: Secondary | ICD-10-CM

## 2017-09-04 DIAGNOSIS — T148XXA Other injury of unspecified body region, initial encounter: Secondary | ICD-10-CM

## 2017-09-04 LAB — POCT I-STAT 7, (LYTES, BLD GAS, ICA,H+H)
ACID-BASE EXCESS: 7 mmol/L — AB (ref 0.0–2.0)
ACID-BASE EXCESS: 7 mmol/L — AB (ref 0.0–2.0)
Acid-Base Excess: 5 mmol/L — ABNORMAL HIGH (ref 0.0–2.0)
BICARBONATE: 31.1 mmol/L — AB (ref 20.0–28.0)
BICARBONATE: 28.9 mmol/L — AB (ref 20.0–28.0)
BICARBONATE: 30.8 mmol/L — AB (ref 20.0–28.0)
CALCIUM ION: 1.04 mmol/L — AB (ref 1.15–1.40)
Calcium, Ion: 1.03 mmol/L — ABNORMAL LOW (ref 1.15–1.40)
Calcium, Ion: 1.01 mmol/L — ABNORMAL LOW (ref 1.15–1.40)
HCT: 22 % — ABNORMAL LOW (ref 36.0–46.0)
HCT: 26 % — ABNORMAL LOW (ref 36.0–46.0)
HEMATOCRIT: 24 % — AB (ref 36.0–46.0)
HEMOGLOBIN: 7.5 g/dL — AB (ref 12.0–15.0)
Hemoglobin: 8.8 g/dL — ABNORMAL LOW (ref 12.0–15.0)
Hemoglobin: 8.2 g/dL — ABNORMAL LOW (ref 12.0–15.0)
O2 SAT: 100 %
O2 Saturation: 100 %
O2 Saturation: 100 %
PCO2 ART: 39.7 mmHg (ref 32.0–48.0)
PH ART: 7.483 — AB (ref 7.350–7.450)
PH ART: 7.524 — AB (ref 7.350–7.450)
PO2 ART: 292 mmHg — AB (ref 83.0–108.0)
POTASSIUM: 3.3 mmol/L — AB (ref 3.5–5.1)
Potassium: 3.3 mmol/L — ABNORMAL LOW (ref 3.5–5.1)
Potassium: 3.2 mmol/L — ABNORMAL LOW (ref 3.5–5.1)
SODIUM: 140 mmol/L (ref 135–145)
SODIUM: 137 mmol/L (ref 135–145)
Sodium: 139 mmol/L (ref 135–145)
TCO2: 32 mmol/L (ref 22–32)
TCO2: 30 mmol/L (ref 22–32)
TCO2: 32 mmol/L (ref 22–32)
pCO2 arterial: 41.5 mmHg (ref 32.0–48.0)
pCO2 arterial: 37.3 mmHg (ref 32.0–48.0)
pH, Arterial: 7.47 — ABNORMAL HIGH (ref 7.350–7.450)
pO2, Arterial: 263 mmHg — ABNORMAL HIGH (ref 83.0–108.0)
pO2, Arterial: 246 mmHg — ABNORMAL HIGH (ref 83.0–108.0)

## 2017-09-04 MED ORDER — BISACODYL 10 MG RE SUPP
10.0000 mg | Freq: Every day | RECTAL | Status: DC | PRN
  Administered 2017-09-04: 10 mg via RECTAL
  Filled 2017-09-04: qty 1

## 2017-09-04 MED ORDER — DOCUSATE SODIUM 100 MG PO CAPS
100.0000 mg | ORAL_CAPSULE | Freq: Two times a day (BID) | ORAL | Status: DC
  Administered 2017-09-04 – 2017-09-08 (×6): 100 mg via ORAL
  Filled 2017-09-04 (×8): qty 1

## 2017-09-04 MED ORDER — POLYETHYLENE GLYCOL 3350 17 G PO PACK
17.0000 g | PACK | Freq: Every day | ORAL | Status: DC
  Administered 2017-09-06: 17 g via ORAL
  Filled 2017-09-04 (×4): qty 1

## 2017-09-04 MED ORDER — METHOCARBAMOL 750 MG PO TABS
750.0000 mg | ORAL_TABLET | Freq: Three times a day (TID) | ORAL | Status: DC
  Administered 2017-09-04 – 2017-09-08 (×13): 750 mg via ORAL
  Filled 2017-09-04 (×14): qty 1

## 2017-09-04 MED ORDER — ACETAMINOPHEN 500 MG PO TABS
1000.0000 mg | ORAL_TABLET | Freq: Four times a day (QID) | ORAL | Status: DC
  Administered 2017-09-04 – 2017-09-08 (×13): 1000 mg via ORAL
  Filled 2017-09-04 (×16): qty 2

## 2017-09-04 MED ORDER — HYDROMORPHONE HCL 2 MG/ML IJ SOLN
1.0000 mg | INTRAMUSCULAR | Status: DC | PRN
  Administered 2017-09-04 – 2017-09-05 (×9): 2 mg via INTRAVENOUS
  Administered 2017-09-05: 1 mg via INTRAVENOUS
  Administered 2017-09-05: 2 mg via INTRAVENOUS
  Administered 2017-09-05: 1 mg via INTRAVENOUS
  Administered 2017-09-06 (×3): 2 mg via INTRAVENOUS
  Administered 2017-09-06: 1 mg via INTRAVENOUS
  Administered 2017-09-06: 2 mg via INTRAVENOUS
  Administered 2017-09-06: 1 mg via INTRAVENOUS
  Administered 2017-09-07 (×3): 2 mg via INTRAVENOUS
  Filled 2017-09-04 (×22): qty 1

## 2017-09-04 MED ORDER — ENSURE ENLIVE PO LIQD
237.0000 mL | Freq: Two times a day (BID) | ORAL | Status: DC
  Administered 2017-09-06 – 2017-09-08 (×3): 237 mL via ORAL

## 2017-09-04 MED ORDER — GABAPENTIN 100 MG PO CAPS
200.0000 mg | ORAL_CAPSULE | Freq: Three times a day (TID) | ORAL | Status: DC
  Administered 2017-09-04 (×3): 200 mg via ORAL
  Filled 2017-09-04 (×3): qty 2
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
## 2017-09-04 NOTE — Progress Notes (Addendum)
Inpatient Rehabilitation-Admissions Coordinator   Met with patient and spouse at the bedside to discuss team's recommendation for inpatient rehabilitation. Shared booklets, expectations while in CIR, expected length of stay, and anticipated functional level at DC. Pt informed me that she has  Medicaid; will plan to verify and follow up with the team tomorrow. Pt and family to discuss options tonight. Plan to follow up for decision, for timing of medical readiness and IP Rehab bed availability. Call if questions.   Jhonnie Garner, OTR/L  Rehab Admissions Coordinator  [phone] 09/04/2017 3:21 PM
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
## 2017-09-05 LAB — BASIC METABOLIC PANEL
Anion gap: 7 (ref 5–15)
BUN: 7 mg/dL (ref 6–20)
CHLORIDE: 101 mmol/L (ref 101–111)
CO2: 34 mmol/L — AB (ref 22–32)
CREATININE: 0.61 mg/dL (ref 0.44–1.00)
Calcium: 7.8 mg/dL — ABNORMAL LOW (ref 8.9–10.3)
GFR calc non Af Amer: >60 mL/min (ref >60)
Glucose, Bld: 117 mg/dL — ABNORMAL HIGH (ref 65–99)
POTASSIUM: 2.9 mmol/L — AB (ref 3.5–5.1)
Sodium: 142 mmol/L (ref 135–145)

## 2017-09-05 LAB — CBC
HEMATOCRIT: 33.8 % — AB (ref 36.0–46.0)
HEMOGLOBIN: 11 g/dL — AB (ref 12.0–15.0)
MCH: 28.7 pg (ref 26.0–34.0)
MCHC: 32.5 g/dL (ref 30.0–36.0)
MCV: 88.3 fL (ref 78.0–100.0)
Platelets: 223 10*3/uL (ref 150–400)
RBC: 3.83 MIL/uL — AB (ref 3.87–5.11)
RDW: 14.1 % (ref 11.5–15.5)
WBC: 7.8 10*3/uL (ref 4.0–10.5)

## 2017-09-05 MED ORDER — GABAPENTIN 300 MG PO CAPS
300.0000 mg | ORAL_CAPSULE | Freq: Three times a day (TID) | ORAL | Status: DC
  Administered 2017-09-05 – 2017-09-08 (×10): 300 mg via ORAL
  Filled 2017-09-05 (×10): qty 1

## 2017-09-05 MED ORDER — POTASSIUM CHLORIDE CRYS ER 20 MEQ PO TBCR
40.0000 meq | EXTENDED_RELEASE_TABLET | Freq: Two times a day (BID) | ORAL | Status: AC
  Administered 2017-09-05 (×2): 40 meq via ORAL
  Filled 2017-09-05 (×2): qty 2

## 2017-09-05 MED ORDER — SODIUM CHLORIDE 0.9 % IV SOLN: 250.0000 mL | INTRAVENOUS | Status: DC | PRN

## 2017-09-05 MED ORDER — SODIUM CHLORIDE 0.9% FLUSH: 3.0000 mL | INTRAVENOUS | Status: DC | PRN

## 2017-09-05 MED ORDER — SODIUM CHLORIDE 0.9% FLUSH
3.0000 mL | Freq: Two times a day (BID) | INTRAVENOUS | Status: DC
  Administered 2017-09-05 – 2017-09-08 (×7): 3 mL via INTRAVENOUS

## 2017-09-05 MED ORDER — OXYCODONE HCL 5 MG PO TABS
10.0000 mg | ORAL_TABLET | ORAL | Status: DC | PRN
  Administered 2017-09-05: 10 mg via ORAL
  Administered 2017-09-05 – 2017-09-07 (×8): 15 mg via ORAL
  Filled 2017-09-05 (×5): qty 3
  Filled 2017-09-05: qty 2
  Filled 2017-09-05 (×3): qty 3

## 2017-09-05 MED ORDER — BUPRENORPHINE HCL-NALOXONE HCL 8-2 MG SL SUBL: 1.0000 | SUBLINGUAL_TABLET | Freq: Three times a day (TID) | SUBLINGUAL | Status: DC
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
## 2017-09-05 NOTE — Progress Notes (Addendum)
Inpatient Rehabilitation-Admissions Coordinator   Met with pt, pt's spouse Jacqulynn Cadet), and pt's father Letitia Libra) at the bedside to see if pt had made decision about recommended CIR vs SNF and if a firm DC plan was in place. Pt's father educated on anticipated DC level of function after CIR stay with father reporting he and his wife can provide anticipated amount of assist (up to [place]), despite their own personal comorbidities. All in agreement for CIR when medically ready. AC will continue to follow for medical readiness. Please call for questions.   Jhonnie Garner, OTR/L  Rehab Admissions Coordinator  [phone] 09/05/2017 2:10 PM
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
## 2017-09-06 DIAGNOSIS — F411 Generalized anxiety disorder: Secondary | ICD-10-CM

## 2017-09-06 DIAGNOSIS — Z915 Personal history of self-harm: Secondary | ICD-10-CM

## 2017-09-06 DIAGNOSIS — F1290 Cannabis use, unspecified, uncomplicated: Secondary | ICD-10-CM

## 2017-09-06 DIAGNOSIS — F4323 Adjustment disorder with mixed anxiety and depressed mood: Secondary | ICD-10-CM

## 2017-09-06 DIAGNOSIS — M79606 Pain in leg, unspecified: Secondary | ICD-10-CM

## 2017-09-06 DIAGNOSIS — R102 Pelvic and perineal pain: Secondary | ICD-10-CM

## 2017-09-06 DIAGNOSIS — F1490 Cocaine use, unspecified, uncomplicated: Secondary | ICD-10-CM

## 2017-09-06 DIAGNOSIS — F17210 Nicotine dependence, cigarettes, uncomplicated: Secondary | ICD-10-CM

## 2017-09-06 DIAGNOSIS — R450 Nervousness: Secondary | ICD-10-CM

## 2017-09-06 DIAGNOSIS — F1110 Opioid abuse, uncomplicated: Secondary | ICD-10-CM

## 2017-09-06 DIAGNOSIS — F1390 Sedative, hypnotic, or anxiolytic use, unspecified, uncomplicated: Secondary | ICD-10-CM

## 2017-09-06 DIAGNOSIS — Z818 Family history of other mental and behavioral disorders: Secondary | ICD-10-CM

## 2017-09-06 DIAGNOSIS — Z8659 Personal history of other mental and behavioral disorders: Secondary | ICD-10-CM

## 2017-09-06 LAB — BASIC METABOLIC PANEL
Anion gap: 7 (ref 5–15)
BUN: 7 mg/dL (ref 6–20)
CHLORIDE: 106 mmol/L (ref 101–111)
CO2: 28 mmol/L (ref 22–32)
Calcium: 7.9 mg/dL — ABNORMAL LOW (ref 8.9–10.3)
Creatinine, Ser: 0.51 mg/dL (ref 0.44–1.00)
GFR calc Af Amer: >60 mL/min (ref >60)
GFR calc non Af Amer: >60 mL/min (ref >60)
GLUCOSE: 129 mg/dL — AB (ref 65–99)
POTASSIUM: 3.8 mmol/L (ref 3.5–5.1)
SODIUM: 141 mmol/L (ref 135–145)

## 2017-09-06 MED ORDER — BACITRACIN ZINC 500 UNIT/GM EX OINT
TOPICAL_OINTMENT | Freq: Two times a day (BID) | CUTANEOUS | Status: DC
  Administered 2017-09-06 – 2017-09-08 (×5): via TOPICAL
  Filled 2017-09-06: qty 28.35
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
## 2017-09-06 NOTE — Progress Notes (Signed)
Inpatient Rehabilitation-Admissions Coordinator   Reviewed chart, spoke with CM and will continue to follow for timing of therapy tolerance and medical readiness. Call if questions.   Nanine Means, OTR/L  Rehab Admissions Coordinator  [phone] 09/06/2017 3:33 PM
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
## 2017-09-07 ENCOUNTER — Inpatient Hospital Stay (HOSPITAL_COMMUNITY): Payer: Medicaid Other

## 2017-09-07 LAB — CREATININE, SERUM
Creatinine, Ser: 0.66 mg/dL (ref 0.44–1.00)
GFR calc non Af Amer: >60 mL/min (ref >60)

## 2017-09-07 MED ORDER — OXYCODONE HCL 5 MG PO TABS
15.0000 mg | ORAL_TABLET | ORAL | Status: DC | PRN
  Administered 2017-09-07 – 2017-09-08 (×7): 15 mg via ORAL
  Filled 2017-09-07 (×7): qty 3

## 2017-09-07 MED ORDER — HYDROMORPHONE HCL 2 MG/ML IJ SOLN
1.0000 mg | Freq: Once | INTRAMUSCULAR | Status: AC
  Administered 2017-09-07: 1 mg via INTRAVENOUS
  Filled 2017-09-07: qty 1

## 2017-09-07 MED ORDER — DIVALPROEX SODIUM 125 MG PO CSDR
250.0000 mg | DELAYED_RELEASE_CAPSULE | Freq: Two times a day (BID) | ORAL | Status: DC
  Administered 2017-09-07 – 2017-09-08 (×3): 250 mg via ORAL
  Filled 2017-09-07 (×3): qty 2

## 2017-09-07 MED ORDER — HYDROXYZINE HCL 25 MG PO TABS: 25.0000 mg | ORAL_TABLET | Freq: Three times a day (TID) | ORAL | Status: DC | PRN

## 2017-09-07 MED ORDER — HYDROMORPHONE HCL 2 MG/ML IJ SOLN
1.0000 mg | INTRAMUSCULAR | Status: DC | PRN
  Administered 2017-09-07 – 2017-09-08 (×5): 2 mg via INTRAVENOUS
  Filled 2017-09-07 (×5): qty 1

## 2017-09-07 NOTE — Progress Notes (Signed)
Inpatient Rehabilitation-Admissions Coordinator   Spoke with pt and her family bedside (parents and spouse present for conversation). Provided pt with benefits letter. Encouraged continued progress with therapy and AC to continue to follow up for medical readiness, transition off of IV pain meds prior to CIR admission. Call if questions.  Nanine Means, OTR/L  Rehab Admissions Coordinator  [phone] 09/07/2017 3:06 PM
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
## 2017-09-07 NOTE — Progress Notes (Signed)
Patient reports having a "bad" tooth on the right lower jaw.  Reports that it is causing her to have right ear pain.  PRN medication has relieved the pain at this time.  Encouraged the patient to tell her Doctor during next rounds.
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
## 2017-09-08 ENCOUNTER — Other Ambulatory Visit: Payer: Self-pay

## 2017-09-08 ENCOUNTER — Inpatient Hospital Stay (HOSPITAL_COMMUNITY)
Admission: RE | Admit: 2017-09-08 | Discharge: 2017-09-16 | DRG: 560 | Disposition: A | Discharge status: Home Health | Payer: Medicaid Other | Source: Intra-hospital | Attending: Physical Medicine & Rehabilitation | Admitting: Physical Medicine & Rehabilitation

## 2017-09-08 ENCOUNTER — Encounter (HOSPITAL_COMMUNITY): Payer: Self-pay | Admitting: *Deleted

## 2017-09-08 ENCOUNTER — Encounter: Payer: Self-pay | Admitting: Obstetrics

## 2017-09-08 DIAGNOSIS — Z880 Allergy status to penicillin: Secondary | ICD-10-CM

## 2017-09-08 DIAGNOSIS — S7222XE Displaced subtrochanteric fracture of left femur, subsequent encounter for open fracture type I or II with routine healing: Secondary | ICD-10-CM | POA: Diagnosis present

## 2017-09-08 DIAGNOSIS — S42002D Fracture of unspecified part of left clavicle, subsequent encounter for fracture with routine healing: Secondary | ICD-10-CM | POA: Diagnosis not present

## 2017-09-08 DIAGNOSIS — F1910 Other psychoactive substance abuse, uncomplicated: Secondary | ICD-10-CM | POA: Diagnosis present

## 2017-09-08 DIAGNOSIS — Z79899 Other long term (current) drug therapy: Secondary | ICD-10-CM | POA: Diagnosis not present

## 2017-09-08 DIAGNOSIS — K5900 Constipation, unspecified: Secondary | ICD-10-CM | POA: Diagnosis present

## 2017-09-08 DIAGNOSIS — Y92410 Unspecified street and highway as the place of occurrence of the external cause: Secondary | ICD-10-CM

## 2017-09-08 DIAGNOSIS — T07XXXA Unspecified multiple injuries, initial encounter: Secondary | ICD-10-CM | POA: Diagnosis not present

## 2017-09-08 DIAGNOSIS — S3729XD Other injury of bladder, subsequent encounter: Secondary | ICD-10-CM | POA: Diagnosis not present

## 2017-09-08 DIAGNOSIS — S0181XD Laceration without foreign body of other part of head, subsequent encounter: Secondary | ICD-10-CM | POA: Diagnosis not present

## 2017-09-08 DIAGNOSIS — F319 Bipolar disorder, unspecified: Secondary | ICD-10-CM | POA: Diagnosis present

## 2017-09-08 DIAGNOSIS — S82202E Unspecified fracture of shaft of left tibia, subsequent encounter for open fracture type I or II with routine healing: Secondary | ICD-10-CM | POA: Diagnosis not present

## 2017-09-08 DIAGNOSIS — M7989 Other specified soft tissue disorders: Secondary | ICD-10-CM | POA: Diagnosis not present

## 2017-09-08 DIAGNOSIS — E46 Unspecified protein-calorie malnutrition: Secondary | ICD-10-CM | POA: Diagnosis not present

## 2017-09-08 DIAGNOSIS — D62 Acute posthemorrhagic anemia: Secondary | ICD-10-CM | POA: Diagnosis present

## 2017-09-08 DIAGNOSIS — G894 Chronic pain syndrome: Secondary | ICD-10-CM

## 2017-09-08 DIAGNOSIS — S52591S Other fractures of lower end of right radius, sequela: Secondary | ICD-10-CM | POA: Diagnosis not present

## 2017-09-08 DIAGNOSIS — S7222XS Displaced subtrochanteric fracture of left femur, sequela: Secondary | ICD-10-CM

## 2017-09-08 DIAGNOSIS — S32591D Other specified fracture of right pubis, subsequent encounter for fracture with routine healing: Secondary | ICD-10-CM

## 2017-09-08 DIAGNOSIS — E8809 Other disorders of plasma-protein metabolism, not elsewhere classified: Secondary | ICD-10-CM | POA: Diagnosis not present

## 2017-09-08 DIAGNOSIS — S52501A Unspecified fracture of the lower end of right radius, initial encounter for closed fracture: Secondary | ICD-10-CM

## 2017-09-08 DIAGNOSIS — S32452D Displaced transverse fracture of left acetabulum, subsequent encounter for fracture with routine healing: Secondary | ICD-10-CM

## 2017-09-08 DIAGNOSIS — F17210 Nicotine dependence, cigarettes, uncomplicated: Secondary | ICD-10-CM | POA: Diagnosis present

## 2017-09-08 DIAGNOSIS — F310 Bipolar disorder, current episode hypomanic: Secondary | ICD-10-CM | POA: Diagnosis present

## 2017-09-08 DIAGNOSIS — G8918 Other acute postprocedural pain: Secondary | ICD-10-CM

## 2017-09-08 DIAGNOSIS — S52502S Unspecified fracture of the lower end of left radius, sequela: Secondary | ICD-10-CM

## 2017-09-08 DIAGNOSIS — S52501D Unspecified fracture of the lower end of right radius, subsequent encounter for closed fracture with routine healing: Secondary | ICD-10-CM

## 2017-09-08 DIAGNOSIS — S36039D Unspecified laceration of spleen, subsequent encounter: Secondary | ICD-10-CM | POA: Diagnosis not present

## 2017-09-08 DIAGNOSIS — S7290XA Unspecified fracture of unspecified femur, initial encounter for closed fracture: Secondary | ICD-10-CM | POA: Diagnosis present

## 2017-09-08 DIAGNOSIS — S3720XA Unspecified injury of bladder, initial encounter: Secondary | ICD-10-CM

## 2017-09-08 DIAGNOSIS — S32462S Displaced associated transverse-posterior fracture of left acetabulum, sequela: Secondary | ICD-10-CM

## 2017-09-08 DIAGNOSIS — Z8781 Personal history of (healed) traumatic fracture: Secondary | ICD-10-CM | POA: Diagnosis not present

## 2017-09-08 LAB — CBC
HCT: 35.8 % — ABNORMAL LOW (ref 36.0–46.0)
Hemoglobin: 10.9 g/dL — ABNORMAL LOW (ref 12.0–15.0)
MCH: 27.5 pg (ref 26.0–34.0)
MCHC: 30.4 g/dL (ref 30.0–36.0)
MCV: 90.2 fL (ref 78.0–100.0)
PLATELETS: 479 10*3/uL — AB (ref 150–400)
RBC: 3.97 MIL/uL (ref 3.87–5.11)
RDW: 13.8 % (ref 11.5–15.5)
WBC: 9.8 10*3/uL (ref 4.0–10.5)

## 2017-09-08 LAB — CREATININE, SERUM
CREATININE: 0.66 mg/dL (ref 0.44–1.00)
GFR calc Af Amer: >60 mL/min (ref >60)
GFR calc non Af Amer: >60 mL/min (ref >60)

## 2017-09-08 MED ORDER — HYDROMORPHONE HCL 2 MG/ML IJ SOLN: 1.0000 mg | Freq: Three times a day (TID) | INTRAMUSCULAR | Status: DC | PRN

## 2017-09-08 MED ORDER — BENZOCAINE 10 % MT GEL
Freq: Four times a day (QID) | OROMUCOSAL | Status: DC | PRN
  Administered 2017-09-08: 22:00:00 via OROMUCOSAL
  Filled 2017-09-08: qty 9

## 2017-09-08 MED ORDER — NAPHAZOLINE-GLYCERIN 0.012-0.2 % OP SOLN
1.0000 [drp] | Freq: Four times a day (QID) | OPHTHALMIC | Status: DC | PRN
Start: 2017-09-08 — End: 2017-09-16
  Filled 2017-09-08: qty 15

## 2017-09-08 MED ORDER — BACITRACIN ZINC 500 UNIT/GM EX OINT
TOPICAL_OINTMENT | Freq: Two times a day (BID) | CUTANEOUS | Status: DC
  Administered 2017-09-08 – 2017-09-16 (×10): via TOPICAL
  Filled 2017-09-08 (×4): qty 28.35

## 2017-09-08 MED ORDER — GABAPENTIN 300 MG PO CAPS
300.0000 mg | ORAL_CAPSULE | Freq: Three times a day (TID) | ORAL | Status: DC
  Administered 2017-09-08 – 2017-09-16 (×23): 300 mg via ORAL
  Filled 2017-09-08 (×23): qty 1

## 2017-09-08 MED ORDER — ONDANSETRON HCL 4 MG/2ML IJ SOLN: 4.0000 mg | Freq: Four times a day (QID) | INTRAMUSCULAR | Status: DC | PRN

## 2017-09-08 MED ORDER — TRAMADOL HCL 50 MG PO TABS
50.0000 mg | ORAL_TABLET | Freq: Four times a day (QID) | ORAL | Status: DC | PRN
  Filled 2017-09-08: qty 1

## 2017-09-08 MED ORDER — PANTOPRAZOLE SODIUM 40 MG PO TBEC
40.0000 mg | DELAYED_RELEASE_TABLET | Freq: Two times a day (BID) | ORAL | Status: DC
  Administered 2017-09-08 – 2017-09-16 (×16): 40 mg via ORAL
  Filled 2017-09-08 (×16): qty 1

## 2017-09-08 MED ORDER — OXYCODONE HCL 5 MG PO TABS
15.0000 mg | ORAL_TABLET | ORAL | Status: DC | PRN
  Administered 2017-09-08 – 2017-09-15 (×31): 15 mg via ORAL
  Filled 2017-09-08 (×34): qty 3

## 2017-09-08 MED ORDER — CHLORHEXIDINE GLUCONATE 0.12 % MT SOLN
15.0000 mL | Freq: Two times a day (BID) | OROMUCOSAL | Status: DC
  Administered 2017-09-08 – 2017-09-16 (×16): 15 mL via OROMUCOSAL
  Filled 2017-09-08 (×16): qty 15

## 2017-09-08 MED ORDER — MORPHINE SULFATE ER 15 MG PO TBCR
15.0000 mg | EXTENDED_RELEASE_TABLET | Freq: Two times a day (BID) | ORAL | Status: DC
  Administered 2017-09-08 – 2017-09-14 (×12): 15 mg via ORAL
  Filled 2017-09-08 (×12): qty 1

## 2017-09-08 MED ORDER — LORAZEPAM 1 MG PO TABS
1.0000 mg | ORAL_TABLET | Freq: Four times a day (QID) | ORAL | Status: DC | PRN
Start: 2017-09-08 — End: 2017-09-16
  Administered 2017-09-08 – 2017-09-16 (×6): 1 mg via ORAL
  Filled 2017-09-08 (×6): qty 1

## 2017-09-08 MED ORDER — ENOXAPARIN SODIUM 40 MG/0.4ML ~~LOC~~ SOLN
40.0000 mg | SUBCUTANEOUS | Status: DC
  Administered 2017-09-09 – 2017-09-16 (×8): 40 mg via SUBCUTANEOUS
  Filled 2017-09-08 (×8): qty 0.4

## 2017-09-08 MED ORDER — METHOCARBAMOL 750 MG PO TABS
750.0000 mg | ORAL_TABLET | Freq: Three times a day (TID) | ORAL | Status: DC
  Administered 2017-09-08 – 2017-09-16 (×23): 750 mg via ORAL
  Filled 2017-09-08 (×24): qty 1

## 2017-09-08 MED ORDER — DOCUSATE SODIUM 100 MG PO CAPS
100.0000 mg | ORAL_CAPSULE | Freq: Two times a day (BID) | ORAL | Status: DC
Start: 2017-09-08 — End: 2017-09-13
  Administered 2017-09-08 – 2017-09-13 (×10): 100 mg via ORAL
  Filled 2017-09-08 (×10): qty 1

## 2017-09-08 MED ORDER — HYDROXYZINE HCL 25 MG PO TABS
25.0000 mg | ORAL_TABLET | Freq: Three times a day (TID) | ORAL | Status: DC | PRN
  Administered 2017-09-08: 25 mg via ORAL
  Filled 2017-09-08: qty 1

## 2017-09-08 MED ORDER — DIVALPROEX SODIUM 125 MG PO CSDR
250.0000 mg | DELAYED_RELEASE_CAPSULE | Freq: Two times a day (BID) | ORAL | Status: DC
  Administered 2017-09-08 – 2017-09-16 (×16): 250 mg via ORAL
  Filled 2017-09-08 (×16): qty 2

## 2017-09-08 MED ORDER — ONDANSETRON 4 MG PO TBDP
4.0000 mg | ORAL_TABLET | Freq: Four times a day (QID) | ORAL | Status: DC | PRN
  Administered 2017-09-09 – 2017-09-10 (×3): 4 mg via ORAL
  Filled 2017-09-08 (×3): qty 1

## 2017-09-08 MED ORDER — ENSURE ENLIVE PO LIQD
237.0000 mL | Freq: Two times a day (BID) | ORAL | Status: DC
  Administered 2017-09-09 – 2017-09-15 (×8): 237 mL via ORAL

## 2017-09-08 MED ORDER — BISACODYL 10 MG RE SUPP
10.0000 mg | Freq: Every day | RECTAL | Status: DC | PRN
  Administered 2017-09-11: 10 mg via RECTAL
  Filled 2017-09-08: qty 1

## 2017-09-08 MED ORDER — HYDROMORPHONE HCL 2 MG PO TABS
4.0000 mg | ORAL_TABLET | Freq: Four times a day (QID) | ORAL | Status: DC | PRN
  Administered 2017-09-08 – 2017-09-12 (×12): 4 mg via ORAL
  Filled 2017-09-08 (×13): qty 2

## 2017-09-08 MED ORDER — POLYETHYLENE GLYCOL 3350 17 G PO PACK
17.0000 g | PACK | Freq: Every day | ORAL | Status: DC
  Administered 2017-09-10 – 2017-09-15 (×5): 17 g via ORAL
  Filled 2017-09-08 (×8): qty 1

## 2017-09-08 NOTE — Progress Notes (Signed)
Orthopedic Tech Progress Note Patient Details:  [name] Sep 05, 1980 [id number]  Ortho Devices Type of Ortho Device: Velcro wrist splint Ortho Device/Splint Location: RUE Ortho Device/Splint Interventions: Application, Ordered   Post Interventions Patient Tolerated: Well Instructions Provided: Care of device   Jennye Moccasin 09/08/2017, 5:13 PM
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
## 2017-09-08 NOTE — Plan of Care (Signed)
  Problem: Education: Goal: Knowledge of General Education information will improve Outcome: Progressing   Problem: Health Behavior/Discharge Planning: Goal: Ability to manage health-related needs will improve Outcome: Progressing   Problem: Clinical Measurements: Goal: Ability to maintain clinical measurements within normal limits will improve Outcome: Progressing Goal: Will remain free from infection Outcome: Progressing Goal: Diagnostic test results will improve Outcome: Progressing Goal: Cardiovascular complication will be avoided Outcome: Progressing   Problem: Activity: Goal: Risk for activity intolerance will decrease Outcome: Progressing   Problem: Nutrition: Goal: Adequate nutrition will be maintained Outcome: Progressing   Problem: Coping: Goal: Level of anxiety will decrease Outcome: Progressing   Problem: Elimination: Goal: Will not experience complications related to bowel motility Outcome: Progressing Goal: Will not experience complications related to urinary retention Outcome: Progressing   Problem: Pain Managment: Goal: General experience of comfort will improve Outcome: Progressing   Problem: Safety: Goal: Ability to remain free from injury will improve Outcome: Progressing   Problem: Skin Integrity: Goal: Risk for impaired skin integrity will decrease Outcome: Progressing   Problem: Education: Goal: Knowledge of the prescribed therapeutic regimen will improve Outcome: Progressing   Problem: Activity: Goal: Ability to increase mobility will improve Outcome: Progressing   Problem: Physical Regulation: Goal: Postoperative complications will be avoided or minimized Outcome: Progressing   Problem: Pain Management: Goal: Pain level will decrease with appropriate interventions Outcome: Progressing   Problem: Skin Integrity: Goal: Signs of wound healing will improve Outcome: Progressing   Problem: Education: Goal: Verbalization of  understanding the information provided (i.e., activity precautions, restrictions, etc) will improve Outcome: Progressing   Problem: Activity: Goal: Ability to ambulate and perform ADLs will improve Outcome: Progressing   Problem: Clinical Measurements: Goal: Postoperative complications will be avoided or minimized Outcome: Progressing   Problem: Self-Concept: Goal: Ability to maintain and perform role responsibilities to the fullest extent possible will improve Outcome: Progressing   Problem: Pain Management: Goal: Pain level will decrease Outcome: Progressing

## 2017-09-08 NOTE — Progress Notes (Signed)
Admit to unit, reviewed orders, rehab schedule medications and plan of care. States an understanding of information reviewed. [name]
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
## 2017-09-08 NOTE — Plan of Care (Signed)
Problem: Education: Goal: Knowledge of General Education information will improve 09/08/2017 1451 by Andy Gauss, RN Outcome: Adequate for Discharge 09/08/2017 1120 by Sahily Biddle, Arline Asp, RN Outcome: Progressing   Problem: Health Behavior/Discharge Planning: Goal: Ability to manage health-related needs will improve 09/08/2017 1451 by Nabor Thomann, Arline Asp, RN Outcome: Adequate for Discharge 09/08/2017 1120 by Shivali Quackenbush, Arline Asp, RN Outcome: Progressing   Problem: Clinical Measurements: Goal: Ability to maintain clinical measurements within normal limits will improve 09/08/2017 1451 by Andy Gauss, RN Outcome: Adequate for Discharge 09/08/2017 1120 by Andy Gauss, RN Outcome: Progressing Goal: Will remain free from infection 09/08/2017 1451 by Andy Gauss, RN Outcome: Adequate for Discharge 09/08/2017 1120 by Andy Gauss, RN Outcome: Progressing Goal: Diagnostic test results will improve 09/08/2017 1451 by Andy Gauss, RN Outcome: Adequate for Discharge 09/08/2017 1120 by Andy Gauss, RN Outcome: Progressing Goal: Cardiovascular complication will be avoided 09/08/2017 1451 by Andy Gauss, RN Outcome: Adequate for Discharge 09/08/2017 1120 by Andy Gauss, RN Outcome: Progressing   Problem: Activity: Goal: Risk for activity intolerance will decrease 09/08/2017 1451 by Andy Gauss, RN Outcome: Adequate for Discharge 09/08/2017 1120 by Andy Gauss, RN Outcome: Progressing   Problem: Nutrition: Goal: Adequate nutrition will be maintained 09/08/2017 1451 by Andy Gauss, RN Outcome: Adequate for Discharge 09/08/2017 1120 by Andy Gauss, RN Outcome: Progressing   Problem: Coping: Goal: Level of anxiety will decrease 09/08/2017 1451 by Andy Gauss, RN Outcome: Adequate for Discharge 09/08/2017 1120 by Andy Gauss,  RN Outcome: Progressing   Problem: Elimination: Goal: Will not experience complications related to bowel motility 09/08/2017 1451 by Andy Gauss, RN Outcome: Adequate for Discharge 09/08/2017 1120 by Andy Gauss, RN Outcome: Progressing Goal: Will not experience complications related to urinary retention 09/08/2017 1451 by Andy Gauss, RN Outcome: Adequate for Discharge 09/08/2017 1120 by Andy Gauss, RN Outcome: Progressing   Problem: Pain Managment: Goal: General experience of comfort will improve 09/08/2017 1451 by Andy Gauss, RN Outcome: Adequate for Discharge 09/08/2017 1120 by Andy Gauss, RN Outcome: Progressing   Problem: Safety: Goal: Ability to remain free from injury will improve 09/08/2017 1451 by Andy Gauss, RN Outcome: Adequate for Discharge 09/08/2017 1120 by Andy Gauss, RN Outcome: Progressing   Problem: Skin Integrity: Goal: Risk for impaired skin integrity will decrease 09/08/2017 1451 by Andy Gauss, RN Outcome: Adequate for Discharge 09/08/2017 1120 by Andy Gauss, RN Outcome: Progressing   Problem: Education: Goal: Knowledge of the prescribed therapeutic regimen will improve 09/08/2017 1451 by Andy Gauss, RN Outcome: Adequate for Discharge 09/08/2017 1120 by Andy Gauss, RN Outcome: Progressing   Problem: Activity: Goal: Ability to increase mobility will improve 09/08/2017 1451 by Merryn Thaker, Arline Asp, RN Outcome: Adequate for Discharge 09/08/2017 1120 by Kobe Ofallon, Arline Asp, RN Outcome: Progressing   Problem: Physical Regulation: Goal: Postoperative complications will be avoided or minimized 09/08/2017 1451 by Andy Gauss, RN Outcome: Adequate for Discharge 09/08/2017 1120 by Andy Gauss, RN Outcome: Progressing   Problem: Pain Management: Goal: Pain level will decrease with appropriate  interventions 09/08/2017 1451 by Andy Gauss, RN Outcome: Adequate for Discharge 09/08/2017 1120 by Andy Gauss, RN Outcome: Progressing   Problem: Skin Integrity: Goal: Signs of wound healing will improve 09/08/2017 1451 by Andy Gauss, RN Outcome: Adequate for Discharge 09/08/2017 1120 by Andy Gauss, RN Outcome: Progressing   Problem: Education: Goal: Verbalization of  understanding the information provided (i.e., activity precautions, restrictions, etc) will improve 09/08/2017 1451 by Shuan Statzer, Arline Asp, RN Outcome: Adequate for Discharge 09/08/2017 1120 by Andy Gauss, RN Outcome: Progressing   Problem: Activity: Goal: Ability to ambulate and perform ADLs will improve 09/08/2017 1451 by Malaisha Silliman, Arline Asp, RN Outcome: Adequate for Discharge 09/08/2017 1120 by Marino Rogerson, Arline Asp, RN Outcome: Progressing   Problem: Clinical Measurements: Goal: Postoperative complications will be avoided or minimized 09/08/2017 1451 by Andy Gauss, RN Outcome: Adequate for Discharge 09/08/2017 1120 by Andy Gauss, RN Outcome: Progressing   Problem: Self-Concept: Goal: Ability to maintain and perform role responsibilities to the fullest extent possible will improve 09/08/2017 1451 by Cristiano Capri, Arline Asp, RN Outcome: Adequate for Discharge 09/08/2017 1120 by Andy Gauss, RN Outcome: Progressing   Problem: Pain Management: Goal: Pain level will decrease 09/08/2017 1451 by Andy Gauss, RN Outcome: Adequate for Discharge 09/08/2017 1120 by Andy Gauss, RN Outcome: Progressing
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
## 2017-09-08 NOTE — Progress Notes (Signed)
Inpatient Rehabilitation-Admissions Coordinator   Pt to admit to CIR today. AC has communicated with Trauma service, CM, and floor nurse with pt and family excited for CIR. Bed available and will admit today. Please call if questions.   Nanine Means, OTR/L  Rehab Admissions Coordinator  [phone] 09/08/2017 1:06 PM

## 2017-09-09 ENCOUNTER — Inpatient Hospital Stay (HOSPITAL_COMMUNITY): Payer: Medicaid Other | Admitting: Occupational Therapy

## 2017-09-09 ENCOUNTER — Inpatient Hospital Stay (HOSPITAL_COMMUNITY): Payer: Medicaid Other | Admitting: *Deleted

## 2017-09-09 NOTE — Progress Notes (Signed)
Medium amount brownish drainage coming out from patient's left thigh mid area. An area of redness is noted, RN cleaned area and covered with abdominal pad dressing. MD notified. Will monitor.

## 2017-09-09 NOTE — Progress Notes (Signed)
?   Foley leaking or tube kinked. Will pass on to oncoming shift to monitor. [name]
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
## 2017-09-09 NOTE — Progress Notes (Signed)
All sutures removed from face except one suture above lip. Unable to pull loose from skin. Will pass on to be assessed by MD.

## 2017-09-09 NOTE — Plan of Care (Signed)
LTGs established 09/09/17 

## 2017-09-09 NOTE — Progress Notes (Signed)
At HS, complained of tooth pain, poor dental hygiene. Pam PA gave order for orajel.  Foley patent, reports ? Bladder spasms at times. LBM 05/14. PRN oxy IR given at 1902 & 2317. Refused PRN ultram, reports allergic to med, updated allergy profile. Husband left at 1945, patient anxious after he left, PRN ativan given with "some" relief. At 2145, tearful, waiting on husband, PRN atarax given at 2145. Rested quietly from MN until 0320, PRN Dilaudid given per patient's request. Currently asleep. Husband at bedside. Alfredo Martinez A
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
## 2017-09-10 ENCOUNTER — Inpatient Hospital Stay (HOSPITAL_COMMUNITY): Payer: Medicaid Other | Admitting: Occupational Therapy

## 2017-09-10 ENCOUNTER — Inpatient Hospital Stay (HOSPITAL_COMMUNITY): Payer: Medicaid Other

## 2017-09-10 DIAGNOSIS — M7989 Other specified soft tissue disorders: Secondary | ICD-10-CM

## 2017-09-10 DIAGNOSIS — S52501A Unspecified fracture of the lower end of right radius, initial encounter for closed fracture: Secondary | ICD-10-CM

## 2017-09-10 MED ORDER — SORBITOL 70 % SOLN
30.0000 mL | Freq: Every day | Status: DC | PRN
  Administered 2017-09-10: 30 mL via ORAL
  Filled 2017-09-10: qty 30
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
## 2017-09-10 NOTE — Progress Notes (Signed)
Small amount of brownish drainage noted this afternoon from lt thigh mid incision. Small area of redness/swelling with dot size area that is draining. New dry dressing applied.

## 2017-09-10 NOTE — Plan of Care (Signed)
  Problem: RH BOWEL ELIMINATION Goal: RH STG MANAGE BOWEL WITH ASSISTANCE Description STG Manage Bowel with min Assistance.  09/10/2017 1130 by Yolonda Kida, RN Outcome: Not Progressing 09/10/2017 1129 by Yolonda Kida, RN Outcome: Not Progressing Goal: RH STG MANAGE BOWEL W/MEDICATION W/ASSISTANCE Description STG Manage Bowel with Medication with min Assistance.  09/10/2017 1130 by Yolonda Kida, RN Outcome: Not Progressing 09/10/2017 1129 by Yolonda Kida, RN Outcome: Not Progressing

## 2017-09-10 NOTE — Progress Notes (Signed)
Occupational Therapy Session Note  Patient Details  Name: [name] MRN: [id number] Date of Birth: 1980-06-15  Today's Date: 09/10/2017 OT Individual Time: 0905-1000 OT Individual Time Calculation (min): 55 min   Short Term Goals: Week 1:  OT Short Term Goal 1 (Week 1): Pt will complete BSC transfer with 1 assist and LRAD OT Short Term Goal 2 (Week 1): Pt will complete LB dressing with Mod A and AE PRN OT Short Term Goal 3 (Week 1): Pt will complete TTB transfer with 1 assist and LRAD  Skilled Therapeutic Interventions/Progress Updates:    Pt greeted supine in bed with spouse Tinnie Gens present. Wanting to sit on toilet to have a BM and had just drank her Miralax. Mod A for supine<sit and Mod A lifting assist for stand pivot to [facility] (while Tinnie Gens held L LE for NWB). Continued education regarding positioning strategies for facilitate BM. When she was unable to void, proceeded with bathing/dressing tasks from [facility] in case urge came. Pt still with increased pain when moving L LE, and also c/o point tenderness/pulling on Rt forefoot. Max A for LB self care, +2 for elevating pants over hips in standing with L LE extended. Pt requiring min vcs for R UE NWB during sit<stands. At end of tx pt opted to return to bed in manner as written above. Educated her of benefits of sitting up in w/c for increasing OOB tolerance with pt declining due to pain. She was repositioned for comfort with ice applied to pelvis and L LE. Pt left with spouse at session exit, notified RN about need for pain medicine.   Therapy Documentation Precautions:  Precautions Precautions: Fall Required Braces or Orthoses: Other Brace/Splint Other Brace/Splint: CAM boot L LE when mobilizing OOB Restrictions Weight Bearing Restrictions: Yes RUE Weight Bearing: Weight bear through elbow only RUE Partial Weight Bearing Percentage or Pounds: WBAT above the elbow LUE Weight Bearing: Weight bearing as tolerated RLE Weight Bearing: Weight  bearing as tolerated LLE Weight Bearing: Non weight bearing(cam boot in place) Pain: Pain Assessment Pain Scale: 0-10 Pain Score: Asleep Pain Type: Acute pain;Surgical pain Pain Location: Leg Pain Orientation: Left Pain Descriptors / Indicators: Aching Pain Onset: Gradual Pain Intervention(s): Medication (See eMAR) ADL: ADL ADL Comments: Please see functional navigator for ADL status     See Function Navigator for Current Functional Status.   Therapy/Group: Individual Therapy  Jnae Thomaston A Payden Docter 09/10/2017, 12:38 PM

## 2017-09-10 NOTE — Progress Notes (Signed)
VASCULAR LAB PRELIMINARY  PRELIMINARY  PRELIMINARY  PRELIMINARY  Bilateral lower extremity venous duplex completed.    Preliminary report:  There is no DVT or SVT noted in the bilateral lower extremities.   Shamaya Kauer, RVT 09/10/2017, 12:21 PM

## 2017-09-11 ENCOUNTER — Inpatient Hospital Stay (HOSPITAL_COMMUNITY): Payer: Medicaid Other | Admitting: Occupational Therapy

## 2017-09-11 ENCOUNTER — Inpatient Hospital Stay (HOSPITAL_COMMUNITY): Payer: Medicaid Other | Admitting: Physical Therapy

## 2017-09-11 DIAGNOSIS — S52591S Other fractures of lower end of right radius, sequela: Secondary | ICD-10-CM

## 2017-09-11 LAB — CBC WITH DIFFERENTIAL/PLATELET
ABS IMMATURE GRANULOCYTES: 0.1 10*3/uL (ref 0.0–0.1)
Basophils Absolute: 0 10*3/uL (ref 0.0–0.1)
Basophils Relative: 1 %
EOS PCT: 2 %
Eosinophils Absolute: 0.1 10*3/uL (ref 0.0–0.7)
HCT: 31.8 % — ABNORMAL LOW (ref 36.0–46.0)
HEMOGLOBIN: 9.8 g/dL — AB (ref 12.0–15.0)
IMMATURE GRANULOCYTES: 1 %
LYMPHS PCT: 28 %
Lymphs Abs: 1.9 10*3/uL (ref 0.7–4.0)
MCH: 27.8 pg (ref 26.0–34.0)
MCHC: 30.8 g/dL (ref 30.0–36.0)
MCV: 90.3 fL (ref 78.0–100.0)
Monocytes Absolute: 0.9 10*3/uL (ref 0.1–1.0)
Monocytes Relative: 13 %
NEUTROS PCT: 55 %
Neutro Abs: 3.7 10*3/uL (ref 1.7–7.7)
Platelets: 483 10*3/uL — ABNORMAL HIGH (ref 150–400)
RBC: 3.52 MIL/uL — ABNORMAL LOW (ref 3.87–5.11)
RDW: 13.6 % (ref 11.5–15.5)
WBC: 6.7 10*3/uL (ref 4.0–10.5)

## 2017-09-11 LAB — COMPREHENSIVE METABOLIC PANEL
ALK PHOS: 73 U/L (ref 38–126)
ALT: 59 U/L — AB (ref 14–54)
AST: 55 U/L — AB (ref 15–41)
Albumin: 2.5 g/dL — ABNORMAL LOW (ref 3.5–5.0)
Anion gap: 10 (ref 5–15)
BUN: 18 mg/dL (ref 6–20)
CALCIUM: 8.6 mg/dL — AB (ref 8.9–10.3)
CO2: 32 mmol/L (ref 22–32)
CREATININE: 0.65 mg/dL (ref 0.44–1.00)
Chloride: 96 mmol/L — ABNORMAL LOW (ref 101–111)
GFR calc non Af Amer: >60 mL/min (ref >60)
Glucose, Bld: 98 mg/dL (ref 65–99)
Potassium: 4.2 mmol/L (ref 3.5–5.1)
Sodium: 138 mmol/L (ref 135–145)
Total Bilirubin: 0.9 mg/dL (ref 0.3–1.2)
Total Protein: 6.3 g/dL — ABNORMAL LOW (ref 6.5–8.1)

## 2017-09-11 NOTE — Progress Notes (Signed)
Occupational Therapy Session Note  Patient Details  Name: [name] MRN: [id number] Date of Birth: 03/12/1981  Today's Date: 09/11/2017 OT Individual Time: 1130-1202 and 1450-1540 OT Individual Time Calculation (min): 32 min and 50 min  Short Term Goals: Week 1:  OT Short Term Goal 1 (Week 1): Pt will complete BSC transfer with 1 assist and LRAD OT Short Term Goal 2 (Week 1): Pt will complete LB dressing with Mod A and AE PRN OT Short Term Goal 3 (Week 1): Pt will complete TTB transfer with 1 assist and LRAD  Skilled Therapeutic Interventions/Progress Updates:    Pt greeted supine in bed with spouse present. Pain under control. Retrieved drop arm BSC for room and started session with functional transfer practice, using PFRW and also lateral scooting. Pt requiring Mod A for both transfers, however reported increased ease with stand pivot and PFRW. Pt able to maintain L LE NWB though she reports this is challenging given weight of boot. Educated pt on method for laterally leaning onto bed in order to complete perihygiene (she reported this was difficult in AM on standard BSC). After she transferred to w/c, OT washed her hair for enhancing affect. At end of session pt was left with family to eat lunch.   2nd Session 1:1 tx (50 minutes) Pt greeted supine in bed, receiving pain medication from RN. Amenable to tx and requesting to shave her underarms/R LE. Tx focus on AROM R UE, activity tolerance, and ADL retraining. Pt able to shave R LE down to ankle with setup, Min A for thoroughness Lt axilla due to R UE ROM limitations. Guided pt through gentle AROM exercises for Rt forearm and wrist. Pt very limited with supination and mobility of wrist. Educated pt on importance of daily AROM completion to preserve and improve functional use. She verbalized understanding. Pt also able to don Rt gripper sock with use of reacher! At end of session pt was repositioned for comfort with ice applied to pelvis and L  LE. Educated her on use of music therapeutically for holistic pain mgt strategy. She was left with all needs and mother present.   Therapy Documentation Precautions:  Precautions Precautions: Fall Required Braces or Orthoses: Other Brace/Splint Other Brace/Splint: CAM boot L LE when mobilizing OOB Restrictions Weight Bearing Restrictions: Yes RUE Weight Bearing: Weight bear through elbow only RUE Partial Weight Bearing Percentage or Pounds: WBAT above the elbow LUE Weight Bearing: Weight bearing as tolerated RLE Weight Bearing: Weight bearing as tolerated LLE Weight Bearing: Non weight bearing(cam boot in place) Pain: Pain Assessment Pain Scale: 0-10 Pain Score: 10-Worst pain ever Pain Type: Acute pain Pain Location: Pelvis Pain Descriptors / Indicators: Aching Pain Frequency: Intermittent Pain Onset: Gradual Patients Stated Pain Goal: 4 Pain Intervention(s): Medication (See eMAR) ADL: ADL ADL Comments: Please see functional navigator for ADL status     See Function Navigator for Current Functional Status.  Therapy/Group: Individual Therapy  Saleem Coccia A Amethyst Gainer 09/11/2017, 12:52 PM

## 2017-09-11 NOTE — Progress Notes (Signed)
Orthopedic Tech Progress Note Patient Details:  [name] 04/14/1981 [id number] Replacement boot Ortho Devices Type of Ortho Device: CAM walker Ortho Device/Splint Location: LLE Ortho Device/Splint Interventions: Ordered, Application   Post Interventions Patient Tolerated: Well Instructions Provided: Care of device   Jennye Moccasin 09/11/2017, 4:06 PM

## 2017-09-11 NOTE — Progress Notes (Signed)
Physical Therapy Note  Patient Details  Name: Roberta Angell MRN: [id number] Date of Birth: [birth year] Today's Date: 09/11/2017    Time: [phone] 56 minutes  1:1 Pt c/o pain in pelvis, pain meds due after session, ice applied after session.  Pt performs bed mobility with min A for Lt LE.  Lateral scoot transfers throughout session with supervision, increased time and cues for NWB on Lt LE.  Sit to stand repetitions with PFRW with pt able to perform consistently with min A.  Stand pivot transfer training blocked practice with pt performing with min A/close supervision.  Pt mod A for LEs into bed and for positioning in bed due to pelvic pain.  Pt/husband educated on recommendation for ramp for home entry, both verbalize agreement.  Pt left in bed with needs at hand, husband present, ice on pelvis.   Frankie Scipio 09/11/2017, 10:28 AM

## 2017-09-11 NOTE — Progress Notes (Signed)
Occupational Therapy Session Note  Patient Details  Name: [name] MRN: [id number] Date of Birth: [birth year]  Today's Date: 09/11/2017 OT Individual Time: 0702-0800 OT Individual Time Calculation (min): 58 min    Short Term Goals: Week 1:  OT Short Term Goal 1 (Week 1): Pt will complete BSC transfer with 1 assist and LRAD OT Short Term Goal 2 (Week 1): Pt will complete LB dressing with Mod A and AE PRN OT Short Term Goal 3 (Week 1): Pt will complete TTB transfer with 1 assist and LRAD  Skilled Therapeutic Interventions/Progress Updates:    Upon entering the room, RN and husband present and pt seated on BSC attempting BM. Pt set up for bathing while seated on BSC secondary to time management. Pt with increased pain throughout session with RN notified. Pt also stimulating self for BM secondary to constipation. Pt having success with increased time this session. Pt performed hygiene from seated position and transferred onto bed with max stand pivot transfer. Mod A to place B LE's onto bed for sit >supine. Pt utilizing reacher for LB dressing and able to get over R foot this session. Min A rolling L <> R to pull pants over B hips. Pt remaining supine at end of session secondary to fatigue. Call bell and all needed items within reach upon exiting the room.   Therapy Documentation Precautions:  Precautions Precautions: Fall Required Braces or Orthoses: Other Brace/Splint Other Brace/Splint: CAM boot L LE when mobilizing OOB Restrictions Weight Bearing Restrictions: Yes RUE Weight Bearing: Weight bear through elbow only RUE Partial Weight Bearing Percentage or Pounds: WBAT above the elbow LUE Weight Bearing: Weight bearing as tolerated RLE Weight Bearing: Weight bearing as tolerated LLE Weight Bearing: Non weight bearing(cam boot in place) Vital Signs: Therapy Vitals Temp: 98.4 F (36.9 C) Temp Source: Oral Pulse Rate: 100 Resp: 18 BP: 97/62 Patient Position (if appropriate):  Lying Oxygen Therapy SpO2: 100 % O2 Device: Room Air Pain: Pain Assessment Pain Scale: 0-10 Pain Score: 8  Pain Type: Acute pain Pain Location: Pelvis Pain Descriptors / Indicators: Aching Pain Frequency: Intermittent Pain Onset: Gradual Patients Stated Pain Goal: 4 Pain Intervention(s): Medication (See eMAR) ADL: ADL ADL Comments: Please see functional navigator for ADL status  See Function Navigator for Current Functional Status.   Therapy/Group: Individual Therapy  Alen Bleacher 09/11/2017, 3:11 PM
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
## 2017-09-12 ENCOUNTER — Inpatient Hospital Stay (HOSPITAL_COMMUNITY): Payer: Medicaid Other

## 2017-09-12 ENCOUNTER — Inpatient Hospital Stay (HOSPITAL_COMMUNITY): Payer: Medicaid Other | Admitting: Physical Therapy

## 2017-09-12 ENCOUNTER — Encounter (HOSPITAL_COMMUNITY): Payer: Medicaid Other | Admitting: Psychology

## 2017-09-12 ENCOUNTER — Inpatient Hospital Stay (HOSPITAL_COMMUNITY): Payer: Medicaid Other | Admitting: Occupational Therapy

## 2017-09-12 DIAGNOSIS — F310 Bipolar disorder, current episode hypomanic: Secondary | ICD-10-CM | POA: Diagnosis present

## 2017-09-12 MED ORDER — IOTHALAMATE MEGLUMINE 17.2 % UR SOLN
250.0000 mL | Freq: Once | URETHRAL | Status: AC | PRN
  Administered 2017-09-12: 300 mL via INTRAVESICAL

## 2017-09-12 NOTE — Progress Notes (Signed)
Physical Therapy Session Note  Patient Details  Name: [name] MRN: [id number] Date of Birth: Jun 27, 1980  Today's Date: 09/12/2017 PT Individual Time: 1600-1657 PT Individual Time Calculation (min): 57 min   Short Term Goals: Week 1:  PT Short Term Goal 1 (Week 1): Pt will perform bed mobility with Mod A of 1 PT Short Term Goal 2 (Week 1): Pt will perform bed<>chair transfer with Mod A of 1 PT Short Term Goal 3 (Week 1): Pt will propell W x100' with S PT Short Term Goal 4 (Week 1): Pt will initiate gait training  Skilled Therapeutic Interventions/Progress Updates:    Pt supine in bed upon PT arrival, agreeable to therapy tx and reports pain 9/10 in L shin and pelvic region. Pt transferred from supine>sitting with min assist for L LE management. Pt performed sit<>stand with platform RW and min assist, stand pivot min assist to w/c. Pt propelled w/c from room>gym using L UE and R LE with supervision and verbal cues for techniques/steering. Pt seated in w/c performed LE therex for strength/ROM 2 x 10 each: assisted LAQ on L, LAQ with R LE, assisted SLR with R LE, hip flexion with R LE, passive knee flexion/extension ROM performed by therapist. Pt propelled w/c back to room with supervision and transferred from w/c>bed stand pivot with min assist and platform RW. Pt transferred to supine with min assist to lift L LE. Pt left supine in bed with ice applied for pain relief, needs in reach.   Therapy Documentation Precautions:  Precautions Precautions: Fall Required Braces or Orthoses: Other Brace/Splint Other Brace/Splint: CAM boot L LE when mobilizing OOB Restrictions Weight Bearing Restrictions: Yes RUE Weight Bearing: Weight bear through elbow only RUE Partial Weight Bearing Percentage or Pounds: WBAT above the elbow LUE Weight Bearing: Weight bearing as tolerated RLE Weight Bearing: Weight bearing as tolerated LLE Weight Bearing: Non weight bearing   See Function Navigator for  Current Functional Status.   Therapy/Group: Individual Therapy  Cresenciano Genre, PT, DPT 09/12/2017, 1:00 PM

## 2017-09-12 NOTE — Progress Notes (Signed)
flouroscopic cystogram ordered.   Will f/u. If bladder has healed, can remove foley in the morning.

## 2017-09-12 NOTE — Progress Notes (Signed)
Occupational Therapy Session Note  Patient Details  Name: [name] MRN: [id number] Date of Birth: Jun 23, 1980  Today's Date: 09/12/2017 OT Individual Time: 4098-1191 OT Individual Time Calculation (min): 53 min  and Today's Date: 09/12/2017 OT Missed Time: 22 Minutes Missed Time Reason: Pain;Other (comment)(toileting)   Short Term Goals: Week 1:  OT Short Term Goal 1 (Week 1): Pt will complete BSC transfer with 1 assist and LRAD OT Short Term Goal 2 (Week 1): Pt will complete LB dressing with Mod A and AE PRN OT Short Term Goal 3 (Week 1): Pt will complete TTB transfer with 1 assist and LRAD  Skilled Therapeutic Interventions/Progress Updates:    Upon entering the room, pt supine in bed and reports recently receiving medication for pain and feels "pretty good". Pt performed bed mobility to EOB with min A for L LE. Hands on family education with husband to transfer pt onto [facility]. Husband provided min A for stand pivot transfer from bed > BSC with use of PFRW. Caregiver checked off for transfers. OT elevating L LE while seated on toilet for pain management. Pt becoming nauseated, sweating, and straining with the effort of attempting BM. RN notified of pt's discomfort. OT exited the room as pt wished to continue attempting BM with possible suppository with RN. Caregiver remaining present in room. Call bell and all needed items within reach upon exiting the room.    Therapy Documentation Precautions:  Precautions Precautions: Fall Required Braces or Orthoses: Other Brace/Splint Other Brace/Splint: CAM boot L LE when mobilizing OOB Restrictions Weight Bearing Restrictions: Yes RUE Weight Bearing: Weight bear through elbow only RUE Partial Weight Bearing Percentage or Pounds: WBAT above the elbow LUE Weight Bearing: Weight bearing as tolerated RLE Weight Bearing: Weight bearing as tolerated LLE Weight Bearing: Non weight bearing General: General OT Amount of Missed Time: 22 Minutes Vital  Signs: Therapy Vitals Temp: 98 F (36.7 C) Temp Source: Oral Pulse Rate: 91 Resp: 18 BP: (!) 95/50 Patient Position (if appropriate): Lying Oxygen Therapy SpO2: (!) 84 % O2 Device: Room Air Pain: Pain Assessment Pain Scale: 0-10 Pain Score: 10-Worst pain ever Pain Type: Acute pain Pain Location: Knee Pain Orientation: Left Pain Descriptors / Indicators: Aching Pain Frequency: Constant Pain Onset: On-going Pain Intervention(s): Medication (See eMAR) ADL: ADL ADL Comments: Please see functional navigator for ADL status  See Function Navigator for Current Functional Status.   Therapy/Group: Individual Therapy  Alen Bleacher 09/12/2017, 9:17 AM

## 2017-09-12 NOTE — Progress Notes (Signed)
Fluoroscopic cystogram was reviewed.  Bladder appears to have healed.  I put in an order for the catheter to be removed at 3 in the morning tomorrow.  Please call if any problems arise.

## 2017-09-12 NOTE — Care Management (Signed)
Inpatient Rehabilitation Center Individual Statement of Services  Patient Name:  Jazmarie Biever  Date:  09/12/2017  Welcome to the Inpatient Rehabilitation [facility].  Our goal is to provide you with an individualized program based on your diagnosis and situation, designed to meet your specific needs.  With this comprehensive rehabilitation program, you will be expected to participate in at least 3 hours of rehabilitation therapies Monday-Friday, with modified therapy programming on the weekends.  Your rehabilitation program will include the following services:  Physical Therapy (PT), Occupational Therapy (OT), 24 hour per day rehabilitation nursing, Therapeutic Recreaction (TR), Neuropsychology, Case Management (Social Worker), Rehabilitation Medicine, Nutrition Services and Pharmacy Services  Weekly team conferences will be held on Tuesdays to discuss your progress.  Your Social Worker will talk with you frequently to get your input and to update you on team discussions.  Team conferences with you and your family in attendance may also be held.  Expected length of stay: 10-14 days    Overall anticipated outcome: supervision - minimal assistance  Depending on your progress and recovery, your program may change. Your Social Worker will coordinate services and will keep you informed of any changes. Your Social Worker's name and contact numbers are listed  below.  The following services may also be recommended but are not provided by the Inpatient Rehabilitation Center:   Driving Evaluations  Home Health Rehabiltiation Services  Outpatient Rehabilitation Services  Vocational Rehabilitation   Arrangements will be made to provide these services after discharge if needed.  Arrangements include referral to agencies that provide these services.  Your insurance has been verified to be:  Medicaid Your primary doctor is:  Research officer, trade union  Pertinent information will be shared with your doctor and your  insurance company.  Social Worker:  [place], [place] [phone] or [phone]   Information discussed with and copy given to patient by: Amada Jupiter, 09/12/2017, 4:09 PM

## 2017-09-12 NOTE — Progress Notes (Signed)
Physical Therapy Note  Patient Details  Name: Tylyn Stankovich MRN: [id number] Date of Birth: 1980/10/17 Today's Date: 09/12/2017    Time: [id number] 30 minutes  1:1 Pt c/o 9/10 pain due to just using BSC x 60 minutes.  Pt in bed and agreeable to bed level therex. Pt given HEP and performed quad sets, heel slides, glute sets, SAQ and SLR to tolerance. Pt and husband educated on location of fracture and mm attachments to explain pain/difficulty with some movements. Pt's husband and PT set up family education with pt's parents for Thursday. Pt left in bed with needs at hand, RN aware of pain.   Marquise Wicke 09/12/2017, 10:56 AM
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
## 2017-09-13 ENCOUNTER — Inpatient Hospital Stay (HOSPITAL_COMMUNITY): Payer: Medicaid Other | Admitting: Physical Therapy

## 2017-09-13 ENCOUNTER — Inpatient Hospital Stay (HOSPITAL_COMMUNITY): Payer: Medicaid Other | Admitting: Occupational Therapy

## 2017-09-13 MED ORDER — SENNOSIDES-DOCUSATE SODIUM 8.6-50 MG PO TABS
1.0000 | ORAL_TABLET | Freq: Two times a day (BID) | ORAL | Status: DC
  Administered 2017-09-13 – 2017-09-16 (×7): 1 via ORAL
  Filled 2017-09-13 (×6): qty 1

## 2017-09-13 NOTE — Progress Notes (Signed)
Physical Therapy Session Note  Patient Details  Name: [name] MRN: [id number] Date of Birth: July 03, 1980  Today's Date: 09/13/2017 PT Individual Time: 1510-1605 PT Individual Time Calculation (min): 55 min   Short Term Goals: Week 1:  PT Short Term Goal 1 (Week 1): Pt will perform bed mobility with Mod A of 1 PT Short Term Goal 2 (Week 1): Pt will perform bed<>chair transfer with Mod A of 1 PT Short Term Goal 3 (Week 1): Pt will propell W x100' with S PT Short Term Goal 4 (Week 1): Pt will initiate gait training  Skilled Therapeutic Interventions/Progress Updates:    c/o pain in LEs and pelvis but does not rate, agreeable to bed level therapy.  Session focus on pt education for recovery, scar mobilization, positioning during mobility to reduce pain, and HEP.  Pt c/o skin breakdown on L shin from ACE bandage; after discussion with nurse, cleared to remove bandage and apply pink foam dressing to prevent further skin breakdown.  Discussed self directed care as far as dressings go and that pt could help pass new instructions along (I.e. Ace wraps d/c'd).  Pt and PT reviewed modifications to mobility/positioning to reduce pain.  Also reviewed HEP.  Pt completes 10-12 reps of AAROM/AROM quad sets, glute sets, heel slides, SLR, and SAQ.  Provided pt with handout on scar mobilization to reduce pain, hypertrophy, and improve scar tissue strength.  Pt remains in bed at end of session, positioned to comfort with call bell in reach and needs met.   Therapy Documentation Precautions:  Precautions Precautions: Fall Required Braces or Orthoses: Other Brace/Splint Other Brace/Splint: CAM boot L LE when mobilizing OOB Restrictions Weight Bearing Restrictions: Yes RUE Weight Bearing: Weight bearing as tolerated(Only WB thru elbow on right side) RUE Partial Weight Bearing Percentage or Pounds: WBAT above the elbow LUE Weight Bearing: Weight bearing as tolerated RLE Weight Bearing: Weight bearing as  tolerated LLE Weight Bearing: Non weight bearing(CAM boot)   See Function Navigator for Current Functional Status.   Therapy/Group: Individual Therapy  Michel Santee 09/13/2017, 4:33 PM

## 2017-09-13 NOTE — Progress Notes (Signed)
Physical Therapy Session Note  Patient Details  Name: [name] MRN: [id number] Date of Birth: [birth year]  Today's Date: 09/13/2017 PT Individual Time: [id number] make up PT Individual Time Calculation (min): 40 min   Short Term Goals: Week 1:  PT Short Term Goal 1 (Week 1): Pt will perform bed mobility with Mod A of 1 PT Short Term Goal 2 (Week 1): Pt will perform bed<>chair transfer with Mod A of 1 PT Short Term Goal 3 (Week 1): Pt will propell W x100' with S PT Short Term Goal 4 (Week 1): Pt will initiate gait training  Skilled Therapeutic Interventions/Progress Updates: Pt presented in w/c agreeable to therapy. C/O pain 7/10 in L ankle pt feels due to foot part of CAM boot being open earlier. Pt performed supine to sit with minA from SO using draw sheet. Pt performed stand pivot transfer with min guard from SO and PTA supervising. Session focused on endurance and available extremity strengthening with pt propelling throughout unit and to [facility] with intermittent breaks due to fatigue. Towards end of session pt indicated increased discomfort at pelvic area but not pain. Pt returned to room and agreeable to try to sit on BSC to attempt void due to catheter removal earlier this AM. Performed stand pivot with PFRW with min guard and SO stabilizing PFRW (+void). Performed stand pivot to return to bed (with SO stabilizing PFRW) and pt returned to supine with minA from SO. Pt left in bed with NT present to perform bladder scan.      Therapy Documentation Precautions:  Precautions Precautions: Fall Required Braces or Orthoses: Other Brace/Splint Other Brace/Splint: CAM boot L LE when mobilizing OOB Restrictions Weight Bearing Restrictions: Yes RUE Weight Bearing: Weight bear through elbow only RUE Partial Weight Bearing Percentage or Pounds: WBAT above the elbow LUE Weight Bearing: Weight bearing as tolerated RLE Weight Bearing: Weight bearing as tolerated LLE Weight  Bearing: Non weight bearing General:   Vital Signs:   Pain: Pain Assessment Pain Scale: 0-10 Pain Score: 8  Pain Type: Acute pain Pain Location: Ankle Pain Orientation: Left Pain Descriptors / Indicators: Aching Patients Stated Pain Goal: 6 Pain Intervention(s): Medication (See eMAR)  See Function Navigator for Current Functional Status.   Therapy/Group: Individual Therapy  Aashrith Eves 09/13/2017, 11:01 AM
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
## 2017-09-13 NOTE — Progress Notes (Signed)
Physical Therapy Session Note  Patient Details  Name: Chelby Salata MRN: [id number] Date of Birth: [birth year]  Today's Date: 09/13/2017 PT Individual Time: 1345-1430 PT Individual Time Calculation (min): 45 min   Short Term Goals: Week 1:  PT Short Term Goal 1 (Week 1): Pt will perform bed mobility with Mod A of 1 PT Short Term Goal 2 (Week 1): Pt will perform bed<>chair transfer with Mod A of 1 PT Short Term Goal 3 (Week 1): Pt will propell W x100' with S PT Short Term Goal 4 (Week 1): Pt will initiate gait training  Skilled Therapeutic Interventions/Progress Updates:    Pt supine in bed, agreeable to PT. Pt reports 6/10 pain in BLE and pelvic region, receives pain medication at beginning of therapy session. Bed mobility min A for LLE management. Pt is able to don R shoe and sock while seated EOB with setup assist. Stand pivot transfer bed to w/c with PFRW and min A for balance. Manual w/c propulsion 2 x 150 ft with Supervision. Sit to stand with min A to PFRW. Standing tolerance x 3 min, x 3.5 min with CGA for balance with PFRW while engaging in UE task with LUE. Pt relies heavily on bracing RLE on mat table and RUE WBing through elbow for standing balance and exhibits improved standing posture when able to use LUE on RW vs engaging in UE task. Assisted pt back to bed at end of session, min A for LLE management. Pt left supine in bed with needs in reach, family present.  Therapy Documentation Precautions:  Precautions Precautions: Fall Required Braces or Orthoses: Other Brace/Splint Other Brace/Splint: CAM boot L LE when mobilizing OOB Restrictions Weight Bearing Restrictions: (P) Yes RUE Weight Bearing: (P) Weight bearing as tolerated(Only WB thru elbow on right side) RUE Partial Weight Bearing Percentage or Pounds: WBAT above the elbow LUE Weight Bearing: (P) Weight bearing as tolerated RLE Weight Bearing: (P) Weight bearing as tolerated LLE Weight Bearing: (P) Non weight bearing(CAM  boot)  See Function Navigator for Current Functional Status.   Therapy/Group: Individual Therapy  Peter Congo, PT, DPT  09/13/2017, 3:31 PM

## 2017-09-13 NOTE — Progress Notes (Signed)
Occupational Therapy Note  Patient Details  Name: [name] MRN: [id number] Date of Birth: [birth year]  Today's Date: 09/13/2017 OT Individual Time: 1308-6578 and 1130-1158 OT Individual Time Calculation (min): 57 min and 28 min   Session 1: Pt supine with husband present in room and reporting need to urinate. Pt transferred with use of PFRW and min A to drop arm commode chair. Pt able to tolerate B LE's being placed on floor while voiding. Pt required assistance pull pants over B hips after toileting completed. While seated in wheelchair, pt at sink and performing grooming tasks with set up A. OT educated and demonstrated management of B wheelchair leg rests. Pt returning demonstration with increased time and min verbal cues for proper technique but completed task. Pt propelled wheelchair to ADL apartment to practice functional transfer onto low sofa, similar to home environment, with husband. Pt required lifting assistance to stand from low surface in order to maintain NWB on L LE. Pt returning to wheelchair with increased fatigue and pain. Pain medications given once returning to room. Pt returned to bed and ice applied . Call bell and all needed items within reach upon exiting the room.   Session 2: Upon entering the room, pt supine in bed with husband present. Pt continues to report increased pain in L LE and ankle. OT removing cam boot to inspect skin. OT placed additional foam piece across top of ankle and adjusted straps further. Pt reports, " That feels much better already". OT requesting wearing schedule from PA to see if it can be removed at night or between therapies. Pt remained in bed and head applied to L hamstrings after removing ice. Call bell and all needed items within reach upon exiting the room.    Alen Bleacher 09/13/2017, 12:41 PM
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
## 2017-09-14 ENCOUNTER — Inpatient Hospital Stay (HOSPITAL_COMMUNITY): Payer: Medicaid Other

## 2017-09-14 ENCOUNTER — Inpatient Hospital Stay (HOSPITAL_COMMUNITY): Payer: Medicaid Other | Admitting: Occupational Therapy

## 2017-09-14 ENCOUNTER — Inpatient Hospital Stay (HOSPITAL_COMMUNITY): Payer: Medicaid Other | Admitting: Physical Therapy

## 2017-09-14 DIAGNOSIS — Z8781 Personal history of (healed) traumatic fracture: Secondary | ICD-10-CM

## 2017-09-14 NOTE — Progress Notes (Signed)
Physical Therapy Session Note  Patient Details  Name: [name] MRN: [id number] Date of Birth: [birth year]  Today's Date: 09/14/2017 PT Individual Time: 4098-1191 PT Individual Time Calculation (min): 71 min   Short Term Goals: Week 1:  PT Short Term Goal 1 (Week 1): Pt will perform bed mobility with Mod A of 1 PT Short Term Goal 2 (Week 1): Pt will perform bed<>chair transfer with Mod A of 1 PT Short Term Goal 3 (Week 1): Pt will propell W x100' with S PT Short Term Goal 4 (Week 1): Pt will initiate gait training  Skilled Therapeutic Interventions/Progress Updates:    Session focused on reviewing education provided this morning and d/c planning while preparing for OOB (pt's mother assisted intermittently throughout session with transfers etc), functional transfers with PFRW including w/c <> bed and toilet transfers using BSC with overall supervision to steadying assist to stabilize RW (cues for L hand placement to push up from chair/surface to decrease pulling up on RW), dynamic standing balance during clothing management and pt performed hygiene, w/c propulsion with RLE and LUE for functional mobility training and overall strengthening/endurance, and instructed in BLE therex including supine AAROM heel slides, hip abduction/adduction, LAQ with 5 second hold (AAROM on L) and isometric hip adduction squeezes with 5 second hold all x 10 reps each. Education and positive reinforcement throughout session provided. Returned to bed end of session and RN aware of pain medication request.  Therapy Documentation Precautions:  Precautions Precautions: Fall Required Braces or Orthoses: Other Brace/Splint Other Brace/Splint: CAM boot L LE when mobilizing OOB Restrictions Weight Bearing Restrictions: Yes RUE Weight Bearing: Weight bearing as tolerated RUE Partial Weight Bearing Percentage or Pounds: WBAT above the elbow LUE Weight Bearing: Weight bearing as tolerated RLE Weight Bearing: Weight  bearing as tolerated LLE Weight Bearing: Non weight bearing(CAM boot)  Pain: C/o pain in pelvis and LLE as well as top of RLE - pain medication due at end of session. Ice packs applied at end of session.   See Function Navigator for Current Functional Status.   Therapy/Group: Individual Therapy  Karolee Stamps Darrol Poke, PT, DPT  09/14/2017, 2:18 PM
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
## 2017-09-14 NOTE — Progress Notes (Signed)
Occupational Therapy Session Note  Patient Details  Name: [name] MRN: [id number] Date of Birth: 06/21/1980  Today's Date: 09/14/2017 OT Individual Time: 1000-1054 OT Individual Time Calculation (min): 54 min    Short Term Goals: Week 1:  OT Short Term Goal 1 (Week 1): Pt will complete BSC transfer with 1 assist and LRAD OT Short Term Goal 2 (Week 1): Pt will complete LB dressing with Mod A and AE PRN OT Short Term Goal 3 (Week 1): Pt will complete TTB transfer with 1 assist and LRAD  Skilled Therapeutic Interventions/Progress Updates:    Family present in room for family education. Pt with increased pain 9/10 in L LE. RN notified and medication given. Pt agreeable to toilet transfer for practice with mother. Pt and husband verbally directing caregiver for transfer with appropriate technique demonstrated. Pt returned to bed after transfer secondary to pain and ice applied across pelvis and L knee for pain management. OT educated caregivers on home set up, safety with equipment, and pt's completion of ADLs from 3 in 1 commode chair. Caregivers verbalized understanding and asked appropriate questions. Pt remained in bed with all needs within reach.  Therapy Documentation Precautions:  Precautions Precautions: Fall Required Braces or Orthoses: Other Brace/Splint Other Brace/Splint: CAM boot L LE when mobilizing OOB Restrictions Weight Bearing Restrictions: Yes RUE Weight Bearing: Weight bearing as tolerated RUE Partial Weight Bearing Percentage or Pounds: WBAT above the elbow LUE Weight Bearing: Weight bearing as tolerated RLE Weight Bearing: Weight bearing as tolerated LLE Weight Bearing: Non weight bearing(CAM boot) General:   Vital Signs:   Pain: Pain Assessment Pain Scale: 0-10 Pain Score: 10-Worst pain ever Pain Type: Acute pain;Surgical pain Pain Location: Leg Pain Orientation: Left Pain Descriptors / Indicators: Aching Pain Frequency: Constant Pain Onset:  On-going Pain Intervention(s): Medication (See eMAR) ADL: ADL ADL Comments: Please see functional navigator for ADL status  See Function Navigator for Current Functional Status.   Therapy/Group: Individual Therapy  Alen Bleacher 09/14/2017, 12:59 PM

## 2017-09-14 NOTE — Progress Notes (Signed)
Sutures removed from LLE & right wrist without complications. Pt resting comfortably with family at the bedside.

## 2017-09-14 NOTE — Progress Notes (Signed)
Pt & pts mother expressed concern about pt having a bad tooth which they feel is getting worst. Stated " We are scared if the tooth is infected that it can spread to the left leg".  They would like to know if pt could benefit from antibiotics to prevent spread of infection.

## 2017-09-14 NOTE — Progress Notes (Signed)
Physical Therapy Note  Patient Details  Name: Keyasha Miah MRN: [id number] Date of Birth: [birth year] Today's Date: 09/14/2017    Time: 1100-1154 54 minutes  1:1 Pt c/o pain in pelvis with movement, pain meds given prior to session.  Session focused on pt/familiy education with pt's parents and spouse. Pt's parents and spouse all performed supervision and set up assist for stand pivot transfers from bed and w/c.  Pt's father and spouse performed real car transfer to truck with pt's father providing min A for getting LEs into truck. Pt's parents educated on w/c parts management and folding w/c to get it into car. Pt's husband and father observed and performed bumping w/c up step to simulated getting into home.  pts family states they feel comfortable with taking pt home and this level of care.  Pt left in bed with needs at hand, family present.   Kaymen Adrian 09/14/2017, 12:55 PM

## 2017-09-15 ENCOUNTER — Inpatient Hospital Stay (HOSPITAL_COMMUNITY): Payer: Medicaid Other | Admitting: Physical Therapy

## 2017-09-15 ENCOUNTER — Inpatient Hospital Stay (HOSPITAL_COMMUNITY): Payer: Medicaid Other

## 2017-09-15 DIAGNOSIS — T07XXXA Unspecified multiple injuries, initial encounter: Secondary | ICD-10-CM

## 2017-09-15 DIAGNOSIS — F310 Bipolar disorder, current episode hypomanic: Secondary | ICD-10-CM

## 2017-09-15 DIAGNOSIS — D62 Acute posthemorrhagic anemia: Secondary | ICD-10-CM

## 2017-09-15 DIAGNOSIS — E8809 Other disorders of plasma-protein metabolism, not elsewhere classified: Secondary | ICD-10-CM

## 2017-09-15 DIAGNOSIS — E46 Unspecified protein-calorie malnutrition: Secondary | ICD-10-CM

## 2017-09-15 DIAGNOSIS — G8918 Other acute postprocedural pain: Secondary | ICD-10-CM

## 2017-09-15 DIAGNOSIS — G894 Chronic pain syndrome: Secondary | ICD-10-CM

## 2017-09-15 DIAGNOSIS — L0390 Cellulitis, unspecified: Secondary | ICD-10-CM

## 2017-09-15 MED ORDER — PANTOPRAZOLE SODIUM 40 MG PO TBEC: 40.0000 mg | DELAYED_RELEASE_TABLET | Freq: Two times a day (BID) | ORAL | 0 refills | Status: DC

## 2017-09-15 MED ORDER — OXYCODONE HCL 5 MG PO TABS
15.0000 mg | ORAL_TABLET | Freq: Four times a day (QID) | ORAL | Status: DC | PRN
  Administered 2017-09-15 – 2017-09-16 (×5): 15 mg via ORAL
  Filled 2017-09-15 (×5): qty 3

## 2017-09-15 MED ORDER — PRO-STAT SUGAR FREE PO LIQD
30.0000 mL | Freq: Two times a day (BID) | ORAL | Status: DC
  Filled 2017-09-15 (×2): qty 30

## 2017-09-15 MED ORDER — GABAPENTIN 300 MG PO CAPS: 300.0000 mg | ORAL_CAPSULE | Freq: Three times a day (TID) | ORAL | 0 refills | Status: DC

## 2017-09-15 MED ORDER — SENNOSIDES-DOCUSATE SODIUM 8.6-50 MG PO TABS: 1.0000 | ORAL_TABLET | Freq: Two times a day (BID) | ORAL | Status: DC

## 2017-09-15 MED ORDER — HYDROXYZINE HCL 25 MG PO TABS: 25.0000 mg | ORAL_TABLET | Freq: Three times a day (TID) | ORAL | 0 refills | Status: DC | PRN

## 2017-09-15 MED ORDER — METHOCARBAMOL 750 MG PO TABS: 750.0000 mg | ORAL_TABLET | Freq: Three times a day (TID) | ORAL | 0 refills | Status: DC

## 2017-09-15 MED ORDER — OXYCODONE HCL 15 MG PO TABS: 15.0000 mg | ORAL_TABLET | Freq: Four times a day (QID) | ORAL | 0 refills | Status: DC | PRN

## 2017-09-15 MED ORDER — ENOXAPARIN SODIUM 40 MG/0.4ML ~~LOC~~ SOLN: SUBCUTANEOUS | 0 refills | Status: DC

## 2017-09-15 MED ORDER — DIVALPROEX SODIUM 125 MG PO CSDR: 250.0000 mg | DELAYED_RELEASE_CAPSULE | Freq: Two times a day (BID) | ORAL | 0 refills | Status: DC

## 2017-09-15 MED ORDER — SULFAMETHOXAZOLE-TRIMETHOPRIM 800-160 MG PO TABS
1.0000 | ORAL_TABLET | Freq: Two times a day (BID) | ORAL | Status: DC
  Administered 2017-09-15 – 2017-09-16 (×3): 1 via ORAL
  Filled 2017-09-15 (×3): qty 1

## 2017-09-15 MED ORDER — POLYETHYLENE GLYCOL 3350 17 G PO PACK: 17.0000 g | PACK | Freq: Every day | ORAL | 0 refills | Status: DC

## 2017-09-15 MED ORDER — ENOXAPARIN (LOVENOX) PATIENT EDUCATION KIT
PACK | Freq: Once | Status: AC
  Administered 2017-09-15: 18:00:00
  Filled 2017-09-15: qty 1

## 2017-09-15 NOTE — Progress Notes (Signed)
Physical Therapy Discharge Summary  Patient Details  Name: [name] MRN: [id number] Date of Birth: May 28, 1980  Today's Date: 09/15/2017 PT Individual Time: 1300-1340 PT Individual Time Calculation (min): 40 min    Patient has met 7 of 7 long term goals due to improved activity tolerance, improved balance, increased strength and ability to compensate for deficits.  Patient to discharge at a wheelchair level Supervision.   Patient's care partner is independent to provide the necessary physical assistance at discharge. Patient's family has performed transfers, toileting, car transfers, and stair training with patient and therapists and are comfortable assisting pt at home.  Reasons goals not met: Pt has met all rehab goals.  Recommendation:  Patient will benefit from ongoing skilled PT services in home health setting to continue to advance safe functional mobility, address ongoing impairments in strength, endurance, independence with transfers and bed mobility, standing tolerance, and minimize fall risk.  Equipment: platform RW, manual w/c with elevating leg rests  Reasons for discharge: treatment goals met and discharge from hospital  Patient/family agrees with progress made and goals achieved: Yes   Skilled Intervention: Pt supine in bed, agreeable to PT. Pt reports 9/10 pain in L knee and R pelvic region, pain medication from RN during therapy session and ice packs to painful areas at end of session. Bed mobility min A for LLE management. Stand pivot transfer bed to/from w/c with SBA. Manual w/c propulsion x 400 ft Mod I to retrieve ice packs. Seated LUE therex 3 x 10 reps with green theraband: bicep curls, tricep ext, scap squeezes, horiz add/abd. Pt left seated in bed with needs in reach.  PT Discharge Precautions/Restrictions Precautions Precautions: Fall Required Braces or Orthoses: Other Brace/Splint Other Brace/Splint: CAM boot L LE when mobilizing OOB Restrictions Weight  Bearing Restrictions: Yes RUE Weight Bearing: Weight bearing as tolerated RUE Partial Weight Bearing Percentage or Pounds: WBAT above the elbow LUE Weight Bearing: Weight bearing as tolerated RLE Weight Bearing: Weight bearing as tolerated LLE Weight Bearing: Non weight bearing Cognition Overall Cognitive Status: Within Functional Limits for tasks assessed Arousal/Alertness: Awake/alert Orientation Level: Oriented X4 Attention: Selective Sustained Attention: Appears intact Selective Attention: Appears intact Memory: Appears intact Awareness: Appears intact Problem Solving: Appears intact Sensation Sensation Light Touch: Appears Intact Hot/Cold: Appears Intact Proprioception: Appears Intact Motor  Motor Motor: Within Functional Limits  Mobility Transfers Sit to Stand: 5: Supervision Trunk/Postural Assessment  Cervical Assessment Cervical Assessment: Within Functional Limits Thoracic Assessment Thoracic Assessment: Within Functional Limits Lumbar Assessment Lumbar Assessment: Within Functional Limits Postural Control Postural Control: Within Functional Limits  Balance Balance Balance Assessed: Yes Static Sitting Balance Static Sitting - Balance Support: No upper extremity supported Static Sitting - Level of Assistance: 6: Modified independent (Device/Increase time) Dynamic Sitting Balance Dynamic Sitting - Level of Assistance: 5: Stand by assistance Dynamic Standing Balance Dynamic Standing - Balance Support: Bilateral upper extremity supported;During functional activity Dynamic Standing - Level of Assistance: 4: Min assist Extremity Assessment  RUE Assessment RUE Assessment: Exceptions to WFL(distal radius fx) LUE Assessment LUE Assessment: Exceptions to WFL(L clavicle fx)     See Function Navigator for Current Functional Status.  Excell Seltzer, PT, DPT  09/15/2017, 1:43 PM

## 2017-09-15 NOTE — Progress Notes (Signed)
Social Work Patient ID: [name], female   DOB: 08-13-1980, 37 y.o.   MRN: [id number]  Met with pt and family today review team conference.  Both aware and very happy with d/c 5/25 and plan for [facility] follow up.  DME reviewed. Family ed completed.  No concerns.  Teyanna Thielman, LCSW

## 2017-09-15 NOTE — Discharge Summary (Signed)
Discharge summary job [phone]

## 2017-09-15 NOTE — Progress Notes (Signed)
Physical Therapy Note  Patient Details  Name: [name] MRN: [id number] Date of Birth: December 24, 1980 Today's Date: 09/15/2017    Time: [id number] 42 minutes  1:1 Pt c/o Lt hip pain, ice applied and meds given prior to session.  Pt performs stand pivot transfers with PFRW throughout session with supervision.  W/c mobility throughout unit in home and controlled environments with mod I, pt able to manage w/c parts and talk PT how to manage w/c parts she is unable to reach.  Supine therex for LE strength and AAROM performed with bilat LEs heel slides, quad sets, SAQ, glute squeezes. Discussed energy conservation and community mobility issues with pt who expresses understanding. Pt left in bed with needs at hand.     Nellie Pester 09/15/2017, 10:14 AM
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
## 2017-09-15 NOTE — Progress Notes (Signed)
Occupational Therapy Discharge Summary  Patient Details  Name: [name] MRN: [id number] Date of Birth: 04/14/1981  1103-1200 57 min  1;1. Pt agreeable to OT session. Pt completes stand pivot transfer throughout session with supervision EOB>w/c>BSC and min guard w/c>TTB. Pt simulates toileting with supervision on BSC using lateral leans/standing with platform walker for clothing management. Pt simulates bathing as pt already bathed with help for only the LLE. Pt completes LB dressing with A to thread LLE into pants, pt able to thread RLE and advance pants past hips in standing with touching A. Pt completes UB dressing with set up. OT adjusts new w/c leg rests to fit to pt. Exited session with pt seated in w/c, call light in reach and all needs met   Patient has met 9 of 9 long term goals due to improved activity tolerance, improved balance, postural control, ability to compensate for deficits and improved coordination.  Patient to discharge at [facility] Assist level.  Patient's care partner is independent to provide the necessary physical assistance at discharge.    Reasons goals not met: n/a  Recommendation:  Patient will benefit from ongoing skilled OT services in home health setting to continue to advance functional skills in the area of BADL and iADL.  Equipment: w/c, plat form walker, BSC  Reasons for discharge: treatment goals met  Patient/family agrees with progress made and goals achieved: Yes  OT Discharge Precautions/Restrictions  Precautions Precautions: Fall Required Braces or Orthoses: Other Brace/Splint Other Brace/Splint: CAM boot L LE when mobilizing OOB Restrictions Weight Bearing Restrictions: Yes RUE Weight Bearing: Weight bearing as tolerated RUE Partial Weight Bearing Percentage or Pounds: WBAT above the elbow LUE Weight Bearing: Weight bearing as tolerated RLE Weight Bearing: Weight bearing as tolerated LLE Weight Bearing: Non weight bearing General    Vital Signs  Pain Pain Assessment Faces Pain Scale: Hurts a little bit ADL ADL ADL Comments: Please see functional navigator for ADL status Vision Baseline Vision/History: No visual deficits Patient Visual Report: No change from baseline Vision Assessment?: No apparent visual deficits Perception   WFL Praxis  WFL Cognition Overall Cognitive Status: Within Functional Limits for tasks assessed Arousal/Alertness: Awake/alert Orientation Level: Oriented X4 Attention: Selective Sustained Attention: Appears intact Selective Attention: Appears intact Memory: Appears intact Awareness: Appears intact Problem Solving: Appears intact Sensation Sensation Light Touch: Appears Intact Hot/Cold: Appears Intact Proprioception: Appears Intact Motor  Motor Motor: Within Functional Limits Mobility  Transfers Transfers: Sit to Stand;Stand to Sit Sit to Stand: 5: Supervision  Trunk/Postural Assessment  Cervical Assessment Cervical Assessment: Within Functional Limits Thoracic Assessment Thoracic Assessment: Within Functional Limits Lumbar Assessment Lumbar Assessment: Within Functional Limits Postural Control Postural Control: Within Functional Limits  Balance Balance Balance Assessed: Yes Static Sitting Balance Static Sitting - Balance Support: No upper extremity supported Static Sitting - Level of Assistance: 6: Modified independent (Device/Increase time) Dynamic Sitting Balance Dynamic Sitting - Level of Assistance: 5: Stand by assistance Dynamic Standing Balance Dynamic Standing - Balance Support: Bilateral upper extremity supported;During functional activity Dynamic Standing - Level of Assistance: 4: Min assist Extremity/Trunk Assessment RUE Assessment RUE Assessment: Exceptions to WFL(distal radius fx) LUE Assessment LUE Assessment: Exceptions to WFL(L clavicle fx)   See Function Navigator for Current Functional Status.  Lowella Dell Chandlar Staebell 09/15/2017, 12:08  PM

## 2017-09-15 NOTE — Discharge Instructions (Signed)
Inpatient Rehab Discharge Instructions  [name] Discharge date and time: No discharge date for patient encounter.   Activities/Precautions/ Functional Status: Activity: Nonweightbearing left lower extremity.  Weightbearing as tolerated bilateral upper extremities with weightbearing through right elbow only.  Weightbearing as tolerated right lower extremity Diet: regular diet Wound Care: keep wound clean and dry Functional status:  ___ No restrictions     ___ Walk up steps independently ___ 24/7 supervision/assistance   ___ Walk up steps with assistance ___ Intermittent supervision/assistance  ___ Bathe/dress independently ___ Walk with walker     _x__ Bathe/dress with assistance ___ Walk Independently    ___ Shower independently ___ Walk with assistance    ___ Shower with assistance ___ No alcohol     ___ Return to work/school ________    COMMUNITY REFERRALS UPON DISCHARGE:    Home Health:   PT     OT     RN                     Agency:  [facility] Phone: [phone]   Medical Equipment/Items Ordered: wheelchair, cushion, walker, 3n1 commode via [facility]                                                     Agency/Supplier:  [facility] @ [phone]       Special Instructions:  No smoking alcohol or illicit drug use  Follow-up [facility] within one week  phone number [phone] to discuss resuming Suboxone and ongoing pain management with Wyvonne Lenz [phone]   Continue subcutaneous Lovenox 40 mg daily through 10/16/2017 and stop  My questions have been answered and I understand these instructions. I will adhere to these goals and the provided educational materials after my discharge from the hospital.  Patient/Caregiver Signature _______________________________ Date __________  Clinician Signature _______________________________________ Date __________  Please bring this form and your medication list with you to all  your follow-up doctor's appointments.

## 2017-09-15 NOTE — Progress Notes (Signed)
Physical Therapy Session Note  Patient Details  Name: [name] MRN: [id number] Date of Birth: 18-Oct-1980  Today's Date: 09/15/2017 PT Individual Time: 0800-0845 PT Individual Time Calculation (min): 45 min   Short Term Goals: Week 1:  PT Short Term Goal 1 (Week 1): Pt will perform bed mobility with Mod A of 1 PT Short Term Goal 2 (Week 1): Pt will perform bed<>chair transfer with Mod A of 1 PT Short Term Goal 3 (Week 1): Pt will propell W x100' with S PT Short Term Goal 4 (Week 1): Pt will initiate gait training  Skilled Therapeutic Interventions/Progress Updates:    Pt semi-reclined in bed finishing breakfast, agreeable to PT. Pt reports 8/10 pain in pelvis this AM, ice pack applied at end of session for pain relief. Bed mobility min A for LLE. Stand pivot transfer bed to bedside commode with PFRW and Supervision. Pt requires assist for clothing management when toileting. Manual w/c propulsion 2 x 150 ft Mod I. Supine to/from sit on flat bed to simulate home environment with min A for LLE. Scooting L/R in bed to simulate home environment with min A for LLE and v/c to not push through R wrist. Sit to/from stand 2 x 5 reps from progressively lower mat with Supervision to PFRW. Pt left reclined in bed with needs in reach and ice pack to R pelvic area. Pt has onset of drainage from L hip incision at end of therapy session, RN notified.  Therapy Documentation Precautions:  Precautions Precautions: Fall Required Braces or Orthoses: Other Brace/Splint Other Brace/Splint: CAM boot L LE when mobilizing OOB Restrictions Weight Bearing Restrictions: Yes RUE Weight Bearing: Weight bearing as tolerated RUE Partial Weight Bearing Percentage or Pounds: WBAT above the elbow LUE Weight Bearing: Weight bearing as tolerated RLE Weight Bearing: Weight bearing as tolerated LLE Weight Bearing: Non weight bearing  See Function Navigator for Current Functional Status.   Therapy/Group: Individual  Therapy  Peter Congo, PT, DPT  09/15/2017, 12:10 PM
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
## 2017-09-18 NOTE — Progress Notes (Signed)
Social Work  Discharge Note  The overall goal for the admission was met for:   Discharge location: Yes - home with family providing 24/7 assistance  Length of Stay: Yes - 8 days  Discharge activity level: Yes - supervision to min assist overall  Home/community participation: Yes  Services provided included: MD, RD, PT, OT, RN, TR, Pharmacy, [place]: Medicaid  Follow-up services arranged: Home Health: RN, PT, OT via [facility], DME: 16x16 lightweight w/c with ELRs, cushion, rw with right platform attachment, 3n1 commode via [facility] and Patient/Family has no preference for HH/DME agencies  Comments (or additional information):  Patient/Family verbalized understanding of follow-up arrangements: Yes  Individual responsible for coordination of the follow-up plan: pt  Confirmed correct DME delivered: Roshanna Cimino 09/18/2017    Ovella Manygoats

## 2017-09-20 ENCOUNTER — Telehealth: Payer: Self-pay

## 2017-09-20 NOTE — Telephone Encounter (Signed)
Angie from [facility] called asking if Dr. Riley Kill be signing orders. Called Angie back and left message that yes he will be the one signing orders.

## 2017-09-22 MED FILL — oxyCODONE HCL 15 MG TABS: 15 | 6 days supply | Qty: 25 | Fill #0
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
## 2017-09-29 MED FILL — oxyCODONE HCL 15 MG TABS: 15 | 10 days supply | Qty: 40 | Fill #0

## 2017-10-03 MED FILL — SULFAMETHOXAZOLE-TMP DS TAB: 800-160 | 10 days supply | Qty: 20 | Fill #0

## 2017-10-06 ENCOUNTER — Other Ambulatory Visit: Payer: Self-pay | Admitting: Student

## 2017-10-06 MED ORDER — OXYCODONE HCL 15 MG PO TABS: 15.0000 mg | ORAL_TABLET | Freq: Four times a day (QID) | ORAL | 0 refills | Status: DC | PRN

## 2017-10-24 ENCOUNTER — Encounter (HOSPITAL_COMMUNITY): Payer: Self-pay | Admitting: *Deleted

## 2017-10-24 ENCOUNTER — Other Ambulatory Visit: Payer: Self-pay

## 2017-10-24 ENCOUNTER — Ambulatory Visit: Payer: Self-pay | Admitting: Student

## 2017-10-24 DIAGNOSIS — T8130XD Disruption of wound, unspecified, subsequent encounter: Secondary | ICD-10-CM

## 2017-10-24 DIAGNOSIS — T8130XA Disruption of wound, unspecified, initial encounter: Secondary | ICD-10-CM | POA: Insufficient documentation
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
## 2017-10-25 ENCOUNTER — Ambulatory Visit (HOSPITAL_COMMUNITY)
Admission: RE | Admit: 2017-10-25 | Discharge: 2017-10-25 | Disposition: A | Discharge status: Home / Self Care | Payer: Medicaid Other | Source: Ambulatory Visit | Attending: Student | Admitting: Student

## 2017-10-25 ENCOUNTER — Ambulatory Visit (HOSPITAL_COMMUNITY): Payer: Medicaid Other | Admitting: Anesthesiology

## 2017-10-25 ENCOUNTER — Encounter (HOSPITAL_COMMUNITY): Payer: Self-pay | Admitting: Urology

## 2017-10-25 ENCOUNTER — Encounter (HOSPITAL_COMMUNITY)
Admission: RE | Disposition: A | Discharge status: Home / Self Care | Payer: Self-pay | Source: Ambulatory Visit | Attending: Student

## 2017-10-25 DIAGNOSIS — F329 Major depressive disorder, single episode, unspecified: Secondary | ICD-10-CM | POA: Diagnosis not present

## 2017-10-25 DIAGNOSIS — F17210 Nicotine dependence, cigarettes, uncomplicated: Secondary | ICD-10-CM | POA: Diagnosis not present

## 2017-10-25 DIAGNOSIS — F419 Anxiety disorder, unspecified: Secondary | ICD-10-CM | POA: Diagnosis not present

## 2017-10-25 DIAGNOSIS — T8130XD Disruption of wound, unspecified, subsequent encounter: Secondary | ICD-10-CM

## 2017-10-25 DIAGNOSIS — T8132XA Disruption of internal operation (surgical) wound, not elsewhere classified, initial encounter: Secondary | ICD-10-CM | POA: Diagnosis not present

## 2017-10-25 DIAGNOSIS — T8130XA Disruption of wound, unspecified, initial encounter: Secondary | ICD-10-CM | POA: Diagnosis present

## 2017-10-25 DIAGNOSIS — Y69 Unspecified misadventure during surgical and medical care: Secondary | ICD-10-CM | POA: Insufficient documentation

## 2017-10-25 DIAGNOSIS — Z79899 Other long term (current) drug therapy: Secondary | ICD-10-CM | POA: Diagnosis not present

## 2017-10-25 HISTORY — PX: I&D EXTREMITY: SHX5045

## 2017-10-25 HISTORY — DX: Personal history of other medical treatment: Z92.89

## 2017-10-25 LAB — HEPATIC FUNCTION PANEL
ALBUMIN: 3.4 g/dL — AB (ref 3.5–5.0)
ALK PHOS: 50 U/L (ref 38–126)
ALT: 11 U/L (ref 0–44)
AST: 15 U/L (ref 15–41)
BILIRUBIN TOTAL: 0.3 mg/dL (ref 0.3–1.2)
Bilirubin, Direct: <0.1 mg/dL (ref 0.0–0.2)
TOTAL PROTEIN: 7.2 g/dL (ref 6.5–8.1)

## 2017-10-25 LAB — SURGICAL PCR SCREEN
MRSA, PCR: NEGATIVE
Staphylococcus aureus: NEGATIVE

## 2017-10-25 LAB — CBC
HEMATOCRIT: 38.9 % (ref 36.0–46.0)
HEMOGLOBIN: 12 g/dL (ref 12.0–15.0)
MCH: 28.5 pg (ref 26.0–34.0)
MCHC: 30.8 g/dL (ref 30.0–36.0)
MCV: 92.4 fL (ref 78.0–100.0)
Platelets: 230 10*3/uL (ref 150–400)
RBC: 4.21 MIL/uL (ref 3.87–5.11)
RDW: 13.1 % (ref 11.5–15.5)
WBC: 4.3 10*3/uL (ref 4.0–10.5)

## 2017-10-25 LAB — C-REACTIVE PROTEIN: CRP: 0.8 mg/dL (ref <1.0)

## 2017-10-25 LAB — SEDIMENTATION RATE: SED RATE: 23 mm/h — AB (ref 0–22)

## 2017-10-25 LAB — BASIC METABOLIC PANEL
ANION GAP: 10 (ref 5–15)
BUN: 15 mg/dL (ref 6–20)
CHLORIDE: 104 mmol/L (ref 98–111)
CO2: 28 mmol/L (ref 22–32)
Calcium: 9.2 mg/dL (ref 8.9–10.3)
Creatinine, Ser: 0.68 mg/dL (ref 0.44–1.00)
GFR calc non Af Amer: >60 mL/min (ref >60)
Glucose, Bld: 93 mg/dL (ref 70–99)
Potassium: 4.3 mmol/L (ref 3.5–5.1)
SODIUM: 142 mmol/L (ref 135–145)

## 2017-10-25 LAB — POCT PREGNANCY, URINE: Preg Test, Ur: NEGATIVE

## 2017-10-25 SURGERY — IRRIGATION AND DEBRIDEMENT EXTREMITY
Anesthesia: General | Laterality: Left

## 2017-10-25 MED ORDER — TOBRAMYCIN SULFATE 1.2 G IJ SOLR
INTRAMUSCULAR | Status: DC | PRN
  Administered 2017-10-25: 2.4 g

## 2017-10-25 MED ORDER — PROPOFOL 10 MG/ML IV BOLUS
INTRAVENOUS | Status: DC | PRN
  Administered 2017-10-25: 200 mg via INTRAVENOUS

## 2017-10-25 MED ORDER — TOBRAMYCIN SULFATE 1.2 G IJ SOLR
INTRAMUSCULAR | Status: AC
  Filled 2017-10-25: qty 2.4

## 2017-10-25 MED ORDER — LACTATED RINGERS IV SOLN
INTRAVENOUS | Status: DC | PRN
  Administered 2017-10-25: 08:00:00 via INTRAVENOUS

## 2017-10-25 MED ORDER — OXYCODONE HCL 5 MG PO TABS
5.0000 mg | ORAL_TABLET | Freq: Once | ORAL | Status: AC | PRN
  Administered 2017-10-25: 5 mg via ORAL

## 2017-10-25 MED ORDER — FENTANYL CITRATE (PF) 250 MCG/5ML IJ SOLN
INTRAMUSCULAR | Status: DC | PRN
  Administered 2017-10-25: 50 ug via INTRAVENOUS

## 2017-10-25 MED ORDER — DEXAMETHASONE SODIUM PHOSPHATE 10 MG/ML IJ SOLN
INTRAMUSCULAR | Status: DC | PRN
  Administered 2017-10-25: 5 mg via INTRAVENOUS

## 2017-10-25 MED ORDER — HYDROMORPHONE HCL 1 MG/ML IJ SOLN
INTRAMUSCULAR | Status: DC
Start: 2017-10-25 — End: 2017-10-25
  Filled 2017-10-25: qty 1

## 2017-10-25 MED ORDER — MIDAZOLAM HCL 2 MG/2ML IJ SOLN
INTRAMUSCULAR | Status: AC
  Filled 2017-10-25: qty 2

## 2017-10-25 MED ORDER — HYDROMORPHONE HCL 1 MG/ML IJ SOLN
0.2500 mg | INTRAMUSCULAR | Status: DC | PRN
  Administered 2017-10-25: 0.5 mg via INTRAVENOUS

## 2017-10-25 MED ORDER — DOXYCYCLINE HYCLATE 100 MG PO CAPS: 100.0000 mg | ORAL_CAPSULE | Freq: Two times a day (BID) | ORAL | 0 refills | Status: AC

## 2017-10-25 MED ORDER — OXYCODONE HCL 5 MG/5ML PO SOLN: 5.0000 mg | Freq: Once | ORAL | Status: AC | PRN

## 2017-10-25 MED ORDER — LIDOCAINE 2% (20 MG/ML) 5 ML SYRINGE
INTRAMUSCULAR | Status: DC | PRN
  Administered 2017-10-25: 60 mg via INTRAVENOUS

## 2017-10-25 MED ORDER — MIDAZOLAM HCL 5 MG/5ML IJ SOLN
INTRAMUSCULAR | Status: DC | PRN
  Administered 2017-10-25: 2 mg via INTRAVENOUS

## 2017-10-25 MED ORDER — SODIUM CHLORIDE 0.9 % IR SOLN
Status: DC | PRN
  Administered 2017-10-25: 3000 mL

## 2017-10-25 MED ORDER — CLINDAMYCIN PHOSPHATE 900 MG/50ML IV SOLN
900.0000 mg | INTRAVENOUS | Status: AC
  Administered 2017-10-25: 900 mg via INTRAVENOUS
  Filled 2017-10-25: qty 50

## 2017-10-25 MED ORDER — MEPERIDINE HCL 50 MG/ML IJ SOLN: 6.2500 mg | INTRAMUSCULAR | Status: DC | PRN

## 2017-10-25 MED ORDER — OXYCODONE HCL 5 MG PO TABS
ORAL_TABLET | ORAL | Status: AC
  Filled 2017-10-25: qty 1

## 2017-10-25 MED ORDER — OXYCODONE HCL 10 MG PO TABS: 10.0000 mg | ORAL_TABLET | Freq: Four times a day (QID) | ORAL | 0 refills | Status: DC | PRN

## 2017-10-25 MED ORDER — BACITRACIN ZINC 500 UNIT/GM EX OINT
TOPICAL_OINTMENT | CUTANEOUS | Status: AC
  Filled 2017-10-25: qty 28.35

## 2017-10-25 MED ORDER — PROMETHAZINE HCL 25 MG/ML IJ SOLN: 6.2500 mg | INTRAMUSCULAR | Status: DC | PRN

## 2017-10-25 MED ORDER — BACITRACIN ZINC 500 UNIT/GM EX OINT
TOPICAL_OINTMENT | CUTANEOUS | Status: DC | PRN
  Administered 2017-10-25: 1 via TOPICAL

## 2017-10-25 MED ORDER — PROPOFOL 10 MG/ML IV BOLUS
INTRAVENOUS | Status: AC
  Filled 2017-10-25: qty 40

## 2017-10-25 MED ORDER — ONDANSETRON HCL 4 MG/2ML IJ SOLN
INTRAMUSCULAR | Status: DC | PRN
  Administered 2017-10-25: 4 mg via INTRAVENOUS

## 2017-10-25 MED ORDER — FENTANYL CITRATE (PF) 250 MCG/5ML IJ SOLN
INTRAMUSCULAR | Status: AC
  Filled 2017-10-25: qty 5

## 2017-10-25 SURGICAL SUPPLY — 38 items
BANDAGE ELASTIC 4 VELCRO ST LF (GAUZE/BANDAGES/DRESSINGS) ×3 IMPLANT
BNDG COHESIVE 4X5 TAN STRL (GAUZE/BANDAGES/DRESSINGS) ×3 IMPLANT
BRUSH SCRUB SURG 4.25 DISP (MISCELLANEOUS) ×6 IMPLANT
CHLORAPREP W/TINT 26ML (MISCELLANEOUS) ×6 IMPLANT
COVER SURGICAL LIGHT HANDLE (MISCELLANEOUS) ×3 IMPLANT
DRAPE ORTHO SPLIT 77X108 STRL (DRAPES) ×2
DRAPE SURG 17X23 STRL (DRAPES) ×3 IMPLANT
DRAPE SURG ORHT 6 SPLT 77X108 (DRAPES) ×1 IMPLANT
DRAPE U-SHAPE 47X51 STRL (DRAPES) ×3 IMPLANT
DRSG MEPITEL 4X7.2 (GAUZE/BANDAGES/DRESSINGS) ×3 IMPLANT
ELECT REM PT RETURN 9FT ADLT (ELECTROSURGICAL)
ELECTRODE REM PT RTRN 9FT ADLT (ELECTROSURGICAL) IMPLANT
GAUZE SPONGE 4X4 12PLY STRL (GAUZE/BANDAGES/DRESSINGS) ×3 IMPLANT
GLOVE BIO SURGEON STRL SZ7.5 (GLOVE) ×12 IMPLANT
GLOVE BIOGEL PI IND STRL 7.5 (GLOVE) ×1 IMPLANT
GLOVE BIOGEL PI INDICATOR 7.5 (GLOVE) ×2
GOWN STRL REUS W/ TWL LRG LVL3 (GOWN DISPOSABLE) ×2 IMPLANT
GOWN STRL REUS W/TWL LRG LVL3 (GOWN DISPOSABLE) ×4
HANDPIECE INTERPULSE COAX TIP (DISPOSABLE) ×2
KIT BASIN OR (CUSTOM PROCEDURE TRAY) ×3 IMPLANT
KIT TURNOVER KIT B (KITS) ×3 IMPLANT
MANIFOLD NEPTUNE II (INSTRUMENTS) ×3 IMPLANT
NS IRRIG 1000ML POUR BTL (IV SOLUTION) ×3 IMPLANT
PACK ORTHO EXTREMITY (CUSTOM PROCEDURE TRAY) ×3 IMPLANT
PAD ARMBOARD 7.5X6 YLW CONV (MISCELLANEOUS) ×6 IMPLANT
PAD CAST 3X4 CTTN HI CHSV (CAST SUPPLIES) ×1 IMPLANT
PADDING CAST COTTON 3X4 STRL (CAST SUPPLIES) ×2
SET HNDPC FAN SPRY TIP SCT (DISPOSABLE) ×1 IMPLANT
SPONGE LAP 18X18 X RAY DECT (DISPOSABLE) ×3 IMPLANT
SUT ETHILON 2 0 FS 18 (SUTURE) ×3 IMPLANT
SUT MON AB 2-0 CT1 36 (SUTURE) ×3 IMPLANT
SUT PDS AB 0 CT 36 (SUTURE) ×3 IMPLANT
SWAB CULTURE ESWAB REG 1ML (MISCELLANEOUS) IMPLANT
TOWEL OR 17X26 10 PK STRL BLUE (TOWEL DISPOSABLE) ×6 IMPLANT
TUBE CONNECTING 12'X1/4 (SUCTIONS) ×1
TUBE CONNECTING 12X1/4 (SUCTIONS) ×2 IMPLANT
UNDERPAD 30X30 (UNDERPADS AND DIAPERS) ×3 IMPLANT
YANKAUER SUCT BULB TIP NO VENT (SUCTIONS) ×3 IMPLANT

## 2017-10-25 NOTE — Anesthesia Procedure Notes (Signed)
Procedure Name: LMA Insertion Date/Time: 10/25/2017 8:32 AM Performed by: Lowella CurbMiller, Warren Ray, MD Pre-anesthesia Checklist: Patient identified, Emergency Drugs available, Suction available and Patient being monitored Patient Re-evaluated:Patient Re-evaluated prior to induction Oxygen Delivery Method: Circle System Utilized Preoxygenation: Pre-oxygenation with 100% oxygen Induction Type: IV induction Ventilation: Mask ventilation without difficulty LMA: LMA inserted LMA Size: 4.0 Number of attempts: 1 Airway Equipment and Method: Bite block Placement Confirmation: positive ETCO2 Tube secured with: Tape Dental Injury: Teeth and Oropharynx as per pre-operative assessment
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
## 2017-10-25 NOTE — Anesthesia Postprocedure Evaluation (Signed)
Anesthesia Post Note  Patient: Loretta ElliotKacie Townsend  Procedure(s) Performed: Irrigation and debridment and closure of wound left leg (Left )     Patient location during evaluation: PACU Anesthesia Type: General Level of consciousness: awake and alert Pain management: pain level controlled Vital Signs Assessment: post-procedure vital signs reviewed and stable Respiratory status: spontaneous breathing, nonlabored ventilation and respiratory function stable Cardiovascular status: blood pressure returned to baseline and stable Postop Assessment: no apparent nausea or vomiting Anesthetic complications: no    Last Vitals:  Vitals:   10/25/17 1035 10/25/17 1045  BP:  (!) 126/93  Pulse: 79 86  Resp: 10 14  Temp:    SpO2: 100% 100%    Last Pain:  Vitals:   10/25/17 1000  TempSrc:   PainSc: 10-Worst pain ever                 Lowella CurbWarren Ray Letti Towell

## 2017-10-25 NOTE — Anesthesia Preprocedure Evaluation (Signed)
Anesthesia Evaluation  Patient identified by MRN, date of birth, ID band Patient awake    Reviewed: Allergy & Precautions, NPO status , Patient's Chart, lab work & pertinent test results  Airway Mallampati: II  TM Distance: >3 FB Neck ROM: Limited  Mouth opening: Limited Mouth Opening  Dental  (+) Teeth Intact, Dental Advisory Given   Pulmonary neg pulmonary ROS, asthma , Current Smoker,     (-) decreased breath sounds      Cardiovascular negative cardio ROS   Rhythm:Regular Rate:Normal     Neuro/Psych Seizures -,  Anxiety Depression Bipolar Disorder negative neurological ROS  negative psych ROS   GI/Hepatic negative GI ROS, Neg liver ROS,   Endo/Other  negative endocrine ROS  Renal/GU negative Renal ROS  negative genitourinary   Musculoskeletal negative musculoskeletal ROS (+)   Abdominal   Peds negative pediatric ROS (+)  Hematology negative hematology ROS (+)   Anesthesia Other Findings   Reproductive/Obstetrics negative OB ROS                             Anesthesia Physical  Anesthesia Plan  ASA: II  Anesthesia Plan: General   Post-op Pain Management:    Induction: Intravenous  PONV Risk Score and Plan: 2 and Ondansetron and Midazolam  Airway Management Planned: LMA  Additional Equipment:   Intra-op Plan:   Post-operative Plan:   Informed Consent: I have reviewed the patients History and Physical, chart, labs and discussed the procedure including the risks, benefits and alternatives for the proposed anesthesia with the patient or authorized representative who has indicated his/her understanding and acceptance.   Dental advisory given  Plan Discussed with: CRNA and Anesthesiologist  Anesthesia Plan Comments:         Anesthesia Quick Evaluation

## 2017-10-25 NOTE — Discharge Instructions (Addendum)
Orthopaedic Trauma Service Discharge Instructions   General Discharge Instructions  WEIGHT BEARING STATUS: Continue weight bearing as tolerated on your leg  RANGE OF MOTION/ACTIVITY: No restrictions  Wound Care:You may remove your dressings on postoperative day 3 (Saturday) if they are dry you may leave open to air.  DVT/PE prophylaxis:No DVT prophylaxis is needed  Diet: as you were eating previously.  Can use over the counter stool softeners and bowel preparations, such as Miralax, to help with bowel movements.  Narcotics can be constipating.  Be sure to drink plenty of fluids  PAIN MEDICATION USE AND EXPECTATIONS  You have likely been given narcotic medications to help control your pain.  After a traumatic event that results in an fracture (broken bone) with or without surgery, it is ok to use narcotic pain medications to help control one's pain.  We understand that everyone responds to pain differently and each individual patient will be evaluated on a regular basis for the continued need for narcotic medications. Ideally, narcotic medication use should last no more than 6-8 weeks (coinciding with fracture healing).   As a patient it is your responsibility as well to monitor narcotic medication use and report the amount and frequency you use these medications when you come to your office visit.   We would also advise that if you are using narcotic medications, you should take a dose prior to therapy to maximize you participation.  IF YOU ARE ON NARCOTIC MEDICATIONS IT IS NOT PERMISSIBLE TO OPERATE A MOTOR VEHICLE (MOTORCYCLE/CAR/TRUCK/MOPED) OR HEAVY MACHINERY DO NOT MIX NARCOTICS WITH OTHER CNS (CENTRAL NERVOUS SYSTEM) DEPRESSANTS SUCH AS ALCOHOL   STOP SMOKING OR USING NICOTINE PRODUCTS!!!!  As discussed nicotine severely impairs your body's ability to heal surgical and traumatic wounds but also impairs bone healing.  Wounds and bone heal by forming microscopic blood vessels  (angiogenesis) and nicotine is a vasoconstrictor (essentially, shrinks blood vessels).  Therefore, if vasoconstriction occurs to these microscopic blood vessels they essentially disappear and are unable to deliver necessary nutrients to the healing tissue.  This is one modifiable factor that you can do to dramatically increase your chances of healing your injury.    (This means no smoking, no nicotine gum, patches, etc)  DO NOT USE NONSTEROIDAL ANTI-INFLAMMATORY DRUGS (NSAID'S)  Using products such as Advil (ibuprofen), Aleve (naproxen), Motrin (ibuprofen) for additional pain control during fracture healing can delay and/or prevent the healing response.  If you would like to take over the counter (OTC) medication, Tylenol (acetaminophen) is ok.  However, some narcotic medications that are given for pain control contain acetaminophen as well. Therefore, you should not exceed more than 4000 mg of tylenol in a day if you do not have liver disease.  Also note that there are may OTC medicines, such as cold medicines and allergy medicines that my contain tylenol as well.  If you have any questions about medications and/or interactions please ask your doctor/PA or your pharmacist.      ICE AND ELEVATE INJURED/OPERATIVE EXTREMITY  Using ice and elevating the injured extremity above your heart can help with swelling and pain control.  Icing in a pulsatile fashion, such as 20 minutes on and 20 minutes off, can be followed.    Do not place ice directly on skin. Make sure there is a barrier between to skin and the ice pack.    Using frozen items such as frozen peas works well as the conform nicely to the are that needs to be iced.  USE AN ACE WRAP OR TED HOSE FOR SWELLING CONTROL  In addition to icing and elevation, Ace wraps or TED hose are used to help limit and resolve swelling.  It is recommended to use Ace wraps or TED hose until you are informed to stop.    When using Ace Wraps start the wrapping distally  (farthest away from the body) and wrap proximally (closer to the body)   Example: If you had surgery on your leg or thing and you do not have a splint on, start the ace wrap at the toes and work your way up to the thigh        If you had surgery on your upper extremity and do not have a splint on, start the ace wrap at your fingers and work your way up to the upper arm  IF YOU ARE IN A CAM BOOT (BLACK BOOT)  You may remove boot periodically. Perform daily dressing changes as noted below.  Wash the liner of the boot regularly and wear a sock when wearing the boot. It is recommended that you sleep in the boot until told otherwise  CALL THE OFFICE WITH ANY QUESTIONS OR CONCERNS: [phone]    Discharge Wound Care Instructions  Do NOT apply any ointments, solutions or lotions to pin sites or surgical wounds.  These prevent needed drainage and even though solutions like hydrogen peroxide kill bacteria, they also damage cells lining the pin sites that help fight infection.  Applying lotions or ointments can keep the wounds moist and can cause them to breakdown and open up as well. This can increase the risk for infection. When in doubt call the office.  Surgical incisions should be dressed daily.  If any drainage is noted, use one layer of adaptic, then gauze, Kerlix, and an ace wrap.  Once the incision is completely dry and without drainage, it may be left open to air out.  Showering may begin 36-48 hours later.  Cleaning gently with soap and water.  Traumatic wounds should be dressed daily as well.    One layer of adaptic, gauze, Kerlix, then ace wrap.  The adaptic can be discontinued once the draining has ceased    If you have a wet to dry dressing: wet the gauze with saline the squeeze as much saline out so the gauze is moist (not soaking wet), place moistened gauze over wound, then place a dry gauze over the moist one, followed by Kerlix wrap, then ace wrap.

## 2017-10-25 NOTE — Interval H&P Note (Signed)
History and Physical Interval Note:  10/25/2017 7:19 AM  Loretta ElliotKacie Teffeteller  has presented today for surgery, with the diagnosis of Left wound dihisence  The various methods of treatment have been discussed with the patient and family. After consideration of risks, benefits and other options for treatment, the patient has consented to  Procedure(s): Irrigation and debridment and closure of wound left leg (Left) as a surgical intervention .  The patient's history has been reviewed, patient examined, no change in status, stable for surgery.  I have reviewed the patient's chart and labs.  Questions were answered to the patient's satisfaction.     Caryn BeeKevin P Kenidy Crossland

## 2017-10-25 NOTE — Transfer of Care (Signed)
Immediate Anesthesia Transfer of Care Note  Patient: [name]  Procedure(s) Performed: Irrigation and debridment and closure of wound left leg (Left )  Patient Location: PACU  Anesthesia Type:General  Level of Consciousness: awake, alert  and oriented  Airway & Oxygen Therapy: Patient Spontanous Breathing and Patient connected to nasal cannula oxygen  Post-op Assessment: Report given to RN and Post -op Vital signs reviewed and stable  Post vital signs: Reviewed and stable  Last Vitals:  Vitals Value Taken Time  BP 106/76 10/25/2017  9:45 AM  Temp    Pulse 82 10/25/2017  9:47 AM  Resp 10 10/25/2017  9:47 AM  SpO2 100 % 10/25/2017  9:47 AM  Vitals shown include unvalidated device data.  Last Pain:  Vitals:   10/25/17 0722  TempSrc:   PainSc: 8       Patients Stated Pain Goal: 5 (10/25/17 [id number])  Complications: No apparent anesthesia complications

## 2017-10-26 ENCOUNTER — Encounter (HOSPITAL_COMMUNITY): Payer: Self-pay | Admitting: Student

## 2017-11-28 ENCOUNTER — Ambulatory Visit: Payer: Self-pay | Admitting: Student

## 2017-11-28 ENCOUNTER — Encounter (HOSPITAL_COMMUNITY): Payer: Self-pay | Admitting: Anesthesiology

## 2017-11-28 DIAGNOSIS — T8130XD Disruption of wound, unspecified, subsequent encounter: Secondary | ICD-10-CM

## 2017-11-28 NOTE — Anesthesia Preprocedure Evaluation (Addendum)
Anesthesia Evaluation  Patient identified by MRN, date of birth, ID band Patient awake    Reviewed: Allergy & Precautions, NPO status , Patient's Chart, lab work & pertinent test results  Airway Mallampati: I  TM Distance: >3 FB Neck ROM: Full    Dental  (+) Dental Advisory Given, Teeth Intact   Pulmonary asthma , Current Smoker,    Pulmonary exam normal        Cardiovascular negative cardio ROS Normal cardiovascular exam     Neuro/Psych Seizures -,  PSYCHIATRIC DISORDERS Anxiety Depression Bipolar Disorder    GI/Hepatic negative GI ROS, Neg liver ROS,   Endo/Other  negative endocrine ROS  Renal/GU negative Renal ROS     Musculoskeletal negative musculoskeletal ROS (+)   Abdominal Normal abdominal exam  (+)   Peds  Hematology   Anesthesia Other Findings   Reproductive/Obstetrics                           Anesthesia Physical Anesthesia Plan  ASA: II  Anesthesia Plan: General   Post-op Pain Management:    Induction: Intravenous  PONV Risk Score and Plan: 3 and Ondansetron, Dexamethasone and Midazolam  Airway Management Planned: LMA  Additional Equipment: None  Intra-op Plan:   Post-operative Plan: Extubation in OR  Informed Consent: I have reviewed the patients History and Physical, chart, labs and discussed the procedure including the risks, benefits and alternatives for the proposed anesthesia with the patient or authorized representative who has indicated his/her understanding and acceptance.   Dental advisory given  Plan Discussed with: CRNA  Anesthesia Plan Comments:        Anesthesia Quick Evaluation

## 2017-11-28 NOTE — Progress Notes (Signed)
Unable to reach pt by her cell phone or the home number. Left pre-op instructions on pt's cell phone.

## 2017-11-29 ENCOUNTER — Encounter (HOSPITAL_COMMUNITY)
Admission: RE | Disposition: A | Discharge status: Home / Self Care | Payer: Self-pay | Source: Ambulatory Visit | Attending: Student

## 2017-11-29 ENCOUNTER — Ambulatory Visit (HOSPITAL_COMMUNITY): Payer: Medicaid Other | Admitting: Anesthesiology

## 2017-11-29 ENCOUNTER — Ambulatory Visit (HOSPITAL_COMMUNITY)
Admission: RE | Admit: 2017-11-29 | Discharge: 2017-11-29 | Disposition: A | Discharge status: Home / Self Care | Payer: Medicaid Other | Source: Ambulatory Visit | Attending: Student | Admitting: Student

## 2017-11-29 ENCOUNTER — Encounter (HOSPITAL_COMMUNITY): Payer: Self-pay | Admitting: *Deleted

## 2017-11-29 ENCOUNTER — Other Ambulatory Visit: Payer: Self-pay

## 2017-11-29 DIAGNOSIS — T8131XA Disruption of external operation (surgical) wound, not elsewhere classified, initial encounter: Secondary | ICD-10-CM | POA: Diagnosis not present

## 2017-11-29 DIAGNOSIS — Z79899 Other long term (current) drug therapy: Secondary | ICD-10-CM | POA: Diagnosis not present

## 2017-11-29 DIAGNOSIS — F17200 Nicotine dependence, unspecified, uncomplicated: Secondary | ICD-10-CM | POA: Diagnosis not present

## 2017-11-29 DIAGNOSIS — F329 Major depressive disorder, single episode, unspecified: Secondary | ICD-10-CM | POA: Diagnosis not present

## 2017-11-29 DIAGNOSIS — F419 Anxiety disorder, unspecified: Secondary | ICD-10-CM | POA: Insufficient documentation

## 2017-11-29 DIAGNOSIS — R569 Unspecified convulsions: Secondary | ICD-10-CM | POA: Diagnosis not present

## 2017-11-29 DIAGNOSIS — T8130XA Disruption of wound, unspecified, initial encounter: Secondary | ICD-10-CM | POA: Insufficient documentation

## 2017-11-29 DIAGNOSIS — T8130XD Disruption of wound, unspecified, subsequent encounter: Secondary | ICD-10-CM

## 2017-11-29 DIAGNOSIS — Y838 Other surgical procedures as the cause of abnormal reaction of the patient, or of later complication, without mention of misadventure at the time of the procedure: Secondary | ICD-10-CM | POA: Diagnosis not present

## 2017-11-29 HISTORY — PX: I&D EXTREMITY: SHX5045

## 2017-11-29 LAB — BASIC METABOLIC PANEL
Anion gap: 11 (ref 5–15)
BUN: 13 mg/dL (ref 6–20)
CALCIUM: 9.4 mg/dL (ref 8.9–10.3)
CO2: 28 mmol/L (ref 22–32)
Chloride: 101 mmol/L (ref 98–111)
Creatinine, Ser: 0.56 mg/dL (ref 0.44–1.00)
GLUCOSE: 102 mg/dL — AB (ref 70–99)
POTASSIUM: 4.1 mmol/L (ref 3.5–5.1)
Sodium: 140 mmol/L (ref 135–145)

## 2017-11-29 LAB — SEDIMENTATION RATE: Sed Rate: 23 mm/hr — ABNORMAL HIGH (ref 0–22)

## 2017-11-29 LAB — CBC WITH DIFFERENTIAL/PLATELET
ABS IMMATURE GRANULOCYTES: 0 10*3/uL (ref 0.0–0.1)
Basophils Absolute: 0.1 10*3/uL (ref 0.0–0.1)
Basophils Relative: 1 %
Eosinophils Absolute: 0.3 10*3/uL (ref 0.0–0.7)
Eosinophils Relative: 2 %
HEMATOCRIT: 42.2 % (ref 36.0–46.0)
Hemoglobin: 13.3 g/dL (ref 12.0–15.0)
Immature Granulocytes: 0 %
LYMPHS ABS: 2.8 10*3/uL (ref 0.7–4.0)
LYMPHS PCT: 27 %
MCH: 29 pg (ref 26.0–34.0)
MCHC: 31.5 g/dL (ref 30.0–36.0)
MCV: 92.1 fL (ref 78.0–100.0)
MONOS PCT: 6 %
Monocytes Absolute: 0.6 10*3/uL (ref 0.1–1.0)
NEUTROS ABS: 6.8 10*3/uL (ref 1.7–7.7)
NEUTROS PCT: 64 %
Platelets: 346 10*3/uL (ref 150–400)
RBC: 4.58 MIL/uL (ref 3.87–5.11)
RDW: 12.9 % (ref 11.5–15.5)
WBC: 10.6 10*3/uL — AB (ref 4.0–10.5)

## 2017-11-29 LAB — POCT PREGNANCY, URINE: PREG TEST UR: NEGATIVE

## 2017-11-29 LAB — C-REACTIVE PROTEIN: CRP: 1.1 mg/dL — ABNORMAL HIGH (ref <1.0)

## 2017-11-29 SURGERY — IRRIGATION AND DEBRIDEMENT EXTREMITY
Anesthesia: General | Laterality: Left

## 2017-11-29 MED ORDER — FENTANYL CITRATE (PF) 250 MCG/5ML IJ SOLN
INTRAMUSCULAR | Status: AC
  Filled 2017-11-29: qty 5

## 2017-11-29 MED ORDER — SODIUM CHLORIDE 0.9 % IR SOLN
Status: DC | PRN
  Administered 2017-11-29: 3000 mL

## 2017-11-29 MED ORDER — HYDROMORPHONE HCL 1 MG/ML IJ SOLN
0.2500 mg | INTRAMUSCULAR | Status: DC | PRN
  Administered 2017-11-29 (×4): 0.5 mg via INTRAVENOUS

## 2017-11-29 MED ORDER — TOBRAMYCIN SULFATE 1.2 G IJ SOLR
INTRAMUSCULAR | Status: AC
  Filled 2017-11-29: qty 1.2

## 2017-11-29 MED ORDER — TOBRAMYCIN SULFATE 1.2 G IJ SOLR
INTRAMUSCULAR | Status: DC | PRN
  Administered 2017-11-29: 1.2 g via TOPICAL

## 2017-11-29 MED ORDER — 0.9 % SODIUM CHLORIDE (POUR BTL) OPTIME
TOPICAL | Status: DC | PRN
  Administered 2017-11-29: 1000 mL

## 2017-11-29 MED ORDER — DOXYCYCLINE HYCLATE 100 MG PO CAPS: 100.0000 mg | ORAL_CAPSULE | Freq: Two times a day (BID) | ORAL | 0 refills | Status: AC

## 2017-11-29 MED ORDER — PROMETHAZINE HCL 25 MG/ML IJ SOLN
6.2500 mg | INTRAMUSCULAR | Status: DC | PRN
  Administered 2017-11-29: 6.25 mg via INTRAVENOUS

## 2017-11-29 MED ORDER — DEXAMETHASONE SODIUM PHOSPHATE 10 MG/ML IJ SOLN
INTRAMUSCULAR | Status: AC
  Filled 2017-11-29: qty 1

## 2017-11-29 MED ORDER — METHOCARBAMOL 500 MG PO TABS
ORAL_TABLET | ORAL | Status: AC
  Administered 2017-11-29: 750 mg via ORAL
  Filled 2017-11-29: qty 2

## 2017-11-29 MED ORDER — LACTATED RINGERS IV SOLN: INTRAVENOUS | Status: DC

## 2017-11-29 MED ORDER — LIDOCAINE 2% (20 MG/ML) 5 ML SYRINGE
INTRAMUSCULAR | Status: AC
  Filled 2017-11-29: qty 5

## 2017-11-29 MED ORDER — CHLORHEXIDINE GLUCONATE 4 % EX LIQD: 60.0000 mL | Freq: Once | CUTANEOUS | Status: DC

## 2017-11-29 MED ORDER — METHOCARBAMOL 500 MG PO TABS
750.0000 mg | ORAL_TABLET | Freq: Once | ORAL | Status: AC
  Administered 2017-11-29: 750 mg via ORAL

## 2017-11-29 MED ORDER — HYDROCODONE-ACETAMINOPHEN 5-325 MG PO TABS: 1.0000 | ORAL_TABLET | Freq: Four times a day (QID) | ORAL | 0 refills | Status: DC | PRN

## 2017-11-29 MED ORDER — LACTATED RINGERS IV SOLN
INTRAVENOUS | Status: DC | PRN
  Administered 2017-11-29 (×2): via INTRAVENOUS

## 2017-11-29 MED ORDER — MIDAZOLAM HCL 2 MG/2ML IJ SOLN
INTRAMUSCULAR | Status: AC
  Filled 2017-11-29: qty 2

## 2017-11-29 MED ORDER — FENTANYL CITRATE (PF) 250 MCG/5ML IJ SOLN
INTRAMUSCULAR | Status: DC | PRN
  Administered 2017-11-29 (×2): 25 ug via INTRAVENOUS
  Administered 2017-11-29 (×4): 50 ug via INTRAVENOUS

## 2017-11-29 MED ORDER — LIDOCAINE 2% (20 MG/ML) 5 ML SYRINGE
INTRAMUSCULAR | Status: DC | PRN
  Administered 2017-11-29: 50 mg via INTRAVENOUS

## 2017-11-29 MED ORDER — HYDROMORPHONE HCL 1 MG/ML IJ SOLN
INTRAMUSCULAR | Status: AC
  Administered 2017-11-29: 0.5 mg via INTRAVENOUS
  Filled 2017-11-29: qty 1

## 2017-11-29 MED ORDER — CLINDAMYCIN PHOSPHATE 900 MG/50ML IV SOLN
900.0000 mg | INTRAVENOUS | Status: AC
  Administered 2017-11-29: 900 mg via INTRAVENOUS
  Filled 2017-11-29: qty 50

## 2017-11-29 MED ORDER — HYDROCODONE-ACETAMINOPHEN 5-325 MG PO TABS
ORAL_TABLET | ORAL | Status: AC
  Administered 2017-11-29: 1 via ORAL
  Filled 2017-11-29: qty 1

## 2017-11-29 MED ORDER — DEXAMETHASONE SODIUM PHOSPHATE 10 MG/ML IJ SOLN
INTRAMUSCULAR | Status: DC | PRN
  Administered 2017-11-29: 10 mg via INTRAVENOUS

## 2017-11-29 MED ORDER — METHOCARBAMOL 1000 MG/10ML IJ SOLN: 750.0000 mg | Freq: Once | INTRAVENOUS | Status: DC

## 2017-11-29 MED ORDER — MEPERIDINE HCL 50 MG/ML IJ SOLN: 6.2500 mg | INTRAMUSCULAR | Status: DC | PRN

## 2017-11-29 MED ORDER — PROMETHAZINE HCL 25 MG/ML IJ SOLN
INTRAMUSCULAR | Status: AC
  Filled 2017-11-29: qty 1

## 2017-11-29 MED ORDER — PROPOFOL 10 MG/ML IV BOLUS
INTRAVENOUS | Status: AC
  Filled 2017-11-29: qty 40

## 2017-11-29 MED ORDER — ONDANSETRON HCL 4 MG/2ML IJ SOLN
INTRAMUSCULAR | Status: AC
  Filled 2017-11-29: qty 2

## 2017-11-29 MED ORDER — PROPOFOL 10 MG/ML IV BOLUS
INTRAVENOUS | Status: DC | PRN
  Administered 2017-11-29: 200 mg via INTRAVENOUS

## 2017-11-29 MED ORDER — MIDAZOLAM HCL 2 MG/2ML IJ SOLN
INTRAMUSCULAR | Status: DC | PRN
  Administered 2017-11-29: 2 mg via INTRAVENOUS

## 2017-11-29 MED ORDER — ONDANSETRON HCL 4 MG/2ML IJ SOLN
INTRAMUSCULAR | Status: DC | PRN
  Administered 2017-11-29: 4 mg via INTRAVENOUS

## 2017-11-29 MED ORDER — HYDROCODONE-ACETAMINOPHEN 5-325 MG PO TABS
1.0000 | ORAL_TABLET | Freq: Once | ORAL | Status: AC
  Administered 2017-11-29: 1 via ORAL

## 2017-11-29 SURGICAL SUPPLY — 38 items
BANDAGE ACE 4X5 VEL STRL LF (GAUZE/BANDAGES/DRESSINGS) ×3 IMPLANT
BANDAGE ACE 6X5 VEL STRL LF (GAUZE/BANDAGES/DRESSINGS) ×3 IMPLANT
BNDG COHESIVE 4X5 TAN STRL (GAUZE/BANDAGES/DRESSINGS) ×3 IMPLANT
CHLORAPREP W/TINT 26ML (MISCELLANEOUS) ×3 IMPLANT
COVER MAYO STAND STRL (DRAPES) ×3 IMPLANT
COVER SURGICAL LIGHT HANDLE (MISCELLANEOUS) ×3 IMPLANT
DRAPE ORTHO SPLIT 77X108 STRL (DRAPES) ×2
DRAPE SURG ORHT 6 SPLT 77X108 (DRAPES) ×1 IMPLANT
DRAPE U-SHAPE 47X51 STRL (DRAPES) ×3 IMPLANT
DRESSING PREVENA PLUS CUSTOM (GAUZE/BANDAGES/DRESSINGS) ×1 IMPLANT
DRSG PREVENA PLUS CUSTOM (GAUZE/BANDAGES/DRESSINGS) ×3
DRSG TELFA 3X8 NADH (GAUZE/BANDAGES/DRESSINGS) ×3 IMPLANT
ELECT REM PT RETURN 9FT ADLT (ELECTROSURGICAL)
ELECTRODE REM PT RTRN 9FT ADLT (ELECTROSURGICAL) IMPLANT
GLOVE BIO SURGEON STRL SZ7.5 (GLOVE) ×12 IMPLANT
GLOVE BIOGEL PI IND STRL 7.5 (GLOVE) ×1 IMPLANT
GLOVE BIOGEL PI INDICATOR 7.5 (GLOVE) ×2
GOWN STRL REUS W/ TWL LRG LVL3 (GOWN DISPOSABLE) ×2 IMPLANT
GOWN STRL REUS W/TWL LRG LVL3 (GOWN DISPOSABLE) ×4
KIT BASIN OR (CUSTOM PROCEDURE TRAY) ×3 IMPLANT
KIT TURNOVER KIT B (KITS) ×3 IMPLANT
MANIFOLD NEPTUNE II (INSTRUMENTS) ×3 IMPLANT
NS IRRIG 1000ML POUR BTL (IV SOLUTION) ×3 IMPLANT
PACK ORTHO EXTREMITY (CUSTOM PROCEDURE TRAY) ×3 IMPLANT
PAD ARMBOARD 7.5X6 YLW CONV (MISCELLANEOUS) ×6 IMPLANT
SET CYSTO W/LG BORE CLAMP LF (SET/KITS/TRAYS/PACK) ×3 IMPLANT
SPONGE LAP 18X18 X RAY DECT (DISPOSABLE) ×3 IMPLANT
SUT ETHILON 2 0 FS 18 (SUTURE) ×3 IMPLANT
SUT MON AB 2-0 CT1 36 (SUTURE) ×3 IMPLANT
SUT PDS AB 0 CT 36 (SUTURE) IMPLANT
SWAB CULTURE ESWAB REG 1ML (MISCELLANEOUS) IMPLANT
TOWEL OR 17X24 6PK STRL BLUE (TOWEL DISPOSABLE) ×3 IMPLANT
TOWEL OR 17X26 10 PK STRL BLUE (TOWEL DISPOSABLE) ×6 IMPLANT
TUBE CONNECTING 12'X1/4 (SUCTIONS) ×1
TUBE CONNECTING 12X1/4 (SUCTIONS) ×2 IMPLANT
UNDERPAD 30X30 (UNDERPADS AND DIAPERS) ×3 IMPLANT
WATER STERILE IRR 1000ML POUR (IV SOLUTION) ×3 IMPLANT
YANKAUER SUCT BULB TIP NO VENT (SUCTIONS) ×3 IMPLANT

## 2017-11-29 NOTE — Anesthesia Postprocedure Evaluation (Signed)
Anesthesia Post Note  Patient: Loretta ElliotKacie Townsend  Procedure(s) Performed: IRRIGATION AND DEBRIDEMENT EXTREMITY (Left )     Patient location during evaluation: PACU Anesthesia Type: General Level of consciousness: awake and alert Pain management: pain level controlled Vital Signs Assessment: post-procedure vital signs reviewed and stable Respiratory status: spontaneous breathing, nonlabored ventilation, respiratory function stable and patient connected to nasal cannula oxygen Cardiovascular status: blood pressure returned to baseline and stable Postop Assessment: no apparent nausea or vomiting Anesthetic complications: no    Last Vitals:  Vitals:   11/29/17 1030 11/29/17 1035  BP:  118/90  Pulse: 86 73  Resp: 15 13  Temp:    SpO2: 98% 100%                  Shelton SilvasKevin D Jourdan Durbin

## 2017-11-29 NOTE — H&P (Signed)
Patient with continued drainage from her tibia wound. Plan to revise and close after washout to try and prevent infection.

## 2017-11-29 NOTE — Anesthesia Procedure Notes (Signed)
Procedure Name: LMA Insertion Date/Time: 11/29/2017 8:40 AM Performed by: Nils PyleBell, Delesa Kawa T, CRNA Pre-anesthesia Checklist: Patient identified, Emergency Drugs available, Suction available and Patient being monitored Patient Re-evaluated:Patient Re-evaluated prior to induction Oxygen Delivery Method: Circle System Utilized Preoxygenation: Pre-oxygenation with 100% oxygen Induction Type: IV induction Ventilation: Mask ventilation without difficulty LMA: LMA inserted LMA Size: 4.0 Number of attempts: 1 Airway Equipment and Method: Bite block Placement Confirmation: positive ETCO2 Tube secured with: Tape Dental Injury: Teeth and Oropharynx as per pre-operative assessment

## 2017-11-29 NOTE — Discharge Instructions (Addendum)
[facility] Discharge Instructions   General Discharge Instructions  RANGE OF MOTION/ACTIVITY: You must wear the boot for the next week to allow the tissues to settle down and heal  Wound Care: Keep dressing in place until I see you on Tuesday  DVT/PE prophylaxis: None needed  Diet: as you were eating previously.  Can use over the counter stool softeners and bowel preparations, such as Miralax, to help with bowel movements.  Narcotics can be constipating.  Be sure to drink plenty of fluids  USE AN ACE WRAP OR TED HOSE FOR SWELLING CONTROL  In addition to icing and elevation, Ace wraps or TED hose are used to help limit and resolve swelling.  It is recommended to use Ace wraps or TED hose until you are informed to stop.    When using Ace Wraps start the wrapping distally (farthest away from the body) and wrap proximally (closer to the body)   Example: If you had surgery on your leg or thing and you do not have a splint on, start the ace wrap at the toes and work your way up to the thigh        If you had surgery on your upper extremity and do not have a splint on, start the ace wrap at your fingers and work your way up to the upper arm  CALL THE OFFICE WITH ANY QUESTIONS OR CONCERNS: [phone]  General Anesthesia, Adult, Care After These instructions provide you with information about caring for yourself after your procedure. Your health care provider may also give you more specific instructions. Your treatment has been planned according to current medical practices, but problems sometimes occur. Call your health care provider if you have any problems or questions after your procedure. What can I expect after the procedure? After the procedure, it is common to have:  Vomiting.  A sore throat.  Mental slowness.  It is common to feel:  Nauseous.  Cold or shivery.  Sleepy.  Tired.  Sore or achy, even in parts of your body where you did not have  surgery.  Follow these instructions at home: For at least 24 hours after the procedure:  Do not: ? Participate in activities where you could fall or become injured. ? Drive. ? Use heavy machinery. ? Drink alcohol. ? Take sleeping pills or medicines that cause drowsiness. ? Make important decisions or sign legal documents. ? Take care of children on your own.  Rest. Eating and drinking  If you vomit, drink water, juice, or soup when you can drink without vomiting.  Drink enough fluid to keep your urine clear or pale yellow.  Make sure you have little or no nausea before eating solid foods.  Follow the diet recommended by your health care provider. General instructions  Have a responsible adult stay with you until you are awake and alert.  Return to your normal activities as told by your health care provider. Ask your health care provider what activities are safe for you.  Take over-the-counter and prescription medicines only as told by your health care provider.  If you smoke, do not smoke without supervision.  Keep all follow-up visits as told by your health care provider. This is important. Contact a health care provider if:  You continue to have nausea or vomiting at home, and medicines are not helpful.  You cannot drink fluids or start eating again.  You cannot urinate after 8-12 hours.  You develop a skin rash.  You have  fever.  You have increasing redness at the site of your procedure. Get help right away if:  You have difficulty breathing.  You have chest pain.  You have unexpected bleeding.  You feel that you are having a life-threatening or urgent problem. This information is not intended to replace advice given to you by your health care provider. Make sure you discuss any questions you have with your health care provider. Document Released: 07/18/2000 Document Revised: 09/14/2015 Document Reviewed: 03/26/2015 Elsevier Interactive Patient Education   [employer].
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
## 2017-11-29 NOTE — Transfer of Care (Signed)
Immediate Anesthesia Transfer of Care Note  Patient: [name]  Procedure(s) Performed: IRRIGATION AND DEBRIDEMENT EXTREMITY (Left )  Patient Location: PACU  Anesthesia Type:General  Level of Consciousness: awake, alert  and oriented  Airway & Oxygen Therapy: Patient Spontanous Breathing  Post-op Assessment: Report given to RN, Post -op Vital signs reviewed and stable and Patient moving all extremities X 4  Post vital signs: Reviewed and stable  Last Vitals:  Vitals Value Taken Time  BP 126/93 11/29/2017  9:35 AM  Temp    Pulse 96 11/29/2017  9:35 AM  Resp 17 11/29/2017  9:35 AM  SpO2 100 % 11/29/2017  9:35 AM  Vitals shown include unvalidated device data.  Last Pain:  Vitals:   11/29/17 0735  TempSrc:   PainSc: 6          Complications: No apparent anesthesia complications

## 2017-11-30 ENCOUNTER — Encounter (HOSPITAL_COMMUNITY): Payer: Self-pay | Admitting: Student

## 2017-12-04 LAB — AEROBIC/ANAEROBIC CULTURE (SURGICAL/DEEP WOUND): CULTURE: NORMAL

## 2017-12-04 LAB — AEROBIC/ANAEROBIC CULTURE W GRAM STAIN (SURGICAL/DEEP WOUND): Gram Stain: NONE SEEN
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
## 2018-02-19 ENCOUNTER — Encounter (HOSPITAL_COMMUNITY): Payer: Self-pay | Admitting: *Deleted

## 2018-02-19 ENCOUNTER — Other Ambulatory Visit: Payer: Self-pay

## 2018-02-19 DIAGNOSIS — B839 Helminthiasis, unspecified: Secondary | ICD-10-CM | POA: Diagnosis not present

## 2018-02-19 DIAGNOSIS — F17210 Nicotine dependence, cigarettes, uncomplicated: Secondary | ICD-10-CM | POA: Diagnosis not present

## 2018-02-19 DIAGNOSIS — Z79899 Other long term (current) drug therapy: Secondary | ICD-10-CM | POA: Diagnosis not present

## 2018-02-19 DIAGNOSIS — J45909 Unspecified asthma, uncomplicated: Secondary | ICD-10-CM | POA: Diagnosis not present

## 2018-02-19 DIAGNOSIS — R1084 Generalized abdominal pain: Secondary | ICD-10-CM | POA: Diagnosis not present

## 2018-02-19 LAB — COMPREHENSIVE METABOLIC PANEL
ALT: 17 U/L (ref 0–44)
AST: 23 U/L (ref 15–41)
Albumin: 3.7 g/dL (ref 3.5–5.0)
Alkaline Phosphatase: 49 U/L (ref 38–126)
Anion gap: 8 (ref 5–15)
BUN: 12 mg/dL (ref 6–20)
CALCIUM: 8.8 mg/dL — AB (ref 8.9–10.3)
CO2: 31 mmol/L (ref 22–32)
Chloride: 102 mmol/L (ref 98–111)
Creatinine, Ser: 0.66 mg/dL (ref 0.44–1.00)
GFR calc non Af Amer: >60 mL/min (ref >60)
GLUCOSE: 81 mg/dL (ref 70–99)
Potassium: 3.3 mmol/L — ABNORMAL LOW (ref 3.5–5.1)
SODIUM: 141 mmol/L (ref 135–145)
Total Bilirubin: 0.3 mg/dL (ref 0.3–1.2)
Total Protein: 8 g/dL (ref 6.5–8.1)

## 2018-02-19 LAB — CBC
HCT: 38.6 % (ref 36.0–46.0)
Hemoglobin: 11.9 g/dL — ABNORMAL LOW (ref 12.0–15.0)
MCH: 27.5 pg (ref 26.0–34.0)
MCHC: 30.8 g/dL (ref 30.0–36.0)
MCV: 89.1 fL (ref 80.0–100.0)
NRBC: 0 % (ref 0.0–0.2)
PLATELETS: 218 10*3/uL (ref 150–400)
RBC: 4.33 MIL/uL (ref 3.87–5.11)
RDW: 12.4 % (ref 11.5–15.5)
WBC: 4.6 10*3/uL (ref 4.0–10.5)

## 2018-02-19 LAB — I-STAT BETA HCG BLOOD, ED (MC, WL, AP ONLY)

## 2018-02-19 LAB — URINALYSIS, ROUTINE W REFLEX MICROSCOPIC
Bilirubin Urine: NEGATIVE
GLUCOSE, UA: NEGATIVE mg/dL
HGB URINE DIPSTICK: NEGATIVE
Ketones, ur: 5 mg/dL — AB
Leukocytes, UA: NEGATIVE
Nitrite: NEGATIVE
PROTEIN: NEGATIVE mg/dL
Specific Gravity, Urine: 1.026 (ref 1.005–1.030)
pH: 5 (ref 5.0–8.0)

## 2018-02-19 LAB — LIPASE, BLOOD: LIPASE: 18 U/L (ref 11–51)

## 2018-02-19 NOTE — ED Triage Notes (Signed)
Pt reports she feels like she is having an issue with her hemorrhoids (have blood in her stool when she has a BM and feels like they are external) and mucous in her stool x2 days. She said she did have emesis and vomiting at night 2 days ago. She has also had migraines.

## 2018-02-19 NOTE — ED Notes (Signed)
Pt mentioned that she was having unusual bowel movements that she was attributing to what looked like worms. Also, she said she has been having a decreased appetite.

## 2018-02-20 ENCOUNTER — Emergency Department (HOSPITAL_COMMUNITY)
Admission: EM | Admit: 2018-02-20 | Discharge: 2018-02-20 | Disposition: A | Discharge status: Home / Self Care | Payer: Medicaid Other | Attending: Emergency Medicine | Admitting: Emergency Medicine

## 2018-02-20 DIAGNOSIS — R1084 Generalized abdominal pain: Secondary | ICD-10-CM

## 2018-02-20 DIAGNOSIS — B839 Helminthiasis, unspecified: Secondary | ICD-10-CM

## 2018-02-20 MED ORDER — MEBENDAZOLE 100 MG PO CHEW: 100.0000 mg | CHEWABLE_TABLET | Freq: Two times a day (BID) | ORAL | 0 refills | Status: DC
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
## 2018-02-20 NOTE — Discharge Instructions (Signed)
Mebendazole as prescribed.  Follow-up with your primary doctor and GI in the next week.  Return to the emergency department if symptoms significantly worsen or change in the meantime.

## 2018-10-22 ENCOUNTER — Other Ambulatory Visit: Payer: Self-pay | Admitting: Student

## 2018-10-22 DIAGNOSIS — S82252F Displaced comminuted fracture of shaft of left tibia, subsequent encounter for open fracture type IIIA, IIIB, or IIIC with routine healing: Secondary | ICD-10-CM

## 2018-10-22 DIAGNOSIS — S7222XF Displaced subtrochanteric fracture of left femur, subsequent encounter for open fracture type IIIA, IIIB, or IIIC with routine healing: Secondary | ICD-10-CM

## 2018-10-24 ENCOUNTER — Ambulatory Visit
Admission: RE | Admit: 2018-10-24 | Discharge: 2018-10-24 | Disposition: A | Discharge status: Home / Self Care | Payer: Medicaid Other | Source: Ambulatory Visit | Attending: Student | Admitting: Student

## 2018-10-24 DIAGNOSIS — S82252F Displaced comminuted fracture of shaft of left tibia, subsequent encounter for open fracture type IIIA, IIIB, or IIIC with routine healing: Secondary | ICD-10-CM

## 2018-10-24 DIAGNOSIS — S7222XF Displaced subtrochanteric fracture of left femur, subsequent encounter for open fracture type IIIA, IIIB, or IIIC with routine healing: Secondary | ICD-10-CM

## 2018-11-06 ENCOUNTER — Ambulatory Visit: Payer: Self-pay | Admitting: Student

## 2018-11-06 DIAGNOSIS — S82222B Displaced transverse fracture of shaft of left tibia, initial encounter for open fracture type I or II: Secondary | ICD-10-CM | POA: Insufficient documentation

## 2018-11-12 ENCOUNTER — Other Ambulatory Visit: Payer: Self-pay

## 2018-11-12 ENCOUNTER — Encounter (HOSPITAL_COMMUNITY): Payer: Self-pay | Admitting: *Deleted

## 2018-11-12 ENCOUNTER — Other Ambulatory Visit (HOSPITAL_COMMUNITY)
Admission: RE | Admit: 2018-11-12 | Discharge: 2018-11-12 | Disposition: A | Discharge status: Home / Self Care | Payer: Medicaid Other | Source: Ambulatory Visit | Attending: Student | Admitting: Student

## 2018-11-12 DIAGNOSIS — Z1159 Encounter for screening for other viral diseases: Secondary | ICD-10-CM | POA: Diagnosis not present
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
## 2018-11-13 LAB — SARS CORONAVIRUS 2 (TAT 6-24 HRS): SARS Coronavirus 2: NEGATIVE
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
## 2018-11-13 NOTE — Anesthesia Preprocedure Evaluation (Addendum)
Anesthesia Evaluation  Patient identified by MRN, date of birth, ID band Patient awake    Reviewed: Allergy & Precautions, NPO status , Patient's Chart, lab work & pertinent test results  Airway Mallampati: I  TM Distance: >3 FB Neck ROM: Full    Dental no notable dental hx. (+) Poor Dentition, Dental Advisory Given,    Pulmonary asthma , Current Smoker,    Pulmonary exam normal breath sounds clear to auscultation       Cardiovascular negative cardio ROS Normal cardiovascular exam Rhythm:Regular Rate:Normal     Neuro/Psych Seizures - (last one 08/29/17),  PSYCHIATRIC DISORDERS Anxiety Depression Bipolar Disorder    GI/Hepatic GERD  Medicated and Controlled,(+)     substance abuse  cocaine use, marijuana use and IV drug use,   Endo/Other  negative endocrine ROS  Renal/GU negative Renal ROS  negative genitourinary   Musculoskeletal negative musculoskeletal ROS (+) narcotic dependent  Abdominal   Peds  Hematology negative hematology ROS (+)   Anesthesia Other Findings   Reproductive/Obstetrics                           Anesthesia Physical Anesthesia Plan  ASA: II  Anesthesia Plan: General   Post-op Pain Management:    Induction: Intravenous  PONV Risk Score and Plan: 2 and Ondansetron, Dexamethasone and Midazolam  Airway Management Planned: Oral ETT and LMA  Additional Equipment:   Intra-op Plan:   Post-operative Plan: Extubation in OR  Informed Consent: I have reviewed the patients History and Physical, chart, labs and discussed the procedure including the risks, benefits and alternatives for the proposed anesthesia with the patient or authorized representative who has indicated his/her understanding and acceptance.     Dental advisory given  Plan Discussed with: CRNA  Anesthesia Plan Comments: (PAT note written 11/13/2018 by Myra Gianotti, PA-C. )      Anesthesia  Quick Evaluation

## 2018-11-14 ENCOUNTER — Other Ambulatory Visit: Payer: Self-pay

## 2018-11-14 ENCOUNTER — Ambulatory Visit (HOSPITAL_COMMUNITY): Payer: Medicaid Other | Admitting: Vascular Surgery

## 2018-11-14 ENCOUNTER — Encounter (HOSPITAL_COMMUNITY): Payer: Self-pay

## 2018-11-14 ENCOUNTER — Encounter (HOSPITAL_COMMUNITY)
Admission: RE | Disposition: A | Discharge status: Home / Self Care | Payer: Self-pay | Source: Home / Self Care | Attending: Student

## 2018-11-14 ENCOUNTER — Ambulatory Visit (HOSPITAL_COMMUNITY): Payer: Medicaid Other

## 2018-11-14 ENCOUNTER — Observation Stay (HOSPITAL_COMMUNITY)
Admission: RE | Admit: 2018-11-14 | Discharge: 2018-11-16 | Disposition: A | Discharge status: Home / Self Care | Payer: Medicaid Other | Attending: Student | Admitting: Student

## 2018-11-14 DIAGNOSIS — Z8614 Personal history of Methicillin resistant Staphylococcus aureus infection: Secondary | ICD-10-CM | POA: Diagnosis not present

## 2018-11-14 DIAGNOSIS — X58XXXD Exposure to other specified factors, subsequent encounter: Secondary | ICD-10-CM | POA: Insufficient documentation

## 2018-11-14 DIAGNOSIS — Z79899 Other long term (current) drug therapy: Secondary | ICD-10-CM | POA: Insufficient documentation

## 2018-11-14 DIAGNOSIS — R569 Unspecified convulsions: Secondary | ICD-10-CM | POA: Diagnosis not present

## 2018-11-14 DIAGNOSIS — F17210 Nicotine dependence, cigarettes, uncomplicated: Secondary | ICD-10-CM | POA: Insufficient documentation

## 2018-11-14 DIAGNOSIS — F319 Bipolar disorder, unspecified: Secondary | ICD-10-CM | POA: Insufficient documentation

## 2018-11-14 DIAGNOSIS — D62 Acute posthemorrhagic anemia: Secondary | ICD-10-CM | POA: Diagnosis not present

## 2018-11-14 DIAGNOSIS — S82202K Unspecified fracture of shaft of left tibia, subsequent encounter for closed fracture with nonunion: Secondary | ICD-10-CM | POA: Diagnosis not present

## 2018-11-14 DIAGNOSIS — T148XXA Other injury of unspecified body region, initial encounter: Secondary | ICD-10-CM

## 2018-11-14 DIAGNOSIS — K219 Gastro-esophageal reflux disease without esophagitis: Secondary | ICD-10-CM | POA: Diagnosis not present

## 2018-11-14 DIAGNOSIS — S82202N Unspecified fracture of shaft of left tibia, subsequent encounter for open fracture type IIIA, IIIB, or IIIC with nonunion: Secondary | ICD-10-CM | POA: Diagnosis present

## 2018-11-14 DIAGNOSIS — S82222B Displaced transverse fracture of shaft of left tibia, initial encounter for open fracture type I or II: Secondary | ICD-10-CM

## 2018-11-14 DIAGNOSIS — Z419 Encounter for procedure for purposes other than remedying health state, unspecified: Secondary | ICD-10-CM

## 2018-11-14 HISTORY — PX: TIBIA IM NAIL INSERTION: SHX2516

## 2018-11-14 HISTORY — DX: Polyneuropathy, unspecified: G62.9

## 2018-11-14 LAB — CBC
HCT: 36.8 % (ref 36.0–46.0)
Hemoglobin: 11.7 g/dL — ABNORMAL LOW (ref 12.0–15.0)
MCH: 29.4 pg (ref 26.0–34.0)
MCHC: 31.8 g/dL (ref 30.0–36.0)
MCV: 92.5 fL (ref 80.0–100.0)
Platelets: 259 10*3/uL (ref 150–400)
RBC: 3.98 MIL/uL (ref 3.87–5.11)
RDW: 12.9 % (ref 11.5–15.5)
WBC: 4.5 10*3/uL (ref 4.0–10.5)
nRBC: 0 % (ref 0.0–0.2)

## 2018-11-14 LAB — POCT PREGNANCY, URINE: Preg Test, Ur: NEGATIVE

## 2018-11-14 SURGERY — INSERTION, INTRAMEDULLARY ROD, TIBIA
Anesthesia: General | Site: Leg Lower | Laterality: Left

## 2018-11-14 MED ORDER — HYDROMORPHONE HCL 1 MG/ML IJ SOLN
1.0000 mg | INTRAMUSCULAR | Status: DC | PRN
  Administered 2018-11-14: 1 mg via INTRAVENOUS
  Filled 2018-11-14: qty 1

## 2018-11-14 MED ORDER — FENTANYL CITRATE (PF) 250 MCG/5ML IJ SOLN
INTRAMUSCULAR | Status: AC
  Filled 2018-11-14: qty 5

## 2018-11-14 MED ORDER — ONDANSETRON HCL 4 MG/2ML IJ SOLN
INTRAMUSCULAR | Status: DC | PRN
  Administered 2018-11-14: 4 mg via INTRAVENOUS

## 2018-11-14 MED ORDER — DOCUSATE SODIUM 100 MG PO CAPS
100.0000 mg | ORAL_CAPSULE | Freq: Two times a day (BID) | ORAL | Status: DC
  Administered 2018-11-14 – 2018-11-16 (×5): 100 mg via ORAL
  Filled 2018-11-14 (×5): qty 1

## 2018-11-14 MED ORDER — METOCLOPRAMIDE HCL 5 MG PO TABS
5.0000 mg | ORAL_TABLET | Freq: Three times a day (TID) | ORAL | Status: DC | PRN
  Administered 2018-11-15: 5 mg via ORAL
  Filled 2018-11-14: qty 2

## 2018-11-14 MED ORDER — TOBRAMYCIN SULFATE 1.2 G IJ SOLR
INTRAMUSCULAR | Status: DC | PRN
  Administered 2018-11-14: 1.2 g via TOPICAL

## 2018-11-14 MED ORDER — PROPOFOL 10 MG/ML IV BOLUS
INTRAVENOUS | Status: AC
  Filled 2018-11-14: qty 20

## 2018-11-14 MED ORDER — OXYCODONE HCL 5 MG PO TABS
10.0000 mg | ORAL_TABLET | ORAL | Status: DC | PRN
  Administered 2018-11-14: 15 mg via ORAL
  Administered 2018-11-14: 11:00:00 10 mg via ORAL
  Administered 2018-11-14 (×2): 15 mg via ORAL
  Administered 2018-11-15: 09:00:00 10 mg via ORAL
  Filled 2018-11-14 (×3): qty 3

## 2018-11-14 MED ORDER — LIDOCAINE 2% (20 MG/ML) 5 ML SYRINGE
INTRAMUSCULAR | Status: DC | PRN
  Administered 2018-11-14: 100 mg via INTRAVENOUS

## 2018-11-14 MED ORDER — ACETAMINOPHEN 500 MG PO TABS
1000.0000 mg | ORAL_TABLET | Freq: Once | ORAL | Status: AC
  Administered 2018-11-14: 07:00:00 1000 mg via ORAL

## 2018-11-14 MED ORDER — FENTANYL CITRATE (PF) 100 MCG/2ML IJ SOLN: 25.0000 ug | INTRAMUSCULAR | Status: DC | PRN

## 2018-11-14 MED ORDER — HYDROMORPHONE HCL 1 MG/ML IJ SOLN
0.2500 mg | INTRAMUSCULAR | Status: DC | PRN
  Administered 2018-11-14 (×2): 0.5 mg via INTRAVENOUS

## 2018-11-14 MED ORDER — HYDROMORPHONE HCL 1 MG/ML IJ SOLN
INTRAMUSCULAR | Status: DC | PRN
  Administered 2018-11-14 (×2): .25 mg via INTRAVENOUS

## 2018-11-14 MED ORDER — HYDROMORPHONE HCL 1 MG/ML IJ SOLN
INTRAMUSCULAR | Status: AC
  Administered 2018-11-15: 12:00:00 1 mg via INTRAVENOUS
  Filled 2018-11-14: qty 1

## 2018-11-14 MED ORDER — HYDROMORPHONE HCL 1 MG/ML IJ SOLN
1.0000 mg | INTRAMUSCULAR | Status: DC | PRN
  Administered 2018-11-14 – 2018-11-15 (×5): 1 mg via INTRAVENOUS
  Filled 2018-11-14 (×5): qty 1

## 2018-11-14 MED ORDER — METOCLOPRAMIDE HCL 5 MG/ML IJ SOLN: 5.0000 mg | Freq: Three times a day (TID) | INTRAMUSCULAR | Status: DC | PRN

## 2018-11-14 MED ORDER — ASPIRIN EC 325 MG PO TBEC
325.0000 mg | DELAYED_RELEASE_TABLET | Freq: Every day | ORAL | Status: DC
  Administered 2018-11-15 – 2018-11-16 (×2): 325 mg via ORAL
  Filled 2018-11-14 (×2): qty 1

## 2018-11-14 MED ORDER — METHOCARBAMOL 500 MG PO TABS
500.0000 mg | ORAL_TABLET | Freq: Four times a day (QID) | ORAL | Status: DC | PRN
  Administered 2018-11-14 – 2018-11-16 (×3): 500 mg via ORAL
  Filled 2018-11-14 (×4): qty 1

## 2018-11-14 MED ORDER — VANCOMYCIN HCL 1000 MG IV SOLR
INTRAVENOUS | Status: AC
  Filled 2018-11-14: qty 1000

## 2018-11-14 MED ORDER — METHOCARBAMOL 1000 MG/10ML IJ SOLN
500.0000 mg | Freq: Four times a day (QID) | INTRAVENOUS | Status: DC | PRN
  Filled 2018-11-14: qty 5

## 2018-11-14 MED ORDER — CLINDAMYCIN PHOSPHATE 900 MG/50ML IV SOLN
900.0000 mg | Freq: Three times a day (TID) | INTRAVENOUS | Status: AC
  Administered 2018-11-14 – 2018-11-15 (×3): 900 mg via INTRAVENOUS
  Filled 2018-11-14 (×3): qty 50

## 2018-11-14 MED ORDER — GABAPENTIN 400 MG PO CAPS
400.0000 mg | ORAL_CAPSULE | Freq: Three times a day (TID) | ORAL | Status: DC
  Administered 2018-11-14 – 2018-11-16 (×7): 400 mg via ORAL
  Filled 2018-11-14 (×7): qty 1

## 2018-11-14 MED ORDER — LACTATED RINGERS IV SOLN
INTRAVENOUS | Status: DC | PRN
  Administered 2018-11-14: 07:00:00 via INTRAVENOUS

## 2018-11-14 MED ORDER — ONDANSETRON HCL 4 MG/2ML IJ SOLN: 4.0000 mg | Freq: Four times a day (QID) | INTRAMUSCULAR | Status: DC | PRN

## 2018-11-14 MED ORDER — PROPOFOL 10 MG/ML IV BOLUS
INTRAVENOUS | Status: DC | PRN
  Administered 2018-11-14: 200 mg via INTRAVENOUS

## 2018-11-14 MED ORDER — ONDANSETRON HCL 4 MG PO TABS: 4.0000 mg | ORAL_TABLET | Freq: Four times a day (QID) | ORAL | Status: DC | PRN

## 2018-11-14 MED ORDER — MIDAZOLAM HCL 5 MG/5ML IJ SOLN
INTRAMUSCULAR | Status: DC | PRN
  Administered 2018-11-14: 2 mg via INTRAVENOUS

## 2018-11-14 MED ORDER — CHLORHEXIDINE GLUCONATE 4 % EX LIQD: 60.0000 mL | Freq: Once | CUTANEOUS | Status: DC

## 2018-11-14 MED ORDER — POTASSIUM CHLORIDE IN NACL 20-0.9 MEQ/L-% IV SOLN
INTRAVENOUS | Status: DC
  Administered 2018-11-14: 11:00:00 via INTRAVENOUS
  Filled 2018-11-14: qty 1000

## 2018-11-14 MED ORDER — MIDAZOLAM HCL 2 MG/2ML IJ SOLN
INTRAMUSCULAR | Status: AC
  Filled 2018-11-14: qty 2

## 2018-11-14 MED ORDER — DIVALPROEX SODIUM 125 MG PO CSDR
250.0000 mg | DELAYED_RELEASE_CAPSULE | Freq: Two times a day (BID) | ORAL | Status: DC
  Administered 2018-11-14 – 2018-11-16 (×4): 250 mg via ORAL
  Filled 2018-11-14 (×5): qty 2

## 2018-11-14 MED ORDER — PANTOPRAZOLE SODIUM 40 MG PO TBEC
40.0000 mg | DELAYED_RELEASE_TABLET | Freq: Every day | ORAL | Status: DC
  Administered 2018-11-14 – 2018-11-16 (×3): 40 mg via ORAL
  Filled 2018-11-14 (×3): qty 1

## 2018-11-14 MED ORDER — HYDROMORPHONE HCL 1 MG/ML IJ SOLN
INTRAMUSCULAR | Status: AC
  Filled 2018-11-14: qty 0.5

## 2018-11-14 MED ORDER — ACETAMINOPHEN 500 MG PO TABS
ORAL_TABLET | ORAL | Status: AC
  Administered 2018-11-14: 07:00:00 1000 mg via ORAL
  Filled 2018-11-14: qty 2

## 2018-11-14 MED ORDER — DEXAMETHASONE SODIUM PHOSPHATE 10 MG/ML IJ SOLN
INTRAMUSCULAR | Status: DC | PRN
  Administered 2018-11-14: 10 mg via INTRAVENOUS

## 2018-11-14 MED ORDER — KETOROLAC TROMETHAMINE 15 MG/ML IJ SOLN
15.0000 mg | Freq: Four times a day (QID) | INTRAMUSCULAR | Status: AC
  Administered 2018-11-14 – 2018-11-15 (×5): 15 mg via INTRAVENOUS
  Filled 2018-11-14 (×5): qty 1

## 2018-11-14 MED ORDER — 0.9 % SODIUM CHLORIDE (POUR BTL) OPTIME
TOPICAL | Status: DC | PRN
  Administered 2018-11-14: 08:00:00 1000 mL

## 2018-11-14 MED ORDER — ACETAMINOPHEN 500 MG PO TABS
1000.0000 mg | ORAL_TABLET | Freq: Once | ORAL | Status: AC
  Administered 2018-11-14: 20:00:00 1000 mg via ORAL
  Filled 2018-11-14: qty 2

## 2018-11-14 MED ORDER — OXYCODONE HCL 5 MG PO TABS
5.0000 mg | ORAL_TABLET | ORAL | Status: DC | PRN
  Filled 2018-11-14 (×2): qty 2

## 2018-11-14 MED ORDER — CLINDAMYCIN PHOSPHATE 900 MG/50ML IV SOLN
900.0000 mg | INTRAVENOUS | Status: AC
  Administered 2018-11-14: 900 mg via INTRAVENOUS
  Filled 2018-11-14: qty 50

## 2018-11-14 MED ORDER — VITAMIN D 25 MCG (1000 UNIT) PO TABS
2000.0000 [IU] | ORAL_TABLET | Freq: Two times a day (BID) | ORAL | Status: DC
  Administered 2018-11-14 – 2018-11-16 (×5): 2000 [IU] via ORAL
  Filled 2018-11-14 (×5): qty 2

## 2018-11-14 MED ORDER — TOBRAMYCIN SULFATE 1.2 G IJ SOLR
INTRAMUSCULAR | Status: AC
  Filled 2018-11-14: qty 1.2

## 2018-11-14 MED ORDER — FENTANYL CITRATE (PF) 250 MCG/5ML IJ SOLN
INTRAMUSCULAR | Status: DC | PRN
  Administered 2018-11-14: 50 ug via INTRAVENOUS

## 2018-11-14 MED ORDER — ACETAMINOPHEN 325 MG PO TABS
650.0000 mg | ORAL_TABLET | Freq: Four times a day (QID) | ORAL | Status: DC
  Administered 2018-11-15: 06:00:00 650 mg via ORAL
  Filled 2018-11-14: qty 2

## 2018-11-14 SURGICAL SUPPLY — 59 items
BANDAGE ACE 6X5 VEL STRL LF (GAUZE/BANDAGES/DRESSINGS) ×3 IMPLANT
BIT DRILL CALIBRATED 4.2 (BIT) IMPLANT
BIT DRILL SHORT 4.2 (BIT) IMPLANT
BLADE SURG 10 STRL SS (BLADE) ×6 IMPLANT
BNDG COHESIVE 4X5 TAN STRL (GAUZE/BANDAGES/DRESSINGS) ×3 IMPLANT
BNDG ELASTIC 4X5.8 VLCR STR LF (GAUZE/BANDAGES/DRESSINGS) ×3 IMPLANT
BNDG ELASTIC 6X10 VLCR STRL LF (GAUZE/BANDAGES/DRESSINGS) ×2 IMPLANT
BNDG GAUZE ELAST 4 BULKY (GAUZE/BANDAGES/DRESSINGS) ×3 IMPLANT
BRUSH SCRUB EZ PLAIN DRY (MISCELLANEOUS) ×6 IMPLANT
CHLORAPREP W/TINT 26 (MISCELLANEOUS) ×3 IMPLANT
CLOSURE STERI-STRIP 1/2X4 (GAUZE/BANDAGES/DRESSINGS) ×1
CLSR STERI-STRIP ANTIMIC 1/2X4 (GAUZE/BANDAGES/DRESSINGS) ×1 IMPLANT
COVER SURGICAL LIGHT HANDLE (MISCELLANEOUS) ×6 IMPLANT
COVER WAND RF STERILE (DRAPES) ×3 IMPLANT
DRAPE C-ARM 42X72 X-RAY (DRAPES) ×3 IMPLANT
DRAPE C-ARMOR (DRAPES) ×3 IMPLANT
DRAPE HALF SHEET 40X57 (DRAPES) ×6 IMPLANT
DRAPE IMP U-DRAPE 54X76 (DRAPES) ×6 IMPLANT
DRAPE INCISE IOBAN 66X45 STRL (DRAPES) IMPLANT
DRAPE ORTHO SPLIT 77X108 STRL (DRAPES) ×4
DRAPE SURG ORHT 6 SPLT 77X108 (DRAPES) ×2 IMPLANT
DRAPE U-SHAPE 47X51 STRL (DRAPES) ×3 IMPLANT
DRILL BIT CALIBRATED 4.2 (BIT) ×3
DRILL BIT SHORT 4.2 (BIT) ×2
DRSG ADAPTIC 3X8 NADH LF (GAUZE/BANDAGES/DRESSINGS) ×3 IMPLANT
ELECT REM PT RETURN 9FT ADLT (ELECTROSURGICAL) ×3
ELECTRODE REM PT RTRN 9FT ADLT (ELECTROSURGICAL) ×1 IMPLANT
GAUZE SPONGE 4X4 12PLY STRL (GAUZE/BANDAGES/DRESSINGS) ×3 IMPLANT
GLOVE BIO SURGEON STRL SZ 6.5 (GLOVE) ×6 IMPLANT
GLOVE BIO SURGEON STRL SZ7.5 (GLOVE) ×12 IMPLANT
GLOVE BIO SURGEONS STRL SZ 6.5 (GLOVE) ×3
GLOVE BIOGEL PI IND STRL 6.5 (GLOVE) ×1 IMPLANT
GLOVE BIOGEL PI IND STRL 7.5 (GLOVE) ×1 IMPLANT
GLOVE BIOGEL PI INDICATOR 6.5 (GLOVE) ×2
GLOVE BIOGEL PI INDICATOR 7.5 (GLOVE) ×2
GOWN STRL REUS W/ TWL LRG LVL3 (GOWN DISPOSABLE) ×2 IMPLANT
GOWN STRL REUS W/TWL LRG LVL3 (GOWN DISPOSABLE) ×4
GUIDEWIRE 3.2X400 (WIRE) ×2 IMPLANT
KIT BASIN OR (CUSTOM PROCEDURE TRAY) ×3 IMPLANT
KIT TURNOVER KIT B (KITS) ×3 IMPLANT
NAIL CANN TI PROX TIB 10X360 (Nail) ×2 IMPLANT
PACK TOTAL JOINT (CUSTOM PROCEDURE TRAY) ×3 IMPLANT
PAD ARMBOARD 7.5X6 YLW CONV (MISCELLANEOUS) ×6 IMPLANT
PAD CAST 4YDX4 CTTN HI CHSV (CAST SUPPLIES) IMPLANT
PADDING CAST COTTON 4X4 STRL (CAST SUPPLIES) ×2
PADDING CAST COTTON 6X4 STRL (CAST SUPPLIES) ×2 IMPLANT
REAMER ROD DEEP FLUTE 2.5X950 (INSTRUMENTS) ×2 IMPLANT
SCREW LOCK STAR 5X30 (Screw) ×2 IMPLANT
SCREW LOCK STAR 5X32 (Screw) ×2 IMPLANT
SCREW LOCK STAR 5X38 (Screw) ×2 IMPLANT
STAPLER VISISTAT 35W (STAPLE) ×3 IMPLANT
SUT MNCRL AB 3-0 PS2 18 (SUTURE) ×3 IMPLANT
SUT VIC AB 0 CT1 27 (SUTURE)
SUT VIC AB 0 CT1 27XBRD ANBCTR (SUTURE) IMPLANT
SUT VIC AB 2-0 CT1 27 (SUTURE)
SUT VIC AB 2-0 CT1 TAPERPNT 27 (SUTURE) IMPLANT
TOWEL GREEN STERILE (TOWEL DISPOSABLE) ×6 IMPLANT
TOWEL GREEN STERILE FF (TOWEL DISPOSABLE) ×3 IMPLANT
YANKAUER SUCT BULB TIP NO VENT (SUCTIONS) IMPLANT
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
## 2018-11-14 NOTE — Anesthesia Procedure Notes (Signed)
Procedure Name: LMA Insertion Date/Time: 11/14/2018 7:45 AM Performed by: Cleda Daub, CRNA Pre-anesthesia Checklist: Patient identified, Emergency Drugs available, Suction available and Timeout performed Patient Re-evaluated:Patient Re-evaluated prior to induction Oxygen Delivery Method: Circle system utilized Preoxygenation: Pre-oxygenation with 100% oxygen Induction Type: IV induction LMA: LMA inserted LMA Size: 4.0 Number of attempts: 1 Placement Confirmation: positive ETCO2 Tube secured with: Tape Dental Injury: Teeth and Oropharynx as per pre-operative assessment

## 2018-11-14 NOTE — Transfer of Care (Signed)
Immediate Anesthesia Transfer of Care Note  Patient: [name]  Procedure(s) Performed: EXCHANGE NAILING OF LEFT TIBIAL NONUNION (Left Leg Lower)  Patient Location: PACU  Anesthesia Type:General  Level of Consciousness: awake, alert , oriented and patient cooperative  Airway & Oxygen Therapy: Patient Spontanous Breathing and Patient connected to face mask oxygen  Post-op Assessment: Report given to RN and Post -op Vital signs reviewed and stable  Post vital signs: Reviewed and stable  Last Vitals:  Vitals Value Taken Time  BP 119/73 11/14/18 0938  Temp    Pulse 106 11/14/18 0942  Resp 12 11/14/18 0942  SpO2 100 % 11/14/18 0942  Vitals shown include unvalidated device data.  Last Pain:  Vitals:   11/14/18 0616  TempSrc:   PainSc: 0-No pain      Patients Stated Pain Goal: 2 (51/02/58 5277)  Complications: No apparent anesthesia complications
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
## 2018-11-15 ENCOUNTER — Encounter (HOSPITAL_COMMUNITY): Payer: Self-pay | Admitting: Student

## 2018-11-15 DIAGNOSIS — S82202K Unspecified fracture of shaft of left tibia, subsequent encounter for closed fracture with nonunion: Secondary | ICD-10-CM | POA: Diagnosis not present

## 2018-11-15 LAB — BASIC METABOLIC PANEL
Anion gap: 8 (ref 5–15)
BUN: 22 mg/dL — ABNORMAL HIGH (ref 6–20)
CO2: 26 mmol/L (ref 22–32)
Calcium: 8.1 mg/dL — ABNORMAL LOW (ref 8.9–10.3)
Chloride: 106 mmol/L (ref 98–111)
Creatinine, Ser: 0.71 mg/dL (ref 0.44–1.00)
GFR calc Af Amer: >60 mL/min (ref >60)
GFR calc non Af Amer: >60 mL/min (ref >60)
Glucose, Bld: 143 mg/dL — ABNORMAL HIGH (ref 70–99)
Potassium: 3.7 mmol/L (ref 3.5–5.1)
Sodium: 140 mmol/L (ref 135–145)

## 2018-11-15 LAB — CBC
HCT: 28.2 % — ABNORMAL LOW (ref 36.0–46.0)
Hemoglobin: 9.2 g/dL — ABNORMAL LOW (ref 12.0–15.0)
MCH: 29.1 pg (ref 26.0–34.0)
MCHC: 32.6 g/dL (ref 30.0–36.0)
MCV: 89.2 fL (ref 80.0–100.0)
Platelets: 211 10*3/uL (ref 150–400)
RBC: 3.16 MIL/uL — ABNORMAL LOW (ref 3.87–5.11)
RDW: 12.8 % (ref 11.5–15.5)
WBC: 7.8 10*3/uL (ref 4.0–10.5)
nRBC: 0 % (ref 0.0–0.2)

## 2018-11-15 MED ORDER — OXYCODONE HCL 5 MG PO TABS
20.0000 mg | ORAL_TABLET | ORAL | Status: DC | PRN
  Administered 2018-11-15 – 2018-11-16 (×5): 20 mg via ORAL
  Filled 2018-11-15 (×5): qty 4

## 2018-11-15 MED ORDER — KETOROLAC TROMETHAMINE 15 MG/ML IJ SOLN
15.0000 mg | Freq: Four times a day (QID) | INTRAMUSCULAR | Status: DC
  Administered 2018-11-15 – 2018-11-16 (×3): 15 mg via INTRAVENOUS
  Filled 2018-11-15 (×3): qty 1

## 2018-11-15 MED ORDER — ACETAMINOPHEN 500 MG PO TABS
1000.0000 mg | ORAL_TABLET | Freq: Four times a day (QID) | ORAL | Status: DC
  Administered 2018-11-15 – 2018-11-16 (×4): 1000 mg via ORAL
  Filled 2018-11-15 (×5): qty 2

## 2018-11-15 NOTE — Progress Notes (Signed)
The patient has slept most of the day and awakens easily for Scheduled medications.  Left leg remains elevated up on a pillow.  Ice packed applied.  The patient reports that the change in her pain medication has helped.

## 2018-11-15 NOTE — Discharge Instructions (Addendum)
Orthopaedic [facility] Discharge Instructions   General Discharge Instructions  WEIGHT BEARING STATUS: Weightbearing as tolerated on left leg  RANGE OF MOTION/ACTIVITY: Unrestricted range of motion of left knee and ankle  Wound Care: Leave the steri-strips in place, they will fall off on their own.  Incisions can be left open to air if there is no drainage. Okay to shower and get steri-strips and incisions wet.  DVT/PE prophylaxis: Aspirin 325 mg daily  Diet: as you were eating previously.  Can use over the counter stool softeners and bowel preparations, such as Miralax, to help with bowel movements.  Narcotics can be constipating.  Be sure to drink plenty of fluids  PAIN MEDICATION USE AND EXPECTATIONS  You have likely been given narcotic medications to help control your pain.  After a traumatic event that results in an fracture (broken bone) with or without surgery, it is ok to use narcotic pain medications to help control one's pain.  We understand that everyone responds to pain differently and each individual patient will be evaluated on a regular basis for the continued need for narcotic medications. Ideally, narcotic medication use should last no more than 6-8 weeks (coinciding with fracture healing).   As a patient it is your responsibility as well to monitor narcotic medication use and report the amount and frequency you use these medications when you come to your office visit.   We would also advise that if you are using narcotic medications, you should take a dose prior to therapy to maximize you participation.  IF YOU ARE ON NARCOTIC MEDICATIONS IT IS NOT PERMISSIBLE TO OPERATE A MOTOR VEHICLE (MOTORCYCLE/CAR/TRUCK/MOPED) OR HEAVY MACHINERY DO NOT MIX NARCOTICS WITH OTHER CNS (CENTRAL NERVOUS SYSTEM) DEPRESSANTS SUCH AS ALCOHOL   STOP SMOKING OR USING NICOTINE PRODUCTS!!!!  As discussed nicotine severely impairs your body's ability to heal surgical and traumatic wounds but  also impairs bone healing.  Wounds and bone heal by forming microscopic blood vessels (angiogenesis) and nicotine is a vasoconstrictor (essentially, shrinks blood vessels).  Therefore, if vasoconstriction occurs to these microscopic blood vessels they essentially disappear and are unable to deliver necessary nutrients to the healing tissue.  This is one modifiable factor that you can do to dramatically increase your chances of healing your injury.    (This means no smoking, no nicotine gum, patches, etc)  DO NOT USE NONSTEROIDAL ANTI-INFLAMMATORY DRUGS (NSAID'S)  Using products such as Advil (ibuprofen), Aleve (naproxen), Motrin (ibuprofen) for additional pain control during fracture healing can delay and/or prevent the healing response.  If you would like to take over the counter (OTC) medication, Tylenol (acetaminophen) is ok.  However, some narcotic medications that are given for pain control contain acetaminophen as well. Therefore, you should not exceed more than 4000 mg of tylenol in a day if you do not have liver disease.  Also note that there are may OTC medicines, such as cold medicines and allergy medicines that my contain tylenol as well.  If you have any questions about medications and/or interactions please ask your doctor/PA or your pharmacist.      ICE AND ELEVATE INJURED/OPERATIVE EXTREMITY  Using ice and elevating the injured extremity above your heart can help with swelling and pain control.  Icing in a pulsatile fashion, such as 20 minutes on and 20 minutes off, can be followed.    Do not place ice directly on skin. Make sure there is a barrier between to skin and the ice pack.    Using  frozen items such as frozen peas works well as the conform nicely to the are that needs to be iced.  USE AN ACE WRAP OR TED HOSE FOR SWELLING CONTROL  In addition to icing and elevation, Ace wraps or TED hose are used to help limit and resolve swelling.  It is recommended to use Ace wraps or TED hose  until you are informed to stop.    When using Ace Wraps start the wrapping distally (farthest away from the body) and wrap proximally (closer to the body)   Example: If you had surgery on your leg or thing and you do not have a splint on, start the ace wrap at the toes and work your way up to the thigh        If you had surgery on your upper extremity and do not have a splint on, start the ace wrap at your fingers and work your way up to the upper arm   CALL THE OFFICE WITH ANY QUESTIONS OR CONCERNS: [phone]   VISIT OUR WEBSITE FOR ADDITIONAL INFORMATION: [email or web address]     Discharge Wound Care Instructions  Do NOT apply any ointments, solutions or lotions to pin sites or surgical wounds.  These prevent needed drainage and even though solutions like hydrogen peroxide kill bacteria, they also damage cells lining the pin sites that help fight infection.  Applying lotions or ointments can keep the wounds moist and can cause them to breakdown and open up as well. This can increase the risk for infection. When in doubt call the office.  Surgical incisions should be dressed daily.  If any drainage is noted, use one layer of adaptic, then gauze, Kerlix, and an ace wrap.  Once the incision is completely dry and without drainage, it may be left open to air out.  Showering may begin 36-48 hours later.  Cleaning gently with soap and water.  Traumatic wounds should be dressed daily as well.    One layer of adaptic, gauze, Kerlix, then ace wrap.  The adaptic can be discontinued once the draining has ceased    If you have a wet to dry dressing: wet the gauze with saline the squeeze as much saline out so the gauze is moist (not soaking wet), place moistened gauze over wound, then place a dry gauze over the moist one, followed by Kerlix wrap, then ace wrap.
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
## 2018-11-15 NOTE — Progress Notes (Signed)
The patient's mother called and was given an update on the patient's status today.  Her mother was asking about a discharge order- she was made aware that a discharge order has not been placed.

## 2018-11-15 NOTE — Progress Notes (Signed)
Orthopaedic Trauma Progress Note  S: Patient doing okay this morning, having significant amount of pain in her leg. Rates her pain about 9 out of 10 currently. Pain medications helping some but not a ton. Noticed no difference in pain control with addition of Toradol yesterday. Will work on adjusting medications this morning to hopefully get better pain control. If pain improves, patient would like to go home this afternoon.   O:  Vitals:   11/15/18 0339 11/15/18 0841  BP: (!) 109/58 (!) 110/59  Pulse: 89 (!) 101  Resp: 17 17  Temp: 98.2 F (36.8 C) 98.1 F (36.7 C)  SpO2: 99% 99%    General - Laying in bed, NAD. Pleasant and cooperative   Respiratory - No increased work of breathing  Left Lower Extremity - Dressing in place is clean, dry, intact. Tenderness with palpation of knee and lower leg. Non-tender in thigh. Knee flexion limited secondary to pain. Dorsiflexion/plantarflexion intact but limited secondary to pain. Able to wiggle toes. Compartments soft and compressible. Toes warm and well perfused. +DP pulse  Imaging: Stable post op imaging.   Labs:  Results for orders placed or performed during the hospital encounter of 11/14/18 (from the past 24 hour(s))  CBC     Status: Abnormal   Collection Time: 11/15/18  3:45 AM  Result Value Ref Range   WBC 7.8 4.0 - 10.5 K/uL   RBC 3.16 (L) 3.87 - 5.11 MIL/uL   Hemoglobin 9.2 (L) 12.0 - 15.0 g/dL   HCT 28.2 (L) 36.0 - 46.0 %   MCV 89.2 80.0 - 100.0 fL   MCH 29.1 26.0 - 34.0 pg   MCHC 32.6 30.0 - 36.0 g/dL   RDW 12.8 11.5 - 15.5 %   Platelets 211 150 - 400 K/uL   nRBC 0.0 0.0 - 0.2 %  Basic metabolic panel     Status: Abnormal   Collection Time: 11/15/18  3:45 AM  Result Value Ref Range   Sodium 140 135 - 145 mmol/L   Potassium 3.7 3.5 - 5.1 mmol/L   Chloride 106 98 - 111 mmol/L   CO2 26 22 - 32 mmol/L   Glucose, Bld 143 (H) 70 - 99 mg/dL   BUN 22 (H) 6 - 20 mg/dL   Creatinine, Ser 0.71 0.44 - 1.00 mg/dL   Calcium 8.1 (L)  8.9 - 10.3 mg/dL   GFR calc non Af Amer >60 >60 mL/min   GFR calc Af Amer >60 >60 mL/min   Anion gap 8 5 - 15    Assessment: 38 year old female with left tibial nonunion s/p exchange nailing   Weightbearing: WBAT LLE  Insicional and dressing care: Dressing c/d/i. Plan to remove tomorrow and leave open to air  Orthopedic device(s): None  CV/Blood loss: Acute blood loss anemia, Hgb 9.2. Hemodynamically stable  Pain management:  1. Tylenol 1000 mg q 6 hours scheduled 2. Robaxin 500 mg q 6 hours PRN 3. Oxycodone 20 mg q 4 hours PRN 4. Neurontin 400 mg TID 5. Dilaudid 1 mg q 3 hours PRN 6. Toradol 15 mg q 6 hours x 5 doses  VTE prophylaxis: Aspirin 325 mg daily   ID: Clindamycin 900 mg post op completed  Foley/Lines:  No foley, KVO IVFs  Medical co-morbidities: None noted  Impediments to Fracture Healing: previous nonunion  Dispo: Adjusting pain med regimen to hopefully achieve better pain control. Up with therapy today. Possible discharge this afternoon vs tomorrow  Follow - up plan: 2 weeks  [place] Hijazi A. Ladonna SnideYacobi, PA-C [facility] ?[phone]? (phone)

## 2018-11-16 ENCOUNTER — Encounter (HOSPITAL_COMMUNITY): Payer: Self-pay | Admitting: Student

## 2018-11-16 DIAGNOSIS — S82202K Unspecified fracture of shaft of left tibia, subsequent encounter for closed fracture with nonunion: Secondary | ICD-10-CM | POA: Diagnosis not present

## 2018-11-16 LAB — BASIC METABOLIC PANEL
Anion gap: 7 (ref 5–15)
BUN: 22 mg/dL — ABNORMAL HIGH (ref 6–20)
CO2: 26 mmol/L (ref 22–32)
Calcium: 8.2 mg/dL — ABNORMAL LOW (ref 8.9–10.3)
Chloride: 110 mmol/L (ref 98–111)
Creatinine, Ser: 0.74 mg/dL (ref 0.44–1.00)
GFR calc Af Amer: >60 mL/min (ref >60)
GFR calc non Af Amer: >60 mL/min (ref >60)
Glucose, Bld: 96 mg/dL (ref 70–99)
Potassium: 4 mmol/L (ref 3.5–5.1)
Sodium: 143 mmol/L (ref 135–145)

## 2018-11-16 MED ORDER — ASPIRIN 325 MG PO TBEC: 325.0000 mg | DELAYED_RELEASE_TABLET | Freq: Every day | ORAL | 0 refills | Status: DC

## 2018-11-16 MED ORDER — OXYCODONE HCL 20 MG PO TABS: 20.0000 mg | ORAL_TABLET | ORAL | 0 refills | Status: AC | PRN

## 2018-11-16 MED ORDER — D3 HIGH POTENCY 125 MCG (5000 UT) PO CAPS: 5000.0000 [IU] | ORAL_CAPSULE | Freq: Every day | ORAL | 1 refills | Status: DC

## 2018-11-16 MED ORDER — METHOCARBAMOL 750 MG PO TABS: 750.0000 mg | ORAL_TABLET | Freq: Four times a day (QID) | ORAL | 0 refills | Status: DC | PRN

## 2018-11-16 NOTE — Progress Notes (Signed)
PT Cancellation Note  Patient Details Name: [name] MRN: [id number] DOB: [birth year]   Cancelled Treatment:    Reason Eval/Treat Not Completed: (P) Patient declined, no reason specified;Pain limiting ability to participate(Pt reports she is sore and stiff and refused PT at this time.  PTA educated on benefits of mobility and she remains to decline at this time,  Informed RN.)   Cristela Blue 11/16/2018, 11:32 AM Governor Rooks, PTA Acute Rehabilitation Services Pager [phone] Office [phone]

## 2018-11-16 NOTE — Discharge Summary (Signed)
Orthopaedic Trauma Service (OTS) Discharge Summary   Patient ID: [name] MRN: [id number] DOB/AGE: [birth year] 38 y.o.  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 Anxiety   . Asthma   . Asthma   . Bipolar disorder ([place])   . Complication of anesthesia    " I have a hard time waking & I cry "  . Depression    panic disorder  . Depression   . History of blood transfusion 08/2017  . MRSA (methicillin resistant staph aureus) culture positive 2013  . Neuropathy    left leg  . Seizures ([place])    last one 08/29/17  . Vaginal Pap smear, abnormal      Procedures Performed: 1. CPT 27720-Exchange nailing of left tibial nonunion 2. CPT 20680-Removal of hardware left tibia  Discharged Condition: good  Hospital Course: Patient presented to [facility] on 11/14/18 for repair of tibia nonunion. Pre-op covid test was negative. Was taken to operating room by Dr. Doreatha Martin for the above procedure. Tolerated procedure well. Patient made weightbearing as tolerated on left leg following surgery. Began working with physical therapy on range of motion and ambulation starting on day of surgery. Was started on Aspirin 325 mg daily for DVT prophylaxis starting on POD #1. Patient had a significant amount of pain in her left leg post-operatively and POD #1 was spent working to achieve better pain control.  On 11/16/2018, the patient was tolerating diet, working well with therapies, pain well controlled, vital signs stable, incisions clean, dry, intact and felt stable for discharge to home. Patient will follow up as below and knows to call with questions or concerns.     Consults: None   Results for orders  placed or performed during the hospital encounter of 11/14/18 (from the past 168 hour(s))  CBC   Collection Time: 11/14/18  6:09 AM  Result Value Ref Range   WBC 4.5 4.0 - 10.5 K/uL   RBC 3.98 3.87 - 5.11 MIL/uL   Hemoglobin 11.7 (L) 12.0 - 15.0 g/dL   HCT 36.8 36.0 - 46.0 %   MCV 92.5 80.0 - 100.0 fL   MCH 29.4 26.0 - 34.0 pg   MCHC 31.8 30.0 - 36.0 g/dL   RDW 12.9 11.5 - 15.5 %   Platelets 259 150 - 400 K/uL   nRBC 0.0 0.0 - 0.2 %  Pregnancy, urine POC   Collection Time: 11/14/18  6:31 AM  Result Value Ref Range   Preg Test, Ur NEGATIVE NEGATIVE  CBC   Collection Time: 11/15/18  3:45 AM  Result Value Ref Range   WBC 7.8 4.0 - 10.5 K/uL   RBC 3.16 (L) 3.87 - 5.11 MIL/uL   Hemoglobin 9.2 (L) 12.0 - 15.0 g/dL   HCT 28.2 (L) 36.0 - 46.0 %   MCV 89.2 80.0 - 100.0 fL   MCH 29.1 26.0 - 34.0 pg   MCHC 32.6 30.0 - 36.0 g/dL   RDW 12.8 11.5 - 15.5 %   Platelets 211 150 - 400 K/uL   nRBC 0.0 0.0 - 0.2 %  Basic metabolic panel   Collection Time: 11/15/18  3:45 AM  Result Value Ref Range   Sodium 140 135 - 145  mmol/L   Potassium 3.7 3.5 - 5.1 mmol/L   Chloride 106 98 - 111 mmol/L   CO2 26 22 - 32 mmol/L   Glucose, Bld 143 (H) 70 - 99 mg/dL   BUN 22 (H) 6 - 20 mg/dL   Creatinine, Ser 1.610.71 0.44 - 1.00 mg/dL   Calcium 8.1 (L) 8.9 - 10.3 mg/dL   GFR calc non Af Amer >60 >60 mL/min   GFR calc Af Amer >60 >60 mL/min   Anion gap 8 5 - 15  Basic metabolic panel   Collection Time: 11/16/18  5:55 AM  Result Value Ref Range   Sodium 143 135 - 145 mmol/L   Potassium 4.0 3.5 - 5.1 mmol/L   Chloride 110 98 - 111 mmol/L   CO2 26 22 - 32 mmol/L   Glucose, Bld 96 70 - 99 mg/dL   BUN 22 (H) 6 - 20 mg/dL   Creatinine, Ser 0.960.74 0.44 - 1.00 mg/dL   Calcium 8.2 (L) 8.9 - 10.3 mg/dL   GFR calc non Af Amer >60 >60 mL/min   GFR calc Af Amer >60 >60 mL/min   Anion gap 7 5 - 15  Results for orders placed or performed during the hospital encounter of 11/12/18 (from the past 168 hour(s))  SARS  Coronavirus 2 (Performed in [facility] lab)   Collection Time: 11/12/18 10:10 AM   Specimen: Nasal Swab  Result Value Ref Range   SARS Coronavirus 2 NEGATIVE NEGATIVE     Treatments: surgery: 1. CPT 27720-Exchange nailing of left tibial nonunion 2. CPT 20680-Removal of hardware left tibia  Discharge Exam: General - Laying in bed, NAD. Pleasant and cooperative   Cardiac - heart regular ate and rhythm Respiratory - No increased work of breathing. Lungs CTA anterior lung fields bilaterally  Left Lower Extremity - Dressing removed, incisions clean, dry, intact. Steri-strips in place. Tenderness with palpation of knee and lower leg. Non-tender in thigh. Knee flexion limited secondary to pain. Dorsiflexion/plantarflexion intact. Able to wiggle toes. Compartments soft and compressible. Foot warm and well perfused. +DP pulse  Disposition: Discharge disposition: 01-Home or Self Care       Allergies as of 11/16/2018      Reactions   Augmentin [amoxicillin-pot Clavulanate] Anaphylaxis, Hives, Rash   PATIENT HAS HAD A PCN REACTION WITH IMMEDIATE RASH, FACIAL/TONGUE/THROAT SWELLING, SOB, OR LIGHTHEADEDNESS WITH HYPOTENSION:  #  #  YES  #  #  Has patient had a PCN reaction causing severe rash involving mucus membranes or skin necrosis: No PATIENT HAS HAD A PCN REACTION THAT REQUIRED HOSPITALIZATION:  #  #  YES  #  #  Has patient had a PCN reaction occurring within the last 10 years: #  #  #  YES  #  #  # .   Vancomycin Hives, Itching   PATIENT HAS HAD A PCN REACTION WITH IMMEDIATE RASH, FACIAL/TONGUE/THROAT SWELLING, SOB, OR LIGHTHEADEDNESS WITH HYPOTENSION:  #  #  YES  #  #  HAS PT DEVELOPED SEVERE RASH INVOLVING MUCUS MEMBRANES or SKIN NECROSIS: #  #  YES  #  # Has patient had a PCN reaction that required hospitalization: #  #  #  YES  #  #  #  Was in hospital when happened in 2014  Has patient had a PCN reaction occurring within the last 10 years: #  #  #  YES  #  #     Azithromycin Hives   Erythromycin Nausea And Vomiting,  Rash   Sudafed [pseudoephedrine Hcl] Rash, Other (See Comments)   Makes heart race   Tramadol Nausea And Vomiting   "it makes me throw-up"   Zithromax [azithromycin] Nausea And Vomiting, Rash      Medication List    STOP taking these medications   cyclobenzaprine 10 MG tablet Commonly known as: FLEXERIL   HYDROcodone-acetaminophen 7.5-325 MG tablet Commonly known as: NORCO   senna-docusate 8.6-50 MG tablet Commonly known as: Senokot-S     TAKE these medications   aspirin 325 MG EC tablet Take 1 tablet (325 mg total) by mouth daily.   D3 High Potency 125 MCG (5000 UT) capsule Generic drug: Cholecalciferol Take 1 capsule (5,000 Units total) by mouth daily.   divalproex 125 MG capsule Commonly known as: DEPAKOTE SPRINKLE Take 2 capsules (250 mg total) by mouth every 12 (twelve) hours.   gabapentin 400 MG capsule Commonly known as: NEURONTIN Take 400 mg by mouth 3 (three) times daily.   methocarbamol 750 MG tablet Commonly known as: ROBAXIN Take 1 tablet (750 mg total) by mouth every 6 (six) hours as needed for muscle spasms.   Oxycodone HCl 20 MG Tabs Take 1 tablet (20 mg total) by mouth every 4 (four) hours as needed for up to 5 days for severe pain.   pantoprazole 40 MG tablet Commonly known as: PROTONIX Take 1 tablet (40 mg total) by mouth 2 (two) times daily.   Vitamin D (Ergocalciferol) 1.25 MG (50000 UT) Caps capsule Commonly known as: DRISDOL Take 50,000 Units by mouth every 7 (seven) days. Sunday   zolpidem 5 MG tablet Commonly known as: AMBIEN Take 5 mg by mouth at bedtime as needed for sleep.            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 Schedule an appointment as soon as possible for a visit in 2 week(s).   Specialty: Orthopedic Surgery Why: for repeat x-rays and wound check Contact information: [address] [place] [address] [phone]           Discharge Instructions and Plan: Patient will be discharged to home. Will be discharged on Aspirin for DVT prophylaxis. She will be weightbearing as tolerated on the left leg. Patient has been provided with all the necessary DME for discharge. Patient will follow up with Dr. Jena GaussHaddix in 2 weeks for repeat x-rays and wound check.   Signed:  Shawn RouteSarah A. Ladonna SnideYacobi, PA-C ?[phone]? (phone) 11/16/2018, 7:42 AM  [facility] [address] [place] [address] [phone](O) [phone] (F)

## 2018-11-16 NOTE — Anesthesia Postprocedure Evaluation (Signed)
Anesthesia Post Note  Patient: Karsten Fells  Procedure(s) Performed: EXCHANGE NAILING OF LEFT TIBIAL NONUNION (Left Leg Lower)     Patient location during evaluation: PACU Anesthesia Type: General Level of consciousness: awake and alert Pain management: pain level controlled Vital Signs Assessment: post-procedure vital signs reviewed and stable Respiratory status: spontaneous breathing, nonlabored ventilation, respiratory function stable and patient connected to nasal cannula oxygen Cardiovascular status: blood pressure returned to baseline and stable Postop Assessment: no apparent nausea or vomiting Anesthetic complications: no    Last Vitals:  Vitals:   11/16/18 0348 11/16/18 0837  BP: 118/80 107/68  Pulse: 76 87  Resp: 16 16  Temp: 36.6 C 36.6 C  SpO2:  100%    Last Pain:  Vitals:   11/16/18 0837  TempSrc: Oral  PainSc:    Pain Goal: Patients Stated Pain Goal: 3 (11/16/18 0556)                 Haywood Lasso L Gregori Abril

## 2018-11-16 NOTE — Progress Notes (Signed)
Written and verbal discharge instructions given.  A discharge packet has been given.  The patient has the necessary equipment for home.  The patient verbalizes understanding those instructions.  The patient will be discharged via wheelchair to her home, with her father.

## 2018-11-20 LAB — VITAMIN D 25 HYDROXY (VIT D DEFICIENCY, FRACTURES): Vit D, 25-Hydroxy: 35.6 ng/mL (ref 30.0–100.0)
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
## 2020-08-25 ENCOUNTER — Emergency Department (HOSPITAL_COMMUNITY): Payer: Medicaid Other

## 2020-08-25 ENCOUNTER — Emergency Department (HOSPITAL_COMMUNITY)
Admission: EM | Admit: 2020-08-25 | Discharge: 2020-08-27 | Disposition: A | Discharge status: Other Acute Inpatient Hospital | Payer: Medicaid Other | Source: Home / Self Care | Attending: Emergency Medicine | Admitting: Emergency Medicine

## 2020-08-25 ENCOUNTER — Other Ambulatory Visit: Payer: Self-pay

## 2020-08-25 DIAGNOSIS — Z20822 Contact with and (suspected) exposure to covid-19: Secondary | ICD-10-CM | POA: Insufficient documentation

## 2020-08-25 DIAGNOSIS — F319 Bipolar disorder, unspecified: Secondary | ICD-10-CM | POA: Insufficient documentation

## 2020-08-25 DIAGNOSIS — S1091XA Abrasion of unspecified part of neck, initial encounter: Secondary | ICD-10-CM | POA: Insufficient documentation

## 2020-08-25 DIAGNOSIS — F1110 Opioid abuse, uncomplicated: Secondary | ICD-10-CM | POA: Insufficient documentation

## 2020-08-25 DIAGNOSIS — R900 Intracranial space-occupying lesion found on diagnostic imaging of central nervous system: Secondary | ICD-10-CM | POA: Insufficient documentation

## 2020-08-25 DIAGNOSIS — X789XXA Intentional self-harm by unspecified sharp object, initial encounter: Secondary | ICD-10-CM | POA: Insufficient documentation

## 2020-08-25 DIAGNOSIS — T1491XA Suicide attempt, initial encounter: Secondary | ICD-10-CM

## 2020-08-25 DIAGNOSIS — Z7289 Other problems related to lifestyle: Secondary | ICD-10-CM

## 2020-08-25 DIAGNOSIS — F1910 Other psychoactive substance abuse, uncomplicated: Secondary | ICD-10-CM

## 2020-08-25 LAB — CBC WITH DIFFERENTIAL/PLATELET
Abs Immature Granulocytes: 0.01 10*3/uL (ref 0.00–0.07)
Basophils Absolute: 0 10*3/uL (ref 0.0–0.1)
Basophils Relative: 1 %
Eosinophils Absolute: 0.1 10*3/uL (ref 0.0–0.5)
Eosinophils Relative: 2 %
HCT: 34.4 % — ABNORMAL LOW (ref 36.0–46.0)
Hemoglobin: 11.1 g/dL — ABNORMAL LOW (ref 12.0–15.0)
Immature Granulocytes: 0 %
Lymphocytes Relative: 34 %
Lymphs Abs: 1.8 10*3/uL (ref 0.7–4.0)
MCH: 28.3 pg (ref 26.0–34.0)
MCHC: 32.3 g/dL (ref 30.0–36.0)
MCV: 87.8 fL (ref 80.0–100.0)
Monocytes Absolute: 0.4 10*3/uL (ref 0.1–1.0)
Monocytes Relative: 8 %
Neutro Abs: 2.8 10*3/uL (ref 1.7–7.7)
Neutrophils Relative %: 55 %
Platelets: 253 10*3/uL (ref 150–400)
RBC: 3.92 MIL/uL (ref 3.87–5.11)
RDW: 13.2 % (ref 11.5–15.5)
WBC: 5.2 10*3/uL (ref 4.0–10.5)
nRBC: 0 % (ref 0.0–0.2)

## 2020-08-25 LAB — URINALYSIS, ROUTINE W REFLEX MICROSCOPIC
Bacteria, UA: NONE SEEN
Bilirubin Urine: NEGATIVE
Glucose, UA: NEGATIVE mg/dL
Hgb urine dipstick: NEGATIVE
Ketones, ur: 80 mg/dL — AB
Nitrite: NEGATIVE
Protein, ur: NEGATIVE mg/dL
Specific Gravity, Urine: 1.026 (ref 1.005–1.030)
pH: 5 (ref 5.0–8.0)

## 2020-08-25 LAB — COMPREHENSIVE METABOLIC PANEL
ALT: 12 U/L (ref 0–44)
AST: 16 U/L (ref 15–41)
Albumin: 3.7 g/dL (ref 3.5–5.0)
Alkaline Phosphatase: 35 U/L — ABNORMAL LOW (ref 38–126)
Anion gap: 10 (ref 5–15)
BUN: 15 mg/dL (ref 6–20)
CO2: 24 mmol/L (ref 22–32)
Calcium: 8.6 mg/dL — ABNORMAL LOW (ref 8.9–10.3)
Chloride: 106 mmol/L (ref 98–111)
Creatinine, Ser: 0.68 mg/dL (ref 0.44–1.00)
GFR, Estimated: >60 mL/min (ref >60)
Glucose, Bld: 88 mg/dL (ref 70–99)
Potassium: 2.8 mmol/L — ABNORMAL LOW (ref 3.5–5.1)
Sodium: 140 mmol/L (ref 135–145)
Total Bilirubin: 0.7 mg/dL (ref 0.3–1.2)
Total Protein: 6.7 g/dL (ref 6.5–8.1)

## 2020-08-25 LAB — ETHANOL: Alcohol, Ethyl (B): <10 mg/dL (ref <10)

## 2020-08-25 LAB — RAPID URINE DRUG SCREEN, HOSP PERFORMED
Amphetamines: POSITIVE — AB
Barbiturates: NOT DETECTED
Benzodiazepines: POSITIVE — AB
Cocaine: POSITIVE — AB
Opiates: NOT DETECTED
Tetrahydrocannabinol: POSITIVE — AB

## 2020-08-25 LAB — PROTIME-INR
INR: 1.1 (ref 0.8–1.2)
Prothrombin Time: 13.9 seconds (ref 11.4–15.2)

## 2020-08-25 LAB — LACTIC ACID, PLASMA: Lactic Acid, Venous: 1 mmol/L (ref 0.5–1.9)

## 2020-08-25 LAB — RESP PANEL BY RT-PCR (FLU A&B, COVID) ARPGX2
Influenza A by PCR: NEGATIVE
Influenza B by PCR: NEGATIVE
SARS Coronavirus 2 by RT PCR: NEGATIVE

## 2020-08-25 LAB — ACETAMINOPHEN LEVEL: Acetaminophen (Tylenol), Serum: <10 ug/mL — ABNORMAL LOW (ref 10–30)

## 2020-08-25 LAB — TROPONIN I (HIGH SENSITIVITY)
Troponin I (High Sensitivity): <2 ng/L (ref <18)
Troponin I (High Sensitivity): <2 ng/L (ref <18)

## 2020-08-25 LAB — I-STAT BETA HCG BLOOD, ED (MC, WL, AP ONLY): I-stat hCG, quantitative: <5 m[IU]/mL (ref <5)

## 2020-08-25 LAB — SAMPLE TO BLOOD BANK

## 2020-08-25 LAB — SALICYLATE LEVEL: Salicylate Lvl: <7 mg/dL — ABNORMAL LOW (ref 7.0–30.0)

## 2020-08-25 MED ORDER — ACETAMINOPHEN 325 MG PO TABS: 650.0000 mg | ORAL_TABLET | ORAL | Status: DC | PRN

## 2020-08-25 MED ORDER — LORAZEPAM 1 MG PO TABS: 1.0000 mg | ORAL_TABLET | Freq: Four times a day (QID) | ORAL | Status: DC | PRN

## 2020-08-25 MED ORDER — ONDANSETRON HCL 4 MG PO TABS: 4.0000 mg | ORAL_TABLET | Freq: Three times a day (TID) | ORAL | Status: DC | PRN

## 2020-08-25 MED ORDER — POTASSIUM CHLORIDE CRYS ER 20 MEQ PO TBCR
40.0000 meq | EXTENDED_RELEASE_TABLET | Freq: Once | ORAL | Status: AC
  Administered 2020-08-25: 40 meq via ORAL
  Filled 2020-08-25: qty 2
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
## 2020-08-25 NOTE — ED Notes (Signed)
Pt arrived via [facility] after hanging herself with tank top from door in closet.  Pt's husband found her and had to break door down to get to her.  Pt was last seen normal 20 min prior.  Pt was obtunded on arrival but quickly came to with EMS after supplemental O2 given and sternal rub.  Pt has bilateral lacs to upper extremities without evidence of active bleeding.  Patient now oriented but slightly drowsy.  Pt did not arrive with c-collar on.  C-collar placed by bedside nurses.

## 2020-08-25 NOTE — ED Notes (Signed)
Pt to CT with Phineas Semen, TRN

## 2020-08-25 NOTE — ED Notes (Signed)
TRN at bedside [name] and [name] Response times: 1820 Reason for response: L2 hanging Procedures: Labs, CT, charting Consults during care: None at this time.

## 2020-08-25 NOTE — ED Notes (Signed)
Home meds sent to Pharmacy

## 2020-08-26 MED ORDER — QUETIAPINE FUMARATE 50 MG PO TABS
50.0000 mg | ORAL_TABLET | Freq: Every day | ORAL | Status: DC
  Administered 2020-08-26: 50 mg via ORAL
  Filled 2020-08-26 (×3): qty 1

## 2020-08-26 MED ORDER — BACLOFEN 10 MG PO TABS
10.0000 mg | ORAL_TABLET | Freq: Four times a day (QID) | ORAL | Status: DC | PRN
  Filled 2020-08-26: qty 1

## 2020-08-26 MED ORDER — BUPRENORPHINE HCL-NALOXONE HCL 2-0.5 MG SL SUBL
2.0000 | SUBLINGUAL_TABLET | Freq: Three times a day (TID) | SUBLINGUAL | Status: DC
  Administered 2020-08-26 – 2020-08-27 (×2): 2 via SUBLINGUAL
  Filled 2020-08-26 (×2): qty 2

## 2020-08-26 MED ORDER — BUPRENORPHINE HCL-NALOXONE HCL 8-2 MG SL FILM
1.0000 | ORAL_FILM | Freq: Three times a day (TID) | SUBLINGUAL | Status: DC
Start: 2020-08-26 — End: 2020-08-26

## 2020-08-26 MED ORDER — CLONAZEPAM 0.5 MG PO TABS
0.5000 mg | ORAL_TABLET | Freq: Four times a day (QID) | ORAL | Status: DC | PRN
  Administered 2020-08-26: 0.5 mg via ORAL
  Filled 2020-08-26: qty 1

## 2020-08-26 MED ORDER — DIVALPROEX SODIUM 250 MG PO DR TAB
250.0000 mg | DELAYED_RELEASE_TABLET | Freq: Two times a day (BID) | ORAL | Status: DC
  Administered 2020-08-26 – 2020-08-27 (×2): 250 mg via ORAL
  Filled 2020-08-26 (×3): qty 1

## 2020-08-26 MED ORDER — GABAPENTIN 300 MG PO CAPS
300.0000 mg | ORAL_CAPSULE | Freq: Three times a day (TID) | ORAL | Status: DC
  Administered 2020-08-26 – 2020-08-27 (×3): 300 mg via ORAL
  Filled 2020-08-26 (×4): qty 1

## 2020-08-26 NOTE — BH Assessment (Signed)
Disposition: Per Shuvon Rankin, NP pt recommended for inpt treatment. [facility] AC Selena Batten, RN), notified of patient's bed needs.  Under review at [facility].

## 2020-08-26 NOTE — Progress Notes (Signed)
Under review at [facility]  Penni Homans, MSW, LCSW 08/26/2020 10:16 AM

## 2020-08-26 NOTE — ED Notes (Signed)
Pt moved into room for TTS consult

## 2020-08-26 NOTE — BH Assessment (Signed)
Comprehensive Clinical Assessment (CCA) Note  08/26/2020 [name] [id number]  Per Assunta Found, NP, pt is recommended for inpatient treatment. Cortney Mas, RN, and Edwina Barth, RN notified of disposition through secure chat. Patient is under review at [facility].   Flowsheet Row ED from 08/25/2020 in [facility] EMERGENCY DEPARTMENT  C-SSRS RISK CATEGORY High Risk      The patient demonstrates the following risk factors for suicide: Chronic risk factors for suicide include: psychiatric disorder of Bipolar disorder, substance use disorder, previous suicide attempts x2 and previous self-harm cutting a month ago. Acute risk factors for suicide include: family or marital conflict and unemployment. Protective factors for this patient include: responsibility to others (children, family) and hope for the future. Considering these factors, the overall suicide risk at this point appears to be high. Patient is not appropriate for outpatient follow up.   [name] is a 40 year old female presenting to [facility] via [facility] after hanging herself in her closet. Patient states that she ha a lot going on and instead of looking for treatment she got overwhelmed and took things in her own hands. Patient reports stressors related to her children, marriage and recent conflict with her father-in-law. Patient states that something came out about her past related to her sleeping with a black man. Patient reports that her father-in-law became upset about it and was making several racial remarks. Patient stated that she was talking with her mother-in-law and told her that she felt like taking a dirt nap due to issues going on and pt father-in-law insinuated that she should kill herself. Patient reports feeling overwhelmed with emotions and not having anyone to talk with so she went to her closet and impulsively tried to kill herself due to racing and ruminating thoughts about what she was going through. Patient states  that she did not want to die. Patient denies SI at this time and contracts for safety. Patient reports one prior suicide attempt when she was 21+ years old by OD. Patient also reports history of inpatient treatment.   Patient lives at home with her husband, children and mother-in-law. 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 Patient does acknowledge the use of cocaine, THC and meth recently. Patient reports an upcoming appointment to start outpatient services for medication management and therapy. Patient reports diagnosis of Bipolar disorder, depression and anxiety. Patient is tearful during assessment and does not want to go inpatient. Patient feels that her husband IVC'd her because of her negative UDS. Patient reports taking suboxone but her UDS is negative for opiates.     Chief Complaint:  Chief Complaint  Patient presents with  . Suicide Attempt    Pt tried to hang herself with a peace of clothing in her closet   Visit Diagnosis: Bipolar disorder Polysubstance abuse    CCA Screening, Triage and Referral (STR)  Patient Reported Information How did you hear about [place]? No data recorded Referral name: No data recorded Referral phone number: No data recorded  Whom do you see for routine medical problems? No data recorded Practice/Facility Name: No data recorded Practice/Facility Phone Number: No data recorded Name of Contact: No data recorded Contact Number: No data recorded Contact Fax Number: No data recorded Prescriber Name: No data recorded Prescriber Address (if known): No data recorded  What Is the Reason for Your Visit/Call Today? No data recorded How Long Has This Been Causing You Problems? No data recorded What Do  You Feel Would Help You the Most Today? No data recorded  Have You Recently Been in Any Inpatient Treatment (Hospital/Detox/Crisis Center/28-Day Program)? No data recorded Name/Location  of Program/Hospital:No data recorded How Long Were You There? No data recorded When Were You Discharged? No data recorded  Have You Ever Received Services From [facility] Before? No data recorded Who Do You See at [facility]? No data recorded  Have You Recently Had Any Thoughts About Hurting Yourself? No data recorded Are You Planning to Commit Suicide/Harm Yourself At This time? No data recorded  Have you Recently Had Thoughts About Hurting Someone Karolee Ohs? No data recorded Explanation: No data recorded  Have You Used Any Alcohol or Drugs in the Past 24 Hours? No data recorded How Long Ago Did You Use Drugs or Alcohol? No data recorded What Did You Use and How Much? No data recorded  Do You Currently Have a Therapist/Psychiatrist? No data recorded Name of Therapist/Psychiatrist: No data recorded  Have You Been Recently Discharged From Any Office Practice or Programs? No data recorded Explanation of Discharge From Practice/Program: No data recorded    CCA Screening Triage Referral Assessment Type of Contact: No data recorded Is this Initial or Reassessment? No data recorded Date Telepsych consult ordered in [facility]:  No data recorded Time Telepsych consult ordered in [facility]:  No data recorded  Patient Reported Information Reviewed? No data recorded Patient Left Without Being Seen? No data recorded Reason for Not Completing Assessment: No data recorded  Collateral Involvement: No data recorded  Does Patient Have a Court Appointed Legal Guardian? No data recorded Name and Contact of Legal Guardian: No data recorded If Minor and Not Living with Parent(s), Who has Custody? No data recorded Is CPS involved or ever been involved? No data recorded Is APS involved or ever been involved? No data recorded  Patient Determined To Be At Risk for Harm To Self or Others Based on Review of Patient Reported Information or Presenting Complaint? No data recorded Method: No data recorded Availability  of Means: No data recorded Intent: No data recorded Notification Required: No data recorded Additional Information for Danger to Others Potential: No data recorded Additional Comments for Danger to Others Potential: No data recorded Are There Guns or Other Weapons in Your Home? No data recorded Types of Guns/Weapons: No data recorded Are These Weapons Safely Secured?                            No data recorded Who Could Verify You Are Able To Have These Secured: No data recorded Do You Have any Outstanding Charges, Pending Court Dates, Parole/Probation? No data recorded Contacted To Inform of Risk of Harm To Self or Others: No data recorded  Location of Assessment: No data recorded  Does Patient Present under Involuntary Commitment? No data recorded IVC Papers Initial File Date: No data recorded  [place] of Residence: No data recorded  Patient Currently Receiving the Following Services: No data recorded  Determination of Need: No data recorded  Options For Referral: No data recorded    CCA Biopsychosocial Intake/Chief Complaint:  Suicide attempt  Current Symptoms/Problems: SI, diffiuclty sleeping, stress   Patient Reported Schizophrenia/Schizoaffective Diagnosis in Past: No   Strengths: NA  Preferences: NA  Abilities: NA   Type of Services Patient Feels are Needed: outpatient services   Initial Clinical Notes/Concerns: No data recorded  Mental Health Symptoms Depression:  Tearfulness; Change in energy/activity; Irritability   Duration  of Depressive symptoms: Less than two weeks   Mania:  None   Anxiety:   Worrying; Tension   Psychosis:  None   Duration of Psychotic symptoms: No data recorded  Trauma:  None   Obsessions:  None   Compulsions:  None   Inattention:  None   Hyperactivity/Impulsivity:  N/A   Oppositional/Defiant Behaviors:  None   Emotional Irregularity:  None   Other Mood/Personality Symptoms:  No data recorded   Mental Status  Exam Appearance and self-care  Stature:  Average   Weight:  Average weight   Clothing:  Age-appropriate   Grooming:  Normal   Cosmetic use:  None   Posture/gait:  Normal   Motor activity:  Not Remarkable   Sensorium  Attention:  Normal   Concentration:  Normal   Orientation:  Place; Person; Situation   Recall/memory:  Normal   Affect and Mood  Affect:  Anxious; Tearful   Mood:  Anxious; Depressed   Relating  Eye contact:  Normal   Facial expression:  Anxious; Responsive   Attitude toward examiner:  Cooperative   Thought and Language  Speech flow: Clear and Coherent   Thought content:  Appropriate to Mood and Circumstances   Preoccupation:  None   Hallucinations:  None   Organization:  No data recorded  [employer] ServicesExecutive Functions  Fund of Knowledge:  Fair   Intelligence:  Average   Abstraction:  Normal   Judgement:  Poor   Reality Testing:  Adequate   Insight:  Fair   Decision Making:  Impulsive   Social Functioning  Social Maturity:  Responsible   Social Judgement:  Normal   Stress  Stressors:  Family conflict; Relationship   Coping Ability:  Human resources officerverwhelmed   Skill Deficits:  None   Supports:  Family     Religion:    Leisure/Recreation:    Exercise/Diet: Exercise/Diet Do You Have Any Trouble Sleeping?: Yes   CCA Employment/Education Employment/Work Situation: Employment / Work Situation Employment situation: Unemployed What is the longest time patient has a held a job?: NA Where was the patient employed at that time?: NA  Education: Education Is Patient Currently Attending School?: No   CCA Family/Childhood History Family and Relationship History: Family history Marital status: Married What types of issues is patient dealing with in the relationship?: marital issues What is your sexual orientation?: NA Has your sexual activity been affected by drugs, alcohol, medication, or emotional stress?: NA Does patient have  children?: Yes How many children?: 3 How is patient's relationship with their children?: conflict with one of her kids  Childhood History:  Childhood History Additional childhood history information: NA Description of patient's relationship with caregiver when they were a child: NA Patient's description of current relationship with people who raised him/her: NA How were you disciplined when you got in trouble as a child/adolescent?: NA  Child/Adolescent Assessment:     CCA Substance Use Alcohol/Drug Use: Alcohol / Drug Use Pain Medications: See MAR Prescriptions: See MAR Over the Counter: See MAR History of alcohol / drug use?: Yes Negative Consequences of Use: Personal relationships Substance #1 Name of Substance 1: Cocaine 1 - Age of First Use: 28 1 - Last Use / Amount: 3 days ago Substance #2 Name of Substance 2: Meth 2 - Age of First Use: 38 2 - Frequency: unknown Substance #3 Name of Substance 3: THC 3 - Age of First Use: unkown 3 - Last Use / Amount: daily use Substance #4 Name of Substance 4: Heroin  ASAM's:  Six Dimensions of Multidimensional Assessment  Dimension 1:  Acute Intoxication and/or Withdrawal Potential:      Dimension 2:  Biomedical Conditions and Complications:      Dimension 3:  Emotional, Behavioral, or Cognitive Conditions and Complications:     Dimension 4:  Readiness to Change:     Dimension 5:  Relapse, Continued use, or Continued Problem Potential:     Dimension 6:  Recovery/Living Environment:     ASAM Severity Score:    ASAM Recommended Level of Treatment:     Substance use Disorder (SUD)    Recommendations for Services/Supports/Treatments: Recommendations for Services/Supports/Treatments Recommendations For Services/Supports/Treatments: Medication Management  DSM5 Diagnoses: There are no problems to display for this patient.   Per Assunta Found, NP, pt is recommended for inpatient treatment. Cortney  Mas, RN, and Edwina Barth, RN notified of disposition through secure chat. Patient is under review at [facility].   Ambriella Kitt Shirlee More, [facility]

## 2020-08-26 NOTE — BH Assessment (Signed)
TTS attempted assessment however, RN is unavailable at this time. TTS will attempt later today.

## 2020-08-26 NOTE — ED Notes (Signed)
Pt unable to remain awake long enough to complete assessment

## 2020-08-26 NOTE — ED Notes (Signed)
Belongings inventoried and put in locker 5

## 2020-08-26 NOTE — BH Assessment (Signed)
TTS attempted to assess patient, but she was not able to stay awake to participate in the assessment.

## 2020-08-26 NOTE — ED Notes (Signed)
IVC paperwork completed. Faxed to [facility] ?

## 2020-08-26 NOTE — ED Notes (Signed)
Pt on the phone with husband Tinnie Gens. Pt curious as to who IVC her. Pt states she wants to go home with her husband and does not need to be here. Pt extremely agitated.

## 2020-08-27 ENCOUNTER — Encounter (HOSPITAL_COMMUNITY): Payer: Self-pay | Admitting: Family

## 2020-08-27 ENCOUNTER — Other Ambulatory Visit: Payer: Self-pay

## 2020-08-27 ENCOUNTER — Inpatient Hospital Stay (HOSPITAL_COMMUNITY)
Admission: AD | Admit: 2020-08-27 | Discharge: 2020-08-31 | DRG: 885 | Disposition: A | Discharge status: Home / Self Care | Payer: Medicaid Other | Source: Intra-hospital | Attending: Psychiatry | Admitting: Psychiatry

## 2020-08-27 DIAGNOSIS — Y900 Blood alcohol level of less than 20 mg/100 ml: Secondary | ICD-10-CM | POA: Diagnosis present

## 2020-08-27 DIAGNOSIS — Z9151 Personal history of suicidal behavior: Secondary | ICD-10-CM

## 2020-08-27 DIAGNOSIS — K5900 Constipation, unspecified: Secondary | ICD-10-CM | POA: Diagnosis not present

## 2020-08-27 DIAGNOSIS — F332 Major depressive disorder, recurrent severe without psychotic features: Principal | ICD-10-CM | POA: Diagnosis present

## 2020-08-27 DIAGNOSIS — F1910 Other psychoactive substance abuse, uncomplicated: Secondary | ICD-10-CM

## 2020-08-27 DIAGNOSIS — G4700 Insomnia, unspecified: Secondary | ICD-10-CM | POA: Diagnosis present

## 2020-08-27 DIAGNOSIS — Z881 Allergy status to other antibiotic agents status: Secondary | ICD-10-CM

## 2020-08-27 DIAGNOSIS — Z818 Family history of other mental and behavioral disorders: Secondary | ICD-10-CM | POA: Diagnosis not present

## 2020-08-27 DIAGNOSIS — F419 Anxiety disorder, unspecified: Secondary | ICD-10-CM | POA: Diagnosis present

## 2020-08-27 DIAGNOSIS — Z79899 Other long term (current) drug therapy: Secondary | ICD-10-CM | POA: Diagnosis not present

## 2020-08-27 DIAGNOSIS — Z20822 Contact with and (suspected) exposure to covid-19: Secondary | ICD-10-CM | POA: Diagnosis present

## 2020-08-27 DIAGNOSIS — F1120 Opioid dependence, uncomplicated: Secondary | ICD-10-CM | POA: Diagnosis present

## 2020-08-27 DIAGNOSIS — F17210 Nicotine dependence, cigarettes, uncomplicated: Secondary | ICD-10-CM | POA: Diagnosis present

## 2020-08-27 DIAGNOSIS — F1010 Alcohol abuse, uncomplicated: Secondary | ICD-10-CM | POA: Diagnosis present

## 2020-08-27 HISTORY — DX: Other psychoactive substance abuse, uncomplicated: F19.10

## 2020-08-27 MED ORDER — GABAPENTIN 300 MG PO CAPS
300.0000 mg | ORAL_CAPSULE | Freq: Three times a day (TID) | ORAL | Status: DC
  Administered 2020-08-27 – 2020-08-31 (×13): 300 mg via ORAL
  Filled 2020-08-27 (×21): qty 1

## 2020-08-27 MED ORDER — TRAZODONE HCL 50 MG PO TABS
50.0000 mg | ORAL_TABLET | Freq: Every evening | ORAL | Status: DC | PRN
  Administered 2020-08-27: 50 mg via ORAL
  Filled 2020-08-27: qty 1

## 2020-08-27 MED ORDER — BUPRENORPHINE HCL-NALOXONE HCL 2-0.5 MG SL SUBL
2.0000 | SUBLINGUAL_TABLET | Freq: Three times a day (TID) | SUBLINGUAL | Status: DC
Start: 2020-08-27 — End: 2020-08-31
  Administered 2020-08-27 – 2020-08-31 (×13): 2 via SUBLINGUAL
  Filled 2020-08-27 (×14): qty 2

## 2020-08-27 MED ORDER — QUETIAPINE FUMARATE 50 MG PO TABS
50.0000 mg | ORAL_TABLET | Freq: Every day | ORAL | Status: DC
  Administered 2020-08-27 – 2020-08-30 (×4): 50 mg via ORAL
  Filled 2020-08-27 (×7): qty 1

## 2020-08-27 MED ORDER — DIVALPROEX SODIUM 250 MG PO DR TAB
250.0000 mg | DELAYED_RELEASE_TABLET | Freq: Two times a day (BID) | ORAL | Status: DC
  Administered 2020-08-27 – 2020-08-31 (×8): 250 mg via ORAL
  Filled 2020-08-27 (×13): qty 1

## 2020-08-27 MED ORDER — BACLOFEN 10 MG PO TABS: 10.0000 mg | ORAL_TABLET | Freq: Four times a day (QID) | ORAL | Status: DC | PRN

## 2020-08-27 MED ORDER — ALUM & MAG HYDROXIDE-SIMETH 200-200-20 MG/5ML PO SUSP: 30.0000 mL | ORAL | Status: DC | PRN

## 2020-08-27 MED ORDER — MAGNESIUM HYDROXIDE 400 MG/5ML PO SUSP
30.0000 mL | Freq: Every day | ORAL | Status: DC | PRN
  Administered 2020-08-29: 30 mL via ORAL
  Filled 2020-08-27: qty 30

## 2020-08-27 MED ORDER — ACETAMINOPHEN 325 MG PO TABS: 650.0000 mg | ORAL_TABLET | Freq: Four times a day (QID) | ORAL | Status: DC | PRN

## 2020-08-27 MED ORDER — CLONAZEPAM 0.5 MG PO TABS
0.5000 mg | ORAL_TABLET | Freq: Two times a day (BID) | ORAL | Status: DC | PRN
  Administered 2020-08-28 – 2020-08-31 (×6): 0.5 mg via ORAL
  Filled 2020-08-27 (×6): qty 1

## 2020-08-27 MED ORDER — CLONAZEPAM 0.5 MG PO TABS
0.5000 mg | ORAL_TABLET | Freq: Four times a day (QID) | ORAL | Status: DC | PRN
  Administered 2020-08-27: 0.5 mg via ORAL
  Filled 2020-08-27: qty 1

## 2020-08-27 NOTE — BHH Suicide Risk Assessment (Signed)
[facility] Admission Suicide Risk Assessment   Nursing information obtained from:  Patient Demographic factors:  Adolescent or young adult,Unemployed,Caucasian,Low socioeconomic status Current Mental Status:  Self-harm thoughts Loss Factors:  Financial problems / change in socioeconomic status,Decrease in vocational status Historical Factors:  Impulsivity Risk Reduction Factors:  Responsible for children under 66 years of age,Sense of responsibility to family  Total Time spent with patient: 30 minutes   Principal Problem: MDD (major depressive disorder), recurrent episode, severe ([place]) Diagnosis:  Principal Problem:   MDD (major depressive disorder), recurrent episode, severe ([place]) Active Problems:   Polysubstance abuse ([place])  Subjective Data: Medical record reviewed.  I met with and evaluated the patient today on the unit. [name] is a 40 year old female with a self-reported history of bipolar disorder and polysubstance use disorder who was admitted after initial presentation [facility] via [facility] after she attempted to hang herself in her closet.  Patient reports that for at least 1 to 2 weeks prior to admission she started having some trouble comprehending what was being said during conversations.  She states that she may have been mixing up her medications.  In the 1 to 2 weeks prior to admission the patient also experienced symptoms of anhedonia, hypersomnia, trouble getting out of bed and getting dressed, low motivation, feeling numb, decreased appetite, problems with concentration, low self-esteem, low confidence in the context of stressors related to her children, marriage and recent conflict with her father-in-law.  Patient states her father-in-law made some insulting remarks about a relationship that she had prior to her relationship with her husband and that father-in-law also insinuated that the patient should kill herself.  Patient got upset and went into the closet  where she sat with her anger and other emotions.  She reports that she started to tie tank tops together but was not suicidal and subsequently she tried to hang herself with a tank top around her neck suspended from the door. When it slid off the door she also put an electrical cord around her neck in an attempt to hang herself.  Patient's husband had to cut patient loose and the family called EMS.  Upon arrival of EMS, the patient was poorly responsive only to deep painful stimulus and had agonal respirations.  She was given supplemental oxygen and started to improve during transport to the ED.    On interview this afternoon the patient is vague about her recent substance use history but states "I know I have cocaine in my system."  She reports that she has been 6 months clean from heroin and has been taking Suboxone 8 mg 3 times daily prescribed by [facility].  She denies alcohol use and states she last drank alcohol 1 year ago.  She does report prior problems with alcohol.  Patient reports marijuana use to sleep or for appetite several times per month.  Urine drug screen in the ED was positive for amphetamines, benzodiazepines, cocaine and THC.  The patient reports that she has been diagnosed with bipolar disorder in the past and states that she is prescribed Klonopin 0.5 mg 4 times daily, Seroquel 50 mg twice daily, gabapentin (unknown dose), risperidone (unknown dose) and clonidine which she is supposed to take as needed when she feels her blood pressure is elevated.  She states that she is prescribed other medications but is uncertain as to what they are.  She denies access to firearms.    Patient reports that she has a history  of 1 prior inpatient psychiatric admission at age 76 after she took an overdose on pills in a suicide attempt.  She states that this overdose at age 83 was her only prior suicide attempt until the attempted hanging precipitating the current admission.  The patient  reports a history of nonsuicidal self-injurious behavior by cutting with onset at age 31.  Patient states she cuts to release pain.  She last cut 2 days prior to her recent suicide attempt.  She denies AH, VH, PI, AI or HI.  The patient expresses regret about her attempt and is concerned that her children will be worried and that her 51 year old will not want to talk to her again.  She denies depressed mood currently and denies suicidal ideation or passive wishes for death.  The patient denies any current withdrawal symptoms and denies any physical problems.  She does not have a tremor.  There is no rhinorrhea, lacrimation, piloerection, diaphoresis observed on exam.  Most recent vital signs at [facility] include BP of 108/71, pulse of 81, temperature of 98.2, respirations of 16 and O2 sat of 100%.  The patient reports family history of completed suicide in a paternal great uncle.  She states that her mother has problems with depression and anxiety and took Xanax and her father has an unknown mental health problem and also took Xanax.  The patient states that there is a family history of a paternal relative with schizophrenia.  Patient states that she has a brother with alcohol problems.  Continued Clinical Symptoms:  Alcohol Use Disorder Identification Test Final Score (AUDIT): 0 The "Alcohol Use Disorders Identification Test", Guidelines for Use in Primary Care, Second Edition.  World Pharmacologist [facility]). Score between 0-7:  no or low risk or alcohol related problems. Score between 8-15:  moderate risk of alcohol related problems. Score between 16-19:  high risk of alcohol related problems. Score 20 or above:  warrants further diagnostic evaluation for alcohol dependence and treatment.   CLINICAL FACTORS:   Depression:   Anhedonia Impulsivity Alcohol/Substance Abuse/Dependencies More than one psychiatric diagnosis Previous Psychiatric Diagnoses and Treatments   Musculoskeletal: Strength &  Muscle Tone: within normal limits Gait & Station: normal Patient leans: N/A  Psychiatric Specialty Exam:  Presentation  General Appearance: Disheveled  Eye Contact:Good  Speech:Clear and Coherent; Other (comment) (Verbose but not pressured)  Speech Volume:Normal  Handedness:No data recorded  Mood and Affect  Mood:Dysphoric; Depressed; Anxious  Affect:Congruent; Tearful; Depressed   Thought Process  Thought Processes:Coherent  Descriptions of Associations:Tangential  Orientation:Full (Time, Place and Person)  Thought Content:Logical  History of Schizophrenia/Schizoaffective disorder:No  Duration of Psychotic Symptoms:No data recorded Hallucinations:Hallucinations: None  Ideas of Reference:None  Suicidal Thoughts:Suicidal Thoughts: No (The patient had suicidal ideation prior to admission and attempted suicide by hanging.  She denies suicidal ideation or wish for death now.)  Homicidal Thoughts:Homicidal Thoughts: No   Sensorium  Memory:Immediate Fair; Recent Fair; Remote Fair  Judgment:Impaired  Insight:Shallow   Executive Functions  Concentration:Fair  Attention Span:Fair  Twining  Language:Good   Psychomotor Activity  Psychomotor Activity:Psychomotor Activity: Decreased   Assets  Assets:Communication Skills; Desire for Improvement; Housing; Resilience; Social Support   Sleep  Sleep:Sleep: Good (Hypersomnia)    Physical Exam: Physical Exam Vitals and nursing note reviewed.  HENT:     Head: Normocephalic and atraumatic.  Neurological:     General: No focal deficit present.     Mental Status: She is alert and oriented to person, place, and  time.    Review of Systems  Constitutional: Negative for chills, diaphoresis and fever.  HENT: Negative.   Eyes: Negative for blurred vision.  Respiratory: Negative for cough and shortness of breath.   Cardiovascular: Negative for chest pain and palpitations.   Gastrointestinal: Negative for constipation, diarrhea, nausea and vomiting.  Genitourinary: Negative for dysuria.  Musculoskeletal: Negative.   Skin: Negative for rash.  Neurological: Negative for dizziness, tremors, seizures and headaches.  Psychiatric/Behavioral: Positive for depression, substance abuse and suicidal ideas. Negative for hallucinations. The patient is nervous/anxious.    Blood pressure 108/71, pulse 81, temperature 98.2 F (36.8 C), temperature source Oral, resp. rate 16, height '5\' 7"'  (1.702 m), weight 54 kg, SpO2 100 %. Body mass index is 18.64 kg/m.   COGNITIVE FEATURES THAT CONTRIBUTE TO RISK:  Polarized thinking    SUICIDE RISK:   Moderate:  Frequent suicidal ideation with limited intensity, and duration, some specificity in terms of plans, no associated intent, good self-control, limited dysphoria/symptomatology, some risk factors present, and identifiable protective factors, including available and accessible social support.  PLAN OF CARE: Patient's recent symptoms of anhedonia, hypersomnia, decreased concentration, decreased appetite, feeling numb, poor motivation, low self-esteem, feelings of guilt and worthlessness, SI and recent suicide attempt are consistent with major depressive episode however strongly suspect that substance use or incorrect use of prescribed medications may have contributed to her symptoms as well.  We will place on every 15-minute observation status.  Encourage patient to participate in group therapy and therapeutic milieu.  Results of labs from 08/25/2020 reviewed include CMP with potassium of 2.8, calcium of 8.6, alkaline phosphatase of 35 and otherwise WNL.  CBC with hemoglobin of 11.1 and hematocrit of 34.4 and otherwise WNL.  Differential was WNL.  Prothrombin time and INR were WNL.  Quantitative hCG was <5.0.  Urine drug screen was positive for amphetamines, benzodiazepines, cocaine and THC but not opiates.  EKG performed on 08/25/2020  revealed sinus rhythm with rate of 81 and QT/QTc of 423/491.  Head CT without contrast performed on 08/25/2020 revealed no acute intracranial process.  Chest x-ray performed on 08/25/2020 revealed no active disease.  CT of the cervical spine performed on 08/25/2020 revealed no acute cervical spine fracture and mild spondylosis at C5-6.  See MAR for medications.  Have ordered repeat CMP, hemoglobin A1c, lipid panel and TSH with a.m. draw on 08/28/2020.  Estimated length of stay 5 to 7 days.  I certify that inpatient services furnished can reasonably be expected to improve the patient's condition.   Arthor Captain, MD 08/27/2020, 5:06 PM

## 2020-08-27 NOTE — ED Notes (Signed)
Breakfast Ordered 

## 2020-08-27 NOTE — ED Notes (Signed)
Pt moved to room 41. Ambulated to bathroom for BM. Returned to room. Requested applesauce and juice. RN able to visualize pt. Pt does not have 1:1 sitter at this time.

## 2020-08-27 NOTE — ED Notes (Signed)
Report given to Lissa Hoard, RN of [facility] [id number]

## 2020-08-27 NOTE — Progress Notes (Signed)
Under review at [facility].   Penni Homans, MSW, LCSW 08/27/2020 9:45 AM

## 2020-08-27 NOTE — ED Provider Notes (Signed)
Patient seen and evaluated in ED for self-inflicted harm.  She was medically cleared and has been evaluated by psychiatry and is excepted to their service.   Margarita Grizzle, MD 08/27/20 1106
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
## 2020-08-27 NOTE — Progress Notes (Signed)
Psychoeducational Group Note  Date:  08/27/2020 Time: 2015 Group Topic/Focus:  wrap up group  Participation Level: Did Not Attend  Participation Quality:  Not Applicable  Affect:  Not Applicable  Cognitive:  Not Applicable  Insight:  Not Applicable  Engagement in Group: Not Applicable  Additional Comments:  Did not attend.   Johann Capers S 08/27/2020, 9:09 PM

## 2020-08-27 NOTE — ED Notes (Signed)
Pts belongings given to transport along with home medications.

## 2020-08-27 NOTE — Tx Team (Signed)
Initial Treatment Plan 08/27/2020 4:18 PM [name] DTO:[id number]    PATIENT STRESSORS: Financial difficulties Marital or family conflict Occupational concerns Substance abuse   PATIENT STRENGTHS: Capable of independent living Barrister's clerk for treatment/growth Special hobby/interest Supportive family/friends   PATIENT IDENTIFIED PROBLEMS: Alterations in mood (Anxiety and depression) "I am depressed and anxious, I'm not working and I had issues with my father in law".    Risk for self harm "I hunged myself with my tank top in a suicide attempt"    Substance Abuse "I use cocaine, weed and meth".             DISCHARGE CRITERIA:  Improved stabilization in mood, thinking, and/or behavior Verbal commitment to aftercare and medication compliance  PRELIMINARY DISCHARGE PLAN: Outpatient therapy Participate in family therapy Return to previous living arrangement  PATIENT/FAMILY INVOLVEMENT: This treatment plan has been presented to and reviewed with the patient, [name]. The patient have been given the opportunity to ask questions and make suggestions.  Sherryl Manges, RN 08/27/2020, 4:18 PM

## 2020-08-27 NOTE — Progress Notes (Signed)
Per PDMP Review  Suboxone 16m-2mg  SI Film was last prescribed my Mikael Spray MD on 07/31/2020

## 2020-08-28 LAB — COMPREHENSIVE METABOLIC PANEL
ALT: 12 U/L (ref 0–44)
AST: 17 U/L (ref 15–41)
Albumin: 3 g/dL — ABNORMAL LOW (ref 3.5–5.0)
Alkaline Phosphatase: 32 U/L — ABNORMAL LOW (ref 38–126)
Anion gap: 9 (ref 5–15)
BUN: 16 mg/dL (ref 6–20)
CO2: 25 mmol/L (ref 22–32)
Calcium: 8.5 mg/dL — ABNORMAL LOW (ref 8.9–10.3)
Chloride: 108 mmol/L (ref 98–111)
Creatinine, Ser: 0.6 mg/dL (ref 0.44–1.00)
GFR, Estimated: >60 mL/min (ref >60)
Glucose, Bld: 116 mg/dL — ABNORMAL HIGH (ref 70–99)
Potassium: 3.5 mmol/L (ref 3.5–5.1)
Sodium: 142 mmol/L (ref 135–145)
Total Bilirubin: 0.2 mg/dL — ABNORMAL LOW (ref 0.3–1.2)
Total Protein: 6 g/dL — ABNORMAL LOW (ref 6.5–8.1)

## 2020-08-28 LAB — HEMOGLOBIN A1C
Hgb A1c MFr Bld: 5.2 % (ref 4.8–5.6)
Mean Plasma Glucose: 102.54 mg/dL

## 2020-08-28 LAB — LIPID PANEL
Cholesterol: 122 mg/dL (ref 0–200)
HDL: 31 mg/dL — ABNORMAL LOW (ref >40)
LDL Cholesterol: 66 mg/dL (ref 0–99)
Total CHOL/HDL Ratio: 3.9 RATIO
Triglycerides: 124 mg/dL (ref <150)
VLDL: 25 mg/dL (ref 0–40)

## 2020-08-28 LAB — TSH: TSH: 1.046 u[IU]/mL (ref 0.350–4.500)

## 2020-08-28 MED ORDER — ENSURE ENLIVE PO LIQD
237.0000 mL | Freq: Two times a day (BID) | ORAL | Status: DC
  Administered 2020-08-28 – 2020-08-31 (×7): 237 mL via ORAL
  Filled 2020-08-28 (×11): qty 237

## 2020-08-28 NOTE — Progress Notes (Signed)
Pt observed to be guarded earlier this shift, isolative to room or seen on phone talking with husband at intervals or in bed. Became more sociable with peers as shift progressed. Observed in 300 hall dayroom for scheduled groups. Affect is flat with logical speech and her mood is depressed. States "I'm just still concerned about not being with my family". Rates her depression and anxiety both 5/10. Reports she slept well last night with fair appetite. Fluids and mobility encouraged and tolerated well due to low BP readings. Pt is compliant with medications, tolerates all PO intake well. She denies SI, HI, AVH and pain at this time.   Emotional support and encouragement provided to pt. Safety checks maintained without self harm gestures. All medications given as ordered and effects monitored. Vitals rechecked and WNL. Pt denies discomfort or adverse drug reactions at this time. Remains safe on unit without incident.
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
## 2020-08-28 NOTE — BHH Group Notes (Signed)
Type of Group and Topic: Psychoeducational Group: Discharge Planning  Participation Level: Active  Description of Group  Discharge planning group reviews patient's anticipated discharge plans and assists patients to anticipate and address any barriers to wellness/recovery in the community. Suicide prevention education is reviewed with patients in group.  Therapeutic Goals  1. Patients will state their anticipated discharge plan and mental health aftercare  2. Patients will identify potential barriers to wellness in the community setting  3. Patients will engage in problem solving, solution focused discussion of ways to anticipate and address barriers to wellness/recovery  Summary of Patient Progress: Oval states that after discharge she wants to work on focusing on herself more and less on other people's negative comments.  Lannette Donath participated in group and accepted the worksheet that was provided.

## 2020-08-28 NOTE — Progress Notes (Signed)
Patient did attend the evening speaker AA meeting.  

## 2020-08-28 NOTE — Progress Notes (Signed)
Recreation Therapy Notes  Date:  5.6.22 Time: 0930 Location: 300 Hall Dayroom  Group Topic: Stress Management  Goal Area(s) Addresses:  Patient will identify positive stress management techniques. Patient will identify benefits of using stress management post d/c.  Intervention: Stress Management  Activity:  Meditation.  LRT played a meditation that focused on deep concentration.  Patients were to listen to the meditation and follow along as it played to focus on breathing and concentrate as much as possible.    Education:  Stress Management, Discharge Planning.   Education Outcome: Acknowledges Education  Clinical Observations/Feedback:  Pt did not attend group.    Caroll Rancher, LRT/CTRS    Lillia Abed, Kristilyn Coltrane A 08/28/2020 11:04 AM

## 2020-08-28 NOTE — H&P (Signed)
Psychiatric Admission Assessment Adult  Patient Identification: [name] MRN:  [id number] Date of Evaluation:  08/28/2020 Chief Complaint:  MDD (major depressive disorder), recurrent episode, severe ([place]) [F33.2] Principal Diagnosis: MDD (major depressive disorder), recurrent episode, severe ([place]) Diagnosis:  Principal Problem:   MDD (major depressive disorder), recurrent episode, severe ([place]) Active Problems:   Polysubstance abuse ([place])  History of Present Illness: Medical record reviewed.    Patient's case discussed in detail with members of the treatment team.  I met with and evaluated the patient today in the office on the unit.   [name] is a 40 year old female with a self-reported history of bipolar disorder and polysubstance use disorder who was admitted after initial presentation to [facility] via [facility] after she attempted to hang herself in her closet.  Patient reports that for at least 1 to 2 weeks prior to admission she started having some trouble comprehending what was being said during conversations.  She states that she may have been mixing up her medications.  In the 1 to 2 weeks prior to admission the patient also experienced symptoms of anhedonia, hypersomnia, trouble getting out of bed and getting dressed, low motivation, feeling numb, decreased appetite, problems with concentration, low self-esteem, low confidence in the context of stressors related to her children, marriage and recent conflict with her father-in-law.  Patient states her father-in-law made some insulting remarks about a relationship that she had prior to her relationship with her husband and that father-in-law also insinuated that the patient should kill herself.  Patient got upset and went into the closet where she sat with her anger and other emotions.  She reports that she started to tie tank tops together but was not suicidal and subsequently she tried to hang herself with a tank top  around her neck suspended from the door. When it slid off the door she also put an electrical cord around her neck in an attempt to hang herself.  Patient's husband had to cut patient loose and the family called EMS.  Upon arrival of EMS, the patient was poorly responsive only to deep painful stimulus and had agonal respirations.  She was given supplemental oxygen and started to improve during transport to the ED.    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  She denies alcohol use and states she last drank alcohol 1 year ago.  She does report prior problems with alcohol.  Patient reports marijuana use to sleep or for appetite several times per month.  Urine drug screen in the ED was positive for amphetamines, benzodiazepines, cocaine and THC.  The patient reports that she has been diagnosed with bipolar disorder in the past and states that she is prescribed Klonopin 0.5 mg 4 times daily, Seroquel 50 mg twice daily, gabapentin (unknown dose), risperidone (unknown dose) and clonidine which she is supposed to take as needed when she feels her blood pressure is elevated.  She states that she is prescribed other medications but is uncertain as to what they are.  She denies access to firearms.    Patient reports that she has a history of 1 prior inpatient psychiatric admission at age 64 after she took an overdose on pills in a suicide attempt.  She states that this overdose at age 70 was her only prior suicide attempt until the attempted hanging precipitating the current admission.  The patient reports a history of nonsuicidal self-injurious behavior by cutting with onset at age 25.  Patient states she cuts to release pain.  She last cut 2 days prior to her recent suicide  attempt.    On interview today, the patient states that her mood is "a lot better."  She denies depressed mood.  She reports she is anxious to go home because she wants to spend time with her family and her son who is coming home from college.  She denies wishes for death and denies suicidal ideation.  The patient reports that she slept well.  She expresses remorse over her suicide attempt and states she feels she let her family down and worries that she will of upset her children.  She denies AI, HI, AH, VH or PI.  Patient denies any symptoms of withdrawal from opioids or benzodiazepines.  She denies any physical problems.  She denies any medication side effects.  On exam the patient does not display any tremor, diaphoresis, rhinorrhea, lacrimation or piloerection.    PDMP review reveals that on 08/17/2020 patient filled a prescription for clonazepam 0.5 mg tablets quantity 60 for 15 days and that on 07/31/2020 she filled a Suboxone prescription 8 mg - 2 mg quantity 90 for 30 days.   Associated Signs/Symptoms: Depression Symptoms:  depressed mood, anhedonia, hypersomnia, fatigue, feelings of worthlessness/guilt, impaired memory, suicidal attempt, anxiety, Duration of Depression Symptoms: Less than two weeks  (Hypo) Manic Symptoms:  Impulsivity, Mood lability Anxiety Symptoms:  Excessive Worry, Psychotic Symptoms:  Denies PTSD Symptoms: Negative Total Time spent with patient: 30 minutes  Past Psychiatric History: Patient reports past diagnosis of bipolar disorder, depression, anxiety.  She reports a history of 1 prior inpatient psychiatric admission at age 66 after she took an overdose on pills in a suicide attempt.  She states that this overdose at age 60 was her only prior suicide attempt until the attempted hanging precipitating the current admission.  The patient reports a history of nonsuicidal self-injurious behavior by cutting with onset at age 51.  Patient states she cuts to release  pain.    Is the patient at risk to self? Yes.    Has the patient been a risk to self in the past 6 months? Yes.    Has the patient been a risk to self within the distant past? Yes.    Is the patient a risk to others? No.  Has the patient been a risk to others in the past 6 months? No.  Has the patient been a risk to others within the distant past? No.   Prior Inpatient Therapy:   Prior Outpatient Therapy:    Alcohol Screening: Patient refused Alcohol Screening Tool: Yes 1. How often do you have a drink containing alcohol?: Never 2. How many drinks containing alcohol do you have on a typical day when you are drinking?: 1 or 2 3. How often do you have six or more drinks on one occasion?: Never AUDIT-C Score: 0 4. How often during the last year have you found that you were not able to stop drinking once you had started?: Never 5. How often during the last year have you failed to do what was normally expected from you because of drinking?: Never 6. How often during the last year have you needed a first drink in the morning to get  yourself going after a heavy drinking session?: Never 7. How often during the last year have you had a feeling of guilt of remorse after drinking?: Never 8. How often during the last year have you been unable to remember what happened the night before because you had been drinking?: Never 9. Have you or someone else been injured as a result of your drinking?: No 10. Has a relative or friend or a doctor or another health worker been concerned about your drinking or suggested you cut down?: No Alcohol Use Disorder Identification Test Final Score (AUDIT): 0 Substance Abuse History in the last 12 months:  Yes.   Consequences of Substance Abuse: Patient has used heroin until 6 months ago and is now on Suboxone.  Her urine drug screen was positive for amphetamines, cocaine and THC on admission.  Substance use likely exacerbated mood and anxiety symptoms and may have  contributed to patient's confusion prior to admission as well as to her suicide attempt. Previous Psychotropic Medications: Yes  Psychological Evaluations: Yes  Past Medical History:  Past Medical History:  Diagnosis Date  . Polysubstance abuse ([place]) 08/27/2020   History reviewed. No pertinent surgical history. Family History: History reviewed. No pertinent family history.  Family Psychiatric  History: The patient reports family history of completed suicide in a paternal great uncle.  She states that her mother has problems with depression and anxiety and took Xanax and her father has an unknown mental health problem and also took Xanax.  The patient states that there is a family history of a paternal relative with schizophrenia.  Patient states that she has a brother with alcohol problems.  Tobacco Screening:   Social History:  Social History   Substance and Sexual Activity  Alcohol Use None     Social History   Substance and Sexual Activity  Drug Use Not on file    Additional Social History: Marital status: Married Number of Years Married: 7 What types of issues is patient dealing with in the relationship?: None Are you sexually active?: Yes What is your sexual orientation?: Heterosexual Has your sexual activity been affected by drugs, alcohol, medication, or emotional stress?: No Does patient have children?: Yes How many children?: 3 How is patient's relationship with their children?: "I have one in college, an 60 year old with their father's mother, and a 44 year old that is with his father"                         Allergies:   Allergies  Allergen Reactions  . Vancomycin Rash    ALL MYCINS   Lab Results:  Results for orders placed or performed during the hospital encounter of 08/27/20 (from the past 48 hour(s))  Comprehensive metabolic panel     Status: Abnormal   Collection Time: 08/28/20  9:28 AM  Result Value Ref Range   Sodium 142 135 - 145 mmol/L    Potassium 3.5 3.5 - 5.1 mmol/L   Chloride 108 98 - 111 mmol/L   CO2 25 22 - 32 mmol/L   Glucose, Bld 116 (H) 70 - 99 mg/dL    Comment: Glucose reference range applies only to samples taken after fasting for at least 8 hours.   BUN 16 6 - 20 mg/dL   Creatinine, Ser 0.60 0.44 - 1.00 mg/dL   Calcium 8.5 (L) 8.9 - 10.3 mg/dL   Total Protein 6.0 (L) 6.5 - 8.1 g/dL   Albumin 3.0 (L) 3.5 -  5.0 g/dL   AST 17 15 - 41 U/L   ALT 12 0 - 44 U/L   Alkaline Phosphatase 32 (L) 38 - 126 U/L   Total Bilirubin 0.2 (L) 0.3 - 1.2 mg/dL   GFR, Estimated >60 >60 mL/min    Comment: (NOTE) Calculated using the CKD-EPI Creatinine Equation (2021)    Anion gap 9 5 - 15    Comment: Performed at [facility], [place] [address], [place], [place] [address]  Lipid panel     Status: Abnormal   Collection Time: 08/28/20  9:28 AM  Result Value Ref Range   Cholesterol 122 0 - 200 mg/dL   Triglycerides 124 <150 mg/dL   HDL 31 (L) >40 mg/dL   Total CHOL/HDL Ratio 3.9 RATIO   VLDL 25 0 - 40 mg/dL   LDL Cholesterol 66 0 - 99 mg/dL    Comment:        Total Cholesterol/HDL:CHD Risk Coronary Heart Disease Risk Table                     Men   Women  1/2 Average Risk   3.4   3.3  Average Risk       5.0   4.4  2 X Average Risk   9.6   7.1  3 X Average Risk  23.4   11.0        Use the calculated Patient Ratio above and the CHD Risk Table to determine the patient's CHD Risk.        ATP III CLASSIFICATION (LDL):  <100     mg/dL   Optimal  100-129  mg/dL   Near or Above                    Optimal  130-159  mg/dL   Borderline  160-189  mg/dL   High  >190     mg/dL   Very High Performed at [facility] [address], [place], [place] [address]   TSH     Status: None   Collection Time: 08/28/20  9:28 AM  Result Value Ref Range   TSH 1.046 0.350 - 4.500 uIU/mL    Comment: Performed by a 3rd Generation assay with a functional sensitivity of <=0.01 uIU/mL. Performed at [facility], [place] [address], [place], [place] [address]     Blood Alcohol level:  Lab Results  Component Value Date   ETH <10 76/73/4193    Metabolic Disorder Labs:  No results found for: HGBA1C, MPG No results found for: PROLACTIN Lab Results  Component Value Date   CHOL 122 08/28/2020   TRIG 124 08/28/2020   HDL 31 (L) 08/28/2020   CHOLHDL 3.9 08/28/2020   VLDL 25 08/28/2020   LDLCALC 66 08/28/2020    Current Medications: Current Facility-Administered Medications  Medication Dose Route Frequency Provider Last Rate Last Admin  . acetaminophen (TYLENOL) tablet 650 mg  650 mg Oral Q6H PRN Sharma Covert, MD      . alum & mag hydroxide-simeth (MAALOX/MYLANTA) 200-200-20 MG/5ML suspension 30 mL  30 mL Oral Q4H PRN Sharma Covert, MD      . baclofen (LIORESAL) tablet 10 mg  10 mg Oral QID PRN Sharma Covert, MD      . buprenorphine-naloxone (SUBOXONE) 2-0.5 mg per SL tablet 2 tablet  2 tablet Sublingual TID Sharma Covert, MD   2 tablet at 08/28/20 1258  . clonazePAM (  KLONOPIN) tablet 0.5 mg  0.5 mg Oral BID PRN Arthor Captain, MD      . divalproex (DEPAKOTE) DR tablet 250 mg  250 mg Oral BID Sharma Covert, MD   250 mg at 08/28/20 0755  . feeding supplement (ENSURE ENLIVE / ENSURE PLUS) liquid 237 mL  237 mL Oral BID BM Sharma Covert, MD   237 mL at 08/28/20 1023  . gabapentin (NEURONTIN) capsule 300 mg  300 mg Oral TID Sharma Covert, MD   300 mg at 08/28/20 1256  . magnesium hydroxide (MILK OF MAGNESIA) suspension 30 mL  30 mL Oral Daily PRN Sharma Covert, MD      . QUEtiapine (SEROQUEL) tablet 50 mg  50 mg Oral QHS Sharma Covert, MD   50 mg at 08/27/20 2122   PTA Medications: Medications Prior to Admission  Medication Sig Dispense Refill Last Dose  . baclofen (LIORESAL) 10 MG tablet Take 10 mg by mouth 4 (four) times daily as needed for muscle spasms.     . clonazePAM (KLONOPIN) 0.5 MG tablet Take 0.5 mg by mouth 4 (four) times  daily as needed for anxiety.     . divalproex (DEPAKOTE) 250 MG DR tablet Take 250 mg by mouth 2 (two) times daily.     Marland Kitchen gabapentin (NEURONTIN) 300 MG capsule Take 300 mg by mouth 3 (three) times daily.     . QUEtiapine (SEROQUEL) 50 MG tablet Take 50 mg by mouth at bedtime.     . SUBOXONE 8-2 MG FILM Place 1 Film under the tongue 3 (three) times daily.       Musculoskeletal: Strength & Muscle Tone: within normal limits Gait & Station: normal Patient leans: N/A            Psychiatric Specialty Exam:  Presentation  General Appearance: Disheveled  Eye Contact:Good  Speech:Clear and Coherent; Other (comment) (Verbose but not pressured)  Speech Volume:Normal  Handedness:No data recorded  Mood and Affect  Mood:Dysphoric; Depressed; Anxious  Affect:Congruent; Tearful; Depressed   Thought Process  Thought Processes:Coherent  Duration of Psychotic Symptoms: No data recorded Past Diagnosis of Schizophrenia or Psychoactive disorder: No  Descriptions of Associations:Tangential  Orientation:Full (Time, Place and Person)  Thought Content:Logical  Hallucinations:Hallucinations: None  Ideas of Reference:None  Suicidal Thoughts:Suicidal Thoughts: No (The patient had suicidal ideation prior to admission and attempted suicide by hanging.  She denies suicidal ideation or wish for death now.)  Homicidal Thoughts:Homicidal Thoughts: No   Sensorium  Memory:Immediate Fair; Recent Fair; Remote Fair  Judgment:Impaired  Insight:Shallow   Executive Functions  Concentration:Fair  Attention Span:Fair  [place]  Language:Good   Psychomotor Activity  Psychomotor Activity:Psychomotor Activity: Decreased   Assets  Assets:Communication Skills; Desire for Improvement; Housing; Resilience; Social Support   Sleep  Sleep:Sleep: Good (Hypersomnia)    Physical Exam: Physical Exam Vitals and nursing note reviewed.  HENT:     Head:  Normocephalic and atraumatic.  Neurological:     General: No focal deficit present.     Mental Status: She is alert and oriented to person, place, and time.    Review of Systems  Constitutional: Negative for chills, diaphoresis and fever.  HENT: Negative for sore throat.   Eyes: Negative for blurred vision.  Respiratory: Negative for cough and shortness of breath.   Cardiovascular: Negative for chest pain and palpitations.  Gastrointestinal: Negative for abdominal pain, constipation, diarrhea, nausea and vomiting.  Genitourinary: Negative for dysuria.  Musculoskeletal: Negative.  Skin: Negative for rash.  Neurological: Negative for dizziness, tremors, seizures and headaches.  Psychiatric/Behavioral: Positive for substance abuse. Negative for hallucinations and suicidal ideas. The patient is nervous/anxious.    Blood pressure 94/60, pulse 82, temperature 98.8 F (37.1 C), temperature source Oral, resp. rate 18, height _0  (1.702 m), weight 54 kg, SpO2 98 %. Body mass index is 18.64 kg/m.  Treatment Plan Summary: Patient is a 40 year old female with a history of depression versus bipolar disorder and polysubstance use disorder admitted following a suicide attempt by hanging.  Patient's recent symptoms of anhedonia, hypersomnia, decreased concentration, decreased appetite, feeling numb, poor motivation, low self-esteem, feelings of guilt and worthlessness, SI and recent suicide attempt are consistent with major depressive episode however strongly suspect that substance use as well as possible incorrect use of prescribed medications may have contributed to her symptoms as well.    Daily contact with patient to assess and evaluate symptoms and progress in treatment and Medication management   Continue IVC status  Observation Level/Precautions:  15 minute checks  Laboratory:  CBC Chemistry Profile HbAIC HCG UDS TSH, lipid panel.  Available lab results reviewed.  Results of labs from  08/25/2020 reviewed include CMP with potassium of 2.8, calcium of 8.6, alkaline phosphatase of 35 and otherwise WNL.  CBC with hemoglobin of 11.1 and hematocrit of 34.4 and otherwise WNL.  Differential was WNL.  Prothrombin time and INR were WNL.  Quantitative hCG was <5.0.  Urine drug screen was positive for amphetamines, benzodiazepines, cocaine and THC but not opiates.  Repeat CMP from today revealed glucose of 116, calcium of 8.5, alkaline phosphatase of 32, albumin of 3.0, total protein of 6.0, total bili of 0.2 and was otherwise WNL.  Lipid panel revealed HDL of 31 and otherwise WNL.  TSH was 1.046.  Hemoglobin A1c was 5.2.  EKG performed on 08/25/2020 revealed sinus rhythm with rate of 81 and QT/QTc of 423/491.    Head CT without contrast performed on 08/25/2020 revealed no acute intracranial process.   Chest x-ray performed on 08/25/2020 revealed no active disease.  CT of the cervical spine performed on 08/25/2020 revealed no acute cervical spine fracture and mild spondylosis at C5-6.  Psychotherapy: Encourage participation in group therapy and therapeutic milieu.  Medications:.  Suboxone has been continued at 4 mg 3 times daily.  Clonazepam has been decreased to 0.5 mg twice daily as needed anxiety.  Depakote DR 250 mg twice daily.  Gabapentin 300 mg 3 times daily.  Seroquel 500 mg nightly.  The patient is also prescribed baclofen 10 mg 4 times daily as needed muscle spasms.  She is receiving Ensure supplements.  Consultations:    Discharge Concerns:    Estimated LOS: 5 to 7 days.  Other:     Physician Treatment Plan for Primary Diagnosis: MDD (major depressive disorder), recurrent episode, severe ([place]) Long Term Goal(s): Improvement in symptoms so as ready for discharge  Short Term Goals: Ability to identify changes in lifestyle to reduce recurrence of condition will improve, Ability to verbalize feelings will improve, Ability to disclose and discuss suicidal ideas, Ability to demonstrate  self-control will improve, Ability to identify and develop effective coping behaviors will improve, Compliance with prescribed medications will improve and Ability to identify triggers associated with substance abuse/mental health issues will improve  Physician Treatment Plan for Secondary Diagnosis: Principal Problem:   MDD (major depressive disorder), recurrent episode, severe ([place]) Active Problems:   Polysubstance abuse ([place])  Long Term Goal(s): Improvement in symptoms  so as ready for discharge  Short Term Goals: Ability to identify changes in lifestyle to reduce recurrence of condition will improve, Ability to verbalize feelings will improve, Ability to disclose and discuss suicidal ideas, Ability to demonstrate self-control will improve, Ability to identify and develop effective coping behaviors will improve and Ability to identify triggers associated with substance abuse/mental health issues will improve  I certify that inpatient services furnished can reasonably be expected to improve the patient's condition.    Arthor Captain, MD 5/6/20221:36 PM

## 2020-08-28 NOTE — Progress Notes (Signed)
NUTRITION ASSESSMENT  Pt identified as at risk on the Malnutrition Screen Tool  INTERVENTION: 1. Supplements: Ensure Enlive po BID, each supplement provides 350 kcal and 20 grams of protein   NUTRITION DIAGNOSIS: Unintentional weight loss related to sub-optimal intake as evidenced by pt report.   Goal: Pt to meet >/= 90% of their estimated nutrition needs.  Monitor:  PO intake  Assessment:  Pt admitted for SA. Pt with history of polysubstance abuse (cocaine, THC and meth most recently). Reports around 30 lbs of weight loss. Unable to verify in weight records in Epic.  Will order Ensure supplements for weight loss.   Height: Ht Readings from Last 1 Encounters:  08/27/20 5\' 7"  (1.702 m)    Weight: Wt Readings from Last 1 Encounters:  08/27/20 54 kg    Weight Hx: Wt Readings from Last 10 Encounters:  08/27/20 54 kg  08/25/20 59 kg    BMI:  Body mass index is 18.64 kg/m. Pt meets criteria for normal based on current BMI.  Estimated Nutritional Needs: Kcal: 25-30 kcal/kg Protein: > 1 gram protein/kg Fluid: 1 ml/kcal  Diet Order:  Diet Order            Diet regular Room service appropriate? Yes; Fluid consistency: Thin  Diet effective now                Pt is also offered choice of unit snacks mid-morning and mid-afternoon.  Pt is eating as desired.   Lab results and medications reviewed.   10/25/20, MS, RD, LDN Inpatient Clinical Dietitian Contact information available via Amion

## 2020-08-28 NOTE — Tx Team (Signed)
Interdisciplinary Treatment and Diagnostic Plan Update  08/28/2020 Time of Session: 9:35am [name] MRN: [id number]  Principal Diagnosis: MDD (major depressive disorder), recurrent episode, severe ([place])  Secondary Diagnoses: Principal Problem:   MDD (major depressive disorder), recurrent episode, severe ([place]) Active Problems:   Polysubstance abuse ([place])   Current Medications:  Current Facility-Administered Medications  Medication Dose Route Frequency Provider Last Rate Last Admin  . acetaminophen (TYLENOL) tablet 650 mg  650 mg Oral Q6H PRN Sharma Covert, MD      . alum & mag hydroxide-simeth (MAALOX/MYLANTA) 200-200-20 MG/5ML suspension 30 mL  30 mL Oral Q4H PRN Sharma Covert, MD      . baclofen (LIORESAL) tablet 10 mg  10 mg Oral QID PRN Sharma Covert, MD      . buprenorphine-naloxone (SUBOXONE) 2-0.5 mg per SL tablet 2 tablet  2 tablet Sublingual TID Sharma Covert, MD   2 tablet at 08/28/20 0757  . clonazePAM (KLONOPIN) tablet 0.5 mg  0.5 mg Oral BID PRN Arthor Captain, MD      . divalproex (DEPAKOTE) DR tablet 250 mg  250 mg Oral BID Sharma Covert, MD   250 mg at 08/28/20 0755  . feeding supplement (ENSURE ENLIVE / ENSURE PLUS) liquid 237 mL  237 mL Oral BID BM Sharma Covert, MD      . gabapentin (NEURONTIN) capsule 300 mg  300 mg Oral TID Sharma Covert, MD   300 mg at 08/28/20 0755  . magnesium hydroxide (MILK OF MAGNESIA) suspension 30 mL  30 mL Oral Daily PRN Sharma Covert, MD      . QUEtiapine (SEROQUEL) tablet 50 mg  50 mg Oral QHS Sharma Covert, MD   50 mg at 08/27/20 2122  . traZODone (DESYREL) tablet 50 mg  50 mg Oral QHS PRN Sharma Covert, MD   50 mg at 08/27/20 2123   PTA Medications: Medications Prior to Admission  Medication Sig Dispense Refill Last Dose  . baclofen (LIORESAL) 10 MG tablet Take 10 mg by mouth 4 (four) times daily as needed for muscle spasms.     . clonazePAM (KLONOPIN) 0.5 MG tablet Take 0.5 mg by  mouth 4 (four) times daily as needed for anxiety.     . divalproex (DEPAKOTE) 250 MG DR tablet Take 250 mg by mouth 2 (two) times daily.     Marland Kitchen gabapentin (NEURONTIN) 300 MG capsule Take 300 mg by mouth 3 (three) times daily.     . QUEtiapine (SEROQUEL) 50 MG tablet Take 50 mg by mouth at bedtime.     . SUBOXONE 8-2 MG FILM Place 1 Film under the tongue 3 (three) times daily.       Patient Stressors: Financial difficulties Marital or family conflict Occupational concerns Substance abuse  Patient Strengths: Capable of independent living Agricultural engineer for treatment/growth Special hobby/interest Supportive family/friends  Treatment Modalities: Medication Management, Group therapy, Case management,  1 to 1 session with clinician, Psychoeducation, Recreational therapy.   Physician Treatment Plan for Primary Diagnosis: MDD (major depressive disorder), recurrent episode, severe ([name]) Long Term Goal(s):     Short Term Goals:    Medication Management: Evaluate patient's response, side effects, and tolerance of medication regimen.  Therapeutic Interventions: 1 to 1 sessions, Unit Group sessions and Medication administration.  Evaluation of Outcomes: Not Met  Physician Treatment Plan for Secondary Diagnosis: Principal Problem:   MDD (major depressive disorder), recurrent episode, severe ([name]) Active Problems:   Polysubstance abuse (  [name])  Long Term Goal(s):     Short Term Goals:       Medication Management: Evaluate patient's response, side effects, and tolerance of medication regimen.  Therapeutic Interventions: 1 to 1 sessions, Unit Group sessions and Medication administration.  Evaluation of Outcomes: Not Met   RN Treatment Plan for Primary Diagnosis: MDD (major depressive disorder), recurrent episode, severe ([name]) Long Term Goal(s): Knowledge of disease and therapeutic regimen to maintain health will improve  Short Term Goals: Ability to demonstrate  self-control, Ability to participate in decision making will improve and Ability to verbalize feelings will improve  Medication Management: RN will administer medications as ordered by provider, will assess and evaluate patient's response and provide education to patient for prescribed medication. RN will report any adverse and/or side effects to prescribing provider.  Therapeutic Interventions: 1 on 1 counseling sessions, Psychoeducation, Medication administration, Evaluate responses to treatment, Monitor vital signs and CBGs as ordered, Perform/monitor CIWA, COWS, AIMS and Fall Risk screenings as ordered, Perform wound care treatments as ordered.  Evaluation of Outcomes: Not Met   LCSW Treatment Plan for Primary Diagnosis: MDD (major depressive disorder), recurrent episode, severe ([name]) Long Term Goal(s): Safe transition to appropriate next level of care at discharge, Engage patient in therapeutic group addressing interpersonal concerns.  Short Term Goals: Engage patient in aftercare planning with referrals and resources, Increase emotional regulation and Facilitate acceptance of mental health diagnosis and concerns  Therapeutic Interventions: Assess for all discharge needs, 1 to 1 time with Social worker, Explore available resources and support systems, Assess for adequacy in community support network, Educate family and significant other(s) on suicide prevention, Complete Psychosocial Assessment, Interpersonal group therapy.  Evaluation of Outcomes: Not Met   Progress in Treatment: Attending groups: No. Participating in groups: No. Taking medication as prescribed: Yes. Toleration medication: Yes. Family/Significant other contact made: No, will contact:  Husband Patient understands diagnosis: Yes. Discussing patient identified problems/goals with staff: Yes. Medical problems stabilized or resolved: Yes. Denies suicidal/homicidal ideation: Yes. Issues/concerns per patient  self-inventory: Yes. Other: None  New problem(s) identified: No, Describe:  None  New Short Term/Long Term Goal(s):medication stabilization, elimination of SI thoughts, development of comprehensive mental wellness plan.  Patient Goals:  "work on my Radiographer, therapeutic."  Discharge Plan or Barriers: Patient recently admitted. CSW will continue to follow and assess for appropriate referrals and possible discharge planning.  Reason for Continuation of Hospitalization: Depression Medication stabilization Suicidal ideation  Estimated Length of Stay: 3-5 days  Attendees: Patient: [name] 08/28/2020   Physician: Ethelene Browns, MD 08/28/2020   Nursing:  08/28/2020   RN Care Manager: 08/28/2020   Social Worker: Toney Reil, [place] 08/28/2020   Recreational Therapist:  08/28/2020   Other:  08/28/2020   Other:  08/28/2020   Other: 08/28/2020       Scribe for Treatment Team: Mliss Fritz, Latanya Presser 08/28/2020 11:36 AM

## 2020-08-29 DIAGNOSIS — F332 Major depressive disorder, recurrent severe without psychotic features: Principal | ICD-10-CM

## 2020-08-29 NOTE — Progress Notes (Signed)
[facility] MD Progress Note  08/29/2020 9:29 AM [name]  MRN:  [id number]   Subjective:  "I'm rested, just anxious.  If I could hear I could go home, things would be better."  40 yo female admitted after a suicide attempt where she tried to hang herself.  She regrets these actions and "things led up to it.  I promised my kids I would not do this."  Client has three sons, ages 20, 68, & 11 along with a 27 yo step-daughter.  She is upset today as she thinks about missing Mother's Day with her children and her oldest being home from college at this time.  Minimizes her actions and denies depression along with polysubstance abuse--stimulants and benzodiazepines.  History of opiate dependence with last use six months ago, maintaining on Suboxone TID.  She reports her anxiety "is through the roof" with a panic attack last night.  Lying in bed during the assessment and tearful when discussing her upcoming birthday and Mother's Day, encouraged to attend groups.  Her sleep was "pretty good" and appetite is "fine".  She is focused on discharge, encouraged her to work on her coping skills to prevent future issues and manage her anxiety better.  Denies side effects from her medications, Dr Fayrene Fearing continued her Suboxone and Klonopin (reduced from QID to BID).  No hallucinations, suicidal/homicidal ideations, hallucinations.  Anxiety increase most likely related to her recent stimulant abuse.  Principal Problem: Major depressive disorder, recurrent severe without psychotic features (HCC) Diagnosis: Principal Problem:   Major depressive disorder, recurrent severe without psychotic features ([facility]) Active Problems:   Polysubstance abuse ([facility])  Total Time spent with patient: 45 minutes  Past Psychiatric History: depression, polysubstance dependence including opiates, anxiety  Past Medical History:  Past Medical History:  Diagnosis Date  . Polysubstance abuse (H[facility]) 08/27/2020   History reviewed. No pertinent surgical  history. Family History: History reviewed. No pertinent family history. Family Psychiatric  History: none Social History:  Social History   Substance and Sexual Activity  Alcohol Use None     Social History   Substance and Sexual Activity  Drug Use Not on file    Social History   Socioeconomic History  . Marital status: Married    Spouse name: Not on file  . Number of children: Not on file  . Years of education: Not on file  . Highest education level: Not on file  Occupational History  . Not on file  Tobacco Use  . Smoking status: Current Every Day Smoker    Packs/day: 0.25    Types: Cigarettes  . Smokeless tobacco: Never Used  Substance and Sexual Activity  . Alcohol use: Not on file  . Drug use: Not on file  . Sexual activity: Not on file  Other Topics Concern  . Not on file  Social History Narrative  . Not on file   Social Determinants of Health   Financial Resource Strain: Not on file  Food Insecurity: Not on file  Transportation Needs: Not on file  Physical Activity: Not on file  Stress: Not on file  Social Connections: Not on file   Additional Social History: Lives with her husband, 3 boys, and step-daughter  Sleep: Good  Appetite:  Good  Current Medications: Current Facility-Administered Medications  Medication Dose Route Frequency Provider Last Rate Last Admin  . acetaminophen (TYLENOL) tablet 650 mg  650 mg Oral Q6H PRN Antonieta Pert, MD      . alum & mag hydroxide-simeth (MAALOX/MYLANTA)  200-200-20 MG/5ML suspension 30 mL  30 mL Oral Q4H PRN Antonieta Pert, MD      . baclofen (LIORESAL) tablet 10 mg  10 mg Oral QID PRN Antonieta Pert, MD      . buprenorphine-naloxone (SUBOXONE) 2-0.5 mg per SL tablet 2 tablet  2 tablet Sublingual TID Antonieta Pert, MD   2 tablet at 08/29/20 0900  . clonazePAM (KLONOPIN) tablet 0.5 mg  0.5 mg Oral BID PRN Claudie Revering, MD   0.5 mg at 08/28/20 1759  . divalproex (DEPAKOTE) DR tablet 250 mg  250  mg Oral BID Antonieta Pert, MD   250 mg at 08/29/20 0900  . feeding supplement (ENSURE ENLIVE / ENSURE PLUS) liquid 237 mL  237 mL Oral BID BM Antonieta Pert, MD   237 mL at 08/29/20 0902  . gabapentin (NEURONTIN) capsule 300 mg  300 mg Oral TID Antonieta Pert, MD   300 mg at 08/29/20 0900  . magnesium hydroxide (MILK OF MAGNESIA) suspension 30 mL  30 mL Oral Daily PRN Antonieta Pert, MD      . QUEtiapine (SEROQUEL) tablet 50 mg  50 mg Oral QHS Antonieta Pert, MD   50 mg at 08/28/20 2122    Lab Results:  Results for orders placed or performed during the hospital encounter of 08/27/20 (from the past 48 hour(s))  Comprehensive metabolic panel     Status: Abnormal   Collection Time: 08/28/20  9:28 AM  Result Value Ref Range   Sodium 142 135 - 145 mmol/L   Potassium 3.5 3.5 - 5.1 mmol/L   Chloride 108 98 - 111 mmol/L   CO2 25 22 - 32 mmol/L   Glucose, Bld 116 (H) 70 - 99 mg/dL    Comment: Glucose reference range applies only to samples taken after fasting for at least 8 hours.   BUN 16 6 - 20 mg/dL   Creatinine, Ser 1.61 0.44 - 1.00 mg/dL   Calcium 8.5 (L) 8.9 - 10.3 mg/dL   Total Protein 6.0 (L) 6.5 - 8.1 g/dL   Albumin 3.0 (L) 3.5 - 5.0 g/dL   AST 17 15 - 41 U/L   ALT 12 0 - 44 U/L   Alkaline Phosphatase 32 (L) 38 - 126 U/L   Total Bilirubin 0.2 (L) 0.3 - 1.2 mg/dL   GFR, Estimated >09 >60 mL/min    Comment: (NOTE) Calculated using the CKD-EPI Creatinine Equation (2021)    Anion gap 9 5 - 15    Comment: Performed at [facility], [address] [place], [place] [address]  Hemoglobin A1c     Status: None   Collection Time: 08/28/20  9:28 AM  Result Value Ref Range   Hgb A1c MFr Bld 5.2 4.8 - 5.6 %    Comment: (NOTE) Pre diabetes:          5.7%-6.4%  Diabetes:              >6.4%  Glycemic control for   <7.0% adults with diabetes    Mean Plasma Glucose 102.54 mg/dL    Comment: Performed at [facility], [address]  [place], [place] [address]  Lipid panel     Status: Abnormal   Collection Time: 08/28/20  9:28 AM  Result Value Ref Range   Cholesterol 122 0 - 200 mg/dL   Triglycerides 478 <295 mg/dL   HDL 31 (L) >62 mg/dL   Total CHOL/HDL Ratio 3.9 RATIO  VLDL 25 0 - 40 mg/dL   LDL Cholesterol 66 0 - 99 mg/dL    Comment:        Total Cholesterol/HDL:CHD Risk Coronary Heart Disease Risk Table                     Men   Women  1/2 Average Risk   3.4   3.3  Average Risk       5.0   4.4  2 X Average Risk   9.6   7.1  3 X Average Risk  23.4   11.0        Use the calculated Patient Ratio above and the CHD Risk Table to determine the patient's CHD Risk.        ATP III CLASSIFICATION (LDL):  <100     mg/dL   Optimal  [id number]  mg/dL   Near or Above                    Optimal  130-159  mg/dL   Borderline  [id number]  mg/dL   High  >440>190     mg/dL   Very High Performed at [facility], [address] [place], [place] [address]   TSH     Status: None   Collection Time: 08/28/20  9:28 AM  Result Value Ref Range   TSH 1.046 0.350 - 4.500 uIU/mL    Comment: Performed by a 3rd Generation assay with a functional sensitivity of <=0.01 uIU/mL. Performed at [facility], [address] [place], [place] [address]     Blood Alcohol level:  Lab Results  Component Value Date   ETH <10 08/25/2020    Metabolic Disorder Labs: Lab Results  Component Value Date   HGBA1C 5.2 08/28/2020   MPG 102.54 08/28/2020   No results found for: PROLACTIN Lab Results  Component Value Date   CHOL 122 08/28/2020   TRIG 124 08/28/2020   HDL 31 (L) 08/28/2020   CHOLHDL 3.9 08/28/2020   VLDL 25 08/28/2020   LDLCALC 66 08/28/2020    Physical Findings: AIMS: Facial and Oral Movements Muscles of Facial Expression: None, normal Lips and Perioral Area: None, normal Jaw: None, normal Tongue: None, normal,Extremity Movements Upper (arms, wrists, hands, fingers): None, normal Lower  (legs, knees, ankles, toes): None, normal, Trunk Movements Neck, shoulders, hips: None, normal, Overall Severity Severity of abnormal movements (highest score from questions above): None, normal Incapacitation due to abnormal movements: None, normal Patient's awareness of abnormal movements (rate only patient's report): No Awareness, Dental Status Current problems with teeth and/or dentures?: Yes Does patient usually wear dentures?: No  CIWA:    COWS:     Musculoskeletal: Strength & Muscle Tone: within normal limits Gait & Station: normal Patient leans: N/A  Psychiatric Specialty Exam:  Presentation  General Appearance: Disheveled  Eye Contact:Good  Speech:Clear and Coherent; Other (comment) (Verbose but not pressured)  Speech Volume:Normal  Handedness: Right  Mood and Affect  Mood:Dysphoric; Depressed; Anxious  Affect:Congruent; Tearful; Depressed  Thought Process  Thought Processes:Coherent  Descriptions of Associations:  Logical  Orientation:Full (Time, Place and Person)  Thought Content:Logical  History of Schizophrenia/Schizoaffective disorder:No  Duration of Psychotic Symptoms: N/A Hallucinations: None Ideas of Reference:None  Suicidal Thoughts: Denies Homicidal Thoughts: Denies  Sensorium  Memory:Immediate Fair; Recent Fair; Remote Fair  Judgment:  Poor  Insight:Shallow  Executive Functions  Concentration:Fair  Attention Span:Fair  Recall:Fair  Fund of Knowledge:Fair  Language:Good  Psychomotor Activity  Psychomotor Activity: Decreased  Assets  Assets:Communication Skills; Desire for Improvement; Housing; Resilience; Social Support  Sleep  Sleep: Good  Physical Exam: Physical Exam Vitals and nursing note reviewed.  Constitutional:      Appearance: Normal appearance.  HENT:     Head: Normocephalic.     Nose: Nose normal.  Pulmonary:     Effort: Pulmonary effort is normal.  Musculoskeletal:        General: Normal range of  motion.     Cervical back: Normal range of motion.  Neurological:     General: No focal deficit present.     Mental Status: She is alert and oriented to person, place, and time.  Psychiatric:        Attention and Perception: Attention and perception normal.        Mood and Affect: Mood is anxious. Affect is blunt.        Speech: Speech normal.        Behavior: Behavior normal. Behavior is cooperative.        Thought Content: Thought content normal.        Cognition and Memory: Cognition and memory normal.        Judgment: Judgment is impulsive.    Review of Systems  Psychiatric/Behavioral: Positive for substance abuse. The patient is nervous/anxious.   All other systems reviewed and are negative.  Blood pressure 95/71, pulse 98, temperature 98 F (36.7 C), temperature source Oral, resp. rate 18, height 5\' 7"  (1.702 m), weight 54 kg, SpO2 97 %. Body mass index is 18.64 kg/m.   Treatment Plan Summary: Daily contact with patient to assess and evaluate symptoms and progress in treatment, Medication management and Plan :  Major depressive disorder, recurrent, severe without psychosis: -Continue Depakote 250 mg BID (self-report of bipolar, difficult to differentiate based on stimulant abuse_  Polysubstance Use Disorder, including opiates: -Continued Suboxone TID per Dr -Encourage 12-step program with a sponsor  Anxiety: -Continued Klonopin 0.5 mg BID PRN decreased from QID by Dr Fayrene Fearing on admission -Continue gabapentin 300 mg TID  Insomnia: -Continue Seroquel 50 mg at bedtime  Fayrene Fearing, NP 08/29/2020, 9:29 AM

## 2020-08-29 NOTE — Progress Notes (Signed)
Pt is calm and cooperative, denies SI/HI, AVH, took all her meds as offered, will continue to monitor  08/28/20 2245  Psych Admission Type (Psych Patients Only)  Admission Status Involuntary  Psychosocial Assessment  Patient Complaints Anxiety  Eye Contact Fair  Facial Expression Sad  Affect Appropriate to circumstance  Speech Logical/coherent;Soft  Interaction Assertive  Motor Activity Fidgety;Restless  Appearance/Hygiene Improved  Behavior Characteristics Cooperative;Calm  Mood Pleasant  Aggressive Behavior  Targets Self  Type of Behavior Other (Comment)  Effect No apparent injury  Thought Process  Coherency Concrete thinking  Content Blaming self;Blaming others  Delusions None reported or observed  Perception WDL  Hallucination None reported or observed  Judgment Poor  Confusion None  Danger to Self  Current suicidal ideation? Denies  Danger to Others  Danger to Others None reported or observed

## 2020-08-29 NOTE — [facility] Group Notes (Signed)
.  Psychoeducational Group Note  Date: 08/29/2020 Time: 0900-1000    Goal Setting   Purpose of Group: This group helps to provide patients with the steps of setting a goal that is specific, measurable, attainable, realistic and time specific. A discussion on how we keep ourselves stuck with negative self talk.    Participation Level:  Pt did not come to group.   Dione Housekeeper

## 2020-08-29 NOTE — Progress Notes (Signed)
[place] NOVEL CORONAVIRUS (COVID-19) DAILY CHECK-OFF SYMPTOMS - answer yes or no to each - every day NO YES  Have you had a fever in the past 24 hours?  . Fever (Temp > 37.80C / 100F) X   Have you had any of these symptoms in the past 24 hours? . New Cough .  Sore Throat  .  Shortness of Breath .  Difficulty Breathing .  Unexplained Body Aches   X   Have you had any one of these symptoms in the past 24 hours not related to allergies?   . Runny Nose .  Nasal Congestion .  Sneezing   X   If you have had runny nose, nasal congestion, sneezing in the past 24 hours, has it worsened?  X   EXPOSURES - check yes or no X   Have you traveled outside the state in the past 14 days?  X   Have you been in contact with someone with a confirmed diagnosis of COVID-19 or PUI in the past 14 days without wearing appropriate PPE?  X   Have you been living in the same home as a person with confirmed diagnosis of COVID-19 or a PUI (household contact)?    X   Have you been diagnosed with COVID-19?    X              What to do next: Answered NO to all: Answered YES to anything:   Proceed with unit schedule Follow the [facility] Inpatient Flowsheet.   

## 2020-08-29 NOTE — Progress Notes (Signed)
The focus of this group is to help patients establish daily goals to achieve during treatment and discuss how the patient can incorporate goal setting into their daily lives to aide in recovery. 

## 2020-08-29 NOTE — [facility] Group Notes (Signed)
[facility] Group Notes:  (Nursing/MHT/Case Management/Adjunct)  Date:  08/29/2020  Time:  10:43 PM  Type of Therapy:  Wrap up group  Participation Level:  Minimal  Participation Quality:  Drowsy  Affect:  Depressed  Cognitive:  Appropriate  Insight:  Improving  Engagement in Group:  Supportive  Modes of Intervention:  Discussion and Education  Summary of Progress/Problems:  Unit rules and fall risk interventions reviewed.  She reported her day was 6/10 (10 the best).  She was able to accomplish her goal of "work harder on being present."    [name] 08/29/2020, 10:43 PM

## 2020-08-29 NOTE — [facility] Group Notes (Signed)
.  Psychoeducational Group Note    Date:08/29/2020 Time: 1300-1400    Life Skills:  A group where two lists are made. What people need and what are things that we do that are healthy. The lists are developed by the patients and it is explained that we often do the actions that are not healthy to get our list of needs met.   Purpose of Group: . The group focus' on teaching patients on how to identify their needs and how to develop the coping skills needed to get their needs met  Participation Level:  Active  Participation Quality:  Appropriate  Affect:  Appropriate  Cognitive:  Oriented  Insight:  Improving  Engagement in Group:  Engaged  Additional Comments:  .Did not attend Paulino Rily

## 2020-08-29 NOTE — Progress Notes (Addendum)
   08/29/20 1300  Psych Admission Type (Psych Patients Only)  Admission Status Involuntary  Psychosocial Assessment  Patient Complaints Anxiety  Eye Contact Fair  Facial Expression Sad  Affect Appropriate to circumstance  Speech Logical/coherent;Soft  Interaction Assertive  Motor Activity Fidgety  Appearance/Hygiene Improved  Behavior Characteristics Cooperative;Calm  Mood Pleasant  Aggressive Behavior  Targets Self  Type of Behavior Other (Comment)  Effect No apparent injury  Thought Process  Coherency Concrete thinking  Content Blaming self;Blaming others  Delusions None reported or observed  Perception WDL  Hallucination None reported or observed  Judgment Poor  Confusion None  Danger to Self  Current suicidal ideation? Denies  Danger to Others  Danger to Others None reported or observed  D. Pt presents as friendly, remained in bed for much of the morning, but did attend group this afternoon. Pt observed interacting appropriately with peers. Pt currently denies SI/HI and AVH  A. Labs and vitals monitored. Pt given and educated on medications. Pt supported emotionally and encouraged to express concerns and ask questions.   R. Pt remains safe with 15 minute checks. Will continue POC.

## 2020-08-30 MED ORDER — BISACODYL 5 MG PO TBEC
10.0000 mg | DELAYED_RELEASE_TABLET | Freq: Once | ORAL | Status: AC
  Administered 2020-08-30: 10 mg via ORAL
  Filled 2020-08-30 (×2): qty 2

## 2020-08-30 MED ORDER — PROMETHAZINE HCL 25 MG PO TABS: 25.0000 mg | ORAL_TABLET | Freq: Three times a day (TID) | ORAL | Status: DC | PRN

## 2020-08-30 MED ORDER — SENNA 8.6 MG PO TABS
1.0000 | ORAL_TABLET | Freq: Every day | ORAL | Status: DC
  Administered 2020-08-31: 8.6 mg via ORAL
  Filled 2020-08-30 (×3): qty 1

## 2020-08-30 NOTE — Progress Notes (Signed)
D. Pt presented with an anxious affect/ depressed mood- reported feeling down this am due to her not being with her kids on Mother's Day. Pt did stay in bed for much of the morning, which pt reported was due to her hemorrhoids bothering her, and that it was uncomfortable to sit on the hard chairs in the dayroom. Pt did have a BM this afternoon, getting some relief from her constipation, and was observed in the dayroom attending group. Pt currently denies SI/HI and AVH  A. Labs and vitals monitored. Pt given and educated on medications. Pt supported emotionally and encouraged to express concerns and ask questions.   R. Pt remains safe with 15 minute checks. Will continue POC.

## 2020-08-30 NOTE — Progress Notes (Signed)
[place] NOVEL CORONAVIRUS (COVID-19) DAILY CHECK-OFF SYMPTOMS - answer yes or no to each - every day NO YES  Have you had a fever in the past 24 hours?  . Fever (Temp > 37.80C / 100F) X   Have you had any of these symptoms in the past 24 hours? . New Cough .  Sore Throat  .  Shortness of Breath .  Difficulty Breathing .  Unexplained Body Aches   X   Have you had any one of these symptoms in the past 24 hours not related to allergies?   . Runny Nose .  Nasal Congestion .  Sneezing   X   If you have had runny nose, nasal congestion, sneezing in the past 24 hours, has it worsened?  X   EXPOSURES - check yes or no X   Have you traveled outside the state in the past 14 days?  X   Have you been in contact with someone with a confirmed diagnosis of COVID-19 or PUI in the past 14 days without wearing appropriate PPE?  X   Have you been living in the same home as a person with confirmed diagnosis of COVID-19 or a PUI (household contact)?    X   Have you been diagnosed with COVID-19?    X              What to do next: Answered NO to all: Answered YES to anything:   Proceed with unit schedule Follow the [facility] Inpatient Flowsheet.   

## 2020-08-30 NOTE — Progress Notes (Signed)
Patient has been up and active on the unit, attended group this evening and has voiced no complaints. Patient currently denies having pain, -si/hi/a/v hall. Support and encouragement offered, safety maintained on unit, will continue to monitor.  

## 2020-08-30 NOTE — Progress Notes (Signed)
[facility] MD Progress Note  08/30/2020 1:34 PM [name]  MRN:  [id number]   Subjective: [name] reports, "I'm a little depressed today because it is mother's day. I'm not with my kids because I'm here. I think I'm dealing with anxiety more than depression because I want to be home. But, my depression is better. I'm regretful about the suicide attempt. I promised myself that I will never do that again".   Objective: [name] a 40 year old female with a self-reported history of bipolar disorder and polysubstance use disorder who was admitted after initial presentation to [facility], ED via [facility] after [name] attempted to hang herself in her closet.  Daily notes: [name] is seen, chart reviewed. The chart findings discussed with the treatment team. [name] presents alert, oriented & aware of situation. [name] is visible on the unit, attending group sessions. [name] is making a good eye contact. [name] reports feeling more anxious than depressed today because today is mother's day & [name] is here instead of her home with her children. [name] says [name] is feeling a lot of guilt & regretted her attempt to hang herself. [name] says [name] would have hurt her children more because 2 of her children's father was murdered in 2005. [name] says [name] has made a promise to herself to never attempt suicide again.  [name] is lying down in bed during this assessment. [name] presents tearful when mentioning her children. [name] is encouraged to attend group sessions. [name] says her mood is improving. Reports good appetite. [name] presents essentially thin-framed.  [name] is focused on discharge, encouraged her to work on her coping skills to prevent future issues and manage her anxiety better.  Denies side effects from her medications. Denies any hallucinations, suicidal/homicidal ideations, delusions or paranoia. [name] does not appear to be responding to any internal stimuli. [name] is in agreement to continue current plan of care as already in progress. Rates  anxiety #4, Depression #3. Complained of constipation. given dulcolax 10 mg po once & senokot ordered starting tomorrow.  Principal Problem: Major depressive disorder, recurrent severe without psychotic features (HCC) Diagnosis: Principal Problem:   Major depressive disorder, recurrent severe without psychotic features (HCC) Active Problems:   Polysubstance abuse (HCC)  Total Time spent with patient: 15 minutes  Past Psychiatric History: Depression, polysubstance dependence including opiates, anxiety  Past Medical History:  Past Medical History:  Diagnosis Date  . Polysubstance abuse (HCC) 08/27/2020   History reviewed. No pertinent surgical history.  Family History: History reviewed. No pertinent family history.  Family Psychiatric  History: See H&P  Social History:  Social History   Substance and Sexual Activity  Alcohol Use None     Social History   Substance and Sexual Activity  Drug Use Not on file    Social History   Socioeconomic History  . Marital status: Married    Spouse name: Not on file  . Number of children: Not on file  . Years of education: Not on file  . Highest education level: Not on file  Occupational History  . Not on file  Tobacco Use  . Smoking status: Current Every Day Smoker    Packs/day: 0.25    Types: Cigarettes  . Smokeless tobacco: Never Used  Substance and Sexual Activity  . Alcohol use: Not on file  . Drug use: Not on file  . Sexual activity: Not on file  Other Topics Concern  . Not on file  Social History Narrative  . Not on file  Social Determinants of Health   Financial Resource Strain: Not on file  Food Insecurity: Not on file  Transportation Needs: Not on file  Physical Activity: Not on file  Stress: Not on file  Social Connections: Not on file   Additional Social History: Lives with her husband, 3 boys, and step-daughter  Sleep: Good  Appetite:  Good  Current Medications: Current Facility-Administered  Medications  Medication Dose Route Frequency Provider Last Rate Last Admin  . acetaminophen (TYLENOL) tablet 650 mg  650 mg Oral Q6H PRN Antonieta Pert, MD      . alum & mag hydroxide-simeth (MAALOX/MYLANTA) 200-200-20 MG/5ML suspension 30 mL  30 mL Oral Q4H PRN Antonieta Pert, MD      . baclofen (LIORESAL) tablet 10 mg  10 mg Oral QID PRN Antonieta Pert, MD      . buprenorphine-naloxone (SUBOXONE) 2-0.5 mg per SL tablet 2 tablet  2 tablet Sublingual TID Antonieta Pert, MD   2 tablet at 08/30/20 1119  . clonazePAM (KLONOPIN) tablet 0.5 mg  0.5 mg Oral BID PRN Claudie Revering, MD   0.5 mg at 08/30/20 1119  . divalproex (DEPAKOTE) DR tablet 250 mg  250 mg Oral BID Antonieta Pert, MD   250 mg at 08/30/20 0753  . feeding supplement (ENSURE ENLIVE / ENSURE PLUS) liquid 237 mL  237 mL Oral BID BM Antonieta Pert, MD   237 mL at 08/30/20 0754  . gabapentin (NEURONTIN) capsule 300 mg  300 mg Oral TID Antonieta Pert, MD   300 mg at 08/30/20 1119  . magnesium hydroxide (MILK OF MAGNESIA) suspension 30 mL  30 mL Oral Daily PRN Antonieta Pert, MD   30 mL at 08/29/20 1823  . QUEtiapine (SEROQUEL) tablet 50 mg  50 mg Oral QHS Antonieta Pert, MD   50 mg at 08/29/20 2111   Lab Results:  No results found for this or any previous visit (from the past 48 hour(s)).  Blood Alcohol level:  Lab Results  Component Value Date   ETH <10 08/25/2020   Metabolic Disorder Labs: Lab Results  Component Value Date   HGBA1C 5.2 08/28/2020   MPG 102.54 08/28/2020   No results found for: PROLACTIN Lab Results  Component Value Date   CHOL 122 08/28/2020   TRIG 124 08/28/2020   HDL 31 (L) 08/28/2020   CHOLHDL 3.9 08/28/2020   VLDL 25 08/28/2020   LDLCALC 66 08/28/2020   Physical Findings: AIMS: Facial and Oral Movements Muscles of Facial Expression: None, normal Lips and Perioral Area: None, normal Jaw: None, normal Tongue: None, normal,Extremity Movements Upper (arms, wrists,  hands, fingers): None, normal Lower (legs, knees, ankles, toes): None, normal, Trunk Movements Neck, shoulders, hips: None, normal, Overall Severity Severity of abnormal movements (highest score from questions above): None, normal Incapacitation due to abnormal movements: None, normal Patient's awareness of abnormal movements (rate only patient's report): No Awareness, Dental Status Current problems with teeth and/or dentures?: Yes Does patient usually wear dentures?: No  CIWA:    COWS:     Musculoskeletal: Strength & Muscle Tone: within normal limits Gait & Station: normal Patient leans: N/A  Psychiatric Specialty Exam:  Presentation  General Appearance: Disheveled  Eye Contact:Good  Speech:Clear and Coherent; Other (comment) (Verbose but not pressured)  Speech Volume:Normal  Handedness: Right  Mood and Affect  Mood:Dysphoric; Depressed; Anxious  Affect:Congruent; Tearful; Depressed  Thought Process  Thought Processes:Coherent  Descriptions of Associations:  Logical  Orientation:Full (Time,  Place and Person)  Thought Content:Logical  History of Schizophrenia/Schizoaffective disorder:No  Duration of Psychotic Symptoms: N/A Hallucinations: None Ideas of Reference:None  Suicidal Thoughts: Denies Homicidal Thoughts: Denies  Sensorium  Memory:Immediate Fair; Recent Fair; Remote Fair  Judgment:  Poor  Insight:Shallow  Executive Functions  Concentration:Fair  Attention Span:Fair  Recall:Fair  Fund of Knowledge:Fair  Language:Good  Psychomotor Activity  Psychomotor Activity: Decreased  Assets  Assets:Communication Skills; Desire for Improvement; Housing; Resilience; Social Support  Sleep  Sleep: Good  Physical Exam: Physical Exam Vitals and nursing note reviewed.  Constitutional:      Appearance: Normal appearance.  HENT:     Head: Normocephalic.     Nose: Nose normal.  Pulmonary:     Effort: Pulmonary effort is normal.   Musculoskeletal:        General: Normal range of motion.     Cervical back: Normal range of motion.  Neurological:     General: No focal deficit present.     Mental Status: [name] is alert and oriented to person, place, and time.  Psychiatric:        Attention and Perception: Attention and perception normal.        Mood and Affect: Mood is anxious. Affect is blunt.        Speech: Speech normal.        Behavior: Behavior normal. Behavior is cooperative.        Thought Content: Thought content normal.        Cognition and Memory: Cognition and memory normal.        Judgment: Judgment is impulsive.    Review of Systems  Psychiatric/Behavioral: Positive for substance abuse. The patient is nervous/anxious.   All other systems reviewed and are negative.  Blood pressure 109/60, pulse 92, temperature 98.1 F (36.7 C), temperature source Oral, resp. rate 18, height 5\' 7"  (1.702 m), weight 54 kg, SpO2 98 %. Body mass index is 18.64 kg/m.  Treatment Plan Summary: Daily contact with patient to assess and evaluate symptoms and progress in treatment, Medication management and Plan :   Continue inpatient hospitalization. Major depressive disorder, recurrent, severe without psychosis:  Mood control/stabilization. -Continue Depakote 250 mg po BID. -Continue Seroquel 50 mg at bedtime  Opioid dependence: -Continued Suboxone 2-0.5 mg sublinguallyTID  -Encourage 12-step program with a sponsor  Anxiety/agitation: -Continue Klonopin 0.5 mg BID PRN. -Continue gabapentin 300 mg TID  Other medical complaints: Continue  baclofen 10 mg po qid prn for muscle spasms. Give Dulcolax 10 mg po once for c/o constipation. Senokot 8.6 mg po daily for constipation.  Mom 30 ml po Qd prn for constipation.  Mylanta 30 ml po Q 4 hrs prn for indigestion.  Encourage group participation.  Discharge disposition plan in progress.  , NP, fnp, fnp-bc 08/30/2020, 1:34 PMPatient ID: 10/30/2020, female    DOB: 09/27/1980, 40 y.o.   MRN: [id number]

## 2020-08-30 NOTE — Progress Notes (Signed)
[facility] Group Notes:  (Nursing/MHT/Case Management/Adjunct)  Date:  08/30/2020  Time: 2015  Type of Therapy:  wrap up group  Participation Level:  Active  Participation Quality:  Appropriate, Attentive, Sharing and Supportive  Affect:  Depressed  Cognitive:  Alert  Insight:  Improving  Engagement in Group:  Engaged  Modes of Intervention:  Clarification, Education and Support  Summary of Progress/Problems: Positive thinking and self-care were discussed. [name] S 08/30/2020, 9:07 PM

## 2020-08-30 NOTE — [facility] Group Notes (Signed)
Adult Psychoeducational Group Not Date:  08/30/2020 Time:  0900-1045 Group Topic/Focus: PROGRESSIVE RELAXATION. A group where deep breathing is taught and tensing and relaxation muscle groups is used. Imagery is used as well.  Pts are asked to imagine 3 pillars that hold them up when they are not able to hold themselves up.  Participation Level:  Did not attend   [name] A    

## 2020-08-30 NOTE — Progress Notes (Signed)
   08/30/20 2115  COVID-19 Daily Checkoff  Have you had a fever (temp > 37.80C/100F)  in the past 24 hours?  No  If you have had runny nose, nasal congestion, sneezing in the past 24 hours, has it worsened? No  COVID-19 EXPOSURE  Have you traveled outside the state in the past 14 days? No  Have you been in contact with someone with a confirmed diagnosis of COVID-19 or PUI in the past 14 days without wearing appropriate PPE? No  Have you been living in the same home as a person with confirmed diagnosis of COVID-19 or a PUI (household contact)? No  Have you been diagnosed with COVID-19? No

## 2020-08-31 MED ORDER — GABAPENTIN 300 MG PO CAPS: 300.0000 mg | ORAL_CAPSULE | Freq: Three times a day (TID) | ORAL | 0 refills | Status: AC

## 2020-08-31 MED ORDER — DIVALPROEX SODIUM 250 MG PO DR TAB: 250.0000 mg | DELAYED_RELEASE_TABLET | Freq: Two times a day (BID) | ORAL | 0 refills | Status: DC

## 2020-08-31 MED ORDER — CLONAZEPAM 0.5 MG PO TABS: 0.5000 mg | ORAL_TABLET | Freq: Two times a day (BID) | ORAL | 0 refills | Status: AC | PRN

## 2020-08-31 MED ORDER — QUETIAPINE FUMARATE 50 MG PO TABS: 50.0000 mg | ORAL_TABLET | Freq: Every day | ORAL | 0 refills | Status: AC

## 2020-08-31 NOTE — [facility] Suicide Risk Assessment (Signed)
[facility] INPATIENT:  Family/Significant Other Suicide Prevention Education  Suicide Prevention Education:  Education Completed; Aunya Lemler [phone] (Husband) has been identified by the patient as the family member/significant other with whom the patient will be residing, and identified as the person(s) who will aid the patient in the event of a mental health crisis (suicidal ideations/suicide attempt).  With written consent from the patient, the family member/significant other has been provided the following suicide prevention education, prior to the and/or following the discharge of the patient.  The suicide prevention education provided includes the following:  Suicide risk factors  Suicide prevention and interventions  National Suicide Hotline telephone number  [facility] assessment telephone number  [place] Emergency Assistance 911  [facility] and/or Residential Mobile Crisis Unit telephone number  Request made of family/significant other to:  Remove weapons (e.g., guns, rifles, knives), all items previously/currently identified as safety concern.    Remove drugs/medications (over-the-counter, prescriptions, illicit drugs), all items previously/currently identified as a safety concern.  The family member/significant other verbalizes understanding of the suicide prevention education information provided.  The family member/significant other agrees to remove the items of safety concern listed above.  CSW spoke with Mr. Larranaga who states that his wife took "a little bit of Methamphetamines and became paranoid".  Mr. Clavin states that she was mostly paranoid about his child's mother being in the home.  Mr. Micallef states that his wife can come back home and that he has no concerns at this time.  Mr. Gadea states that there are no weapons or firearms in the home.  CSW completed SPE with Mr. Bertini.   Met[place] Kung Clif Serio 08/31/2020, 2:54 PM

## 2020-08-31 NOTE — [facility] Group Notes (Signed)
Type of Therapy and Topic: Group Therapy: Anger Management   Participation: Attended   Description of Group: In this group, patients will learn helpful strategies and techniques to manage anger, express anger in alternative ways, change hostile attitudes, and prevent aggressive acts, such as verbal abuse and violence.This group will be process-oriented and eductional, with patients participating in exploration of their own experiences as well as giving and receiving support and challenge from other group members.  Therapeutic Goals: 1. Patient will learn to manage anger. 2. Patient will learn to stop violence or the threat of violence. 3. Patient will learn to develop self control over thoughts and actions. 4. Patient will receive support and feedback from others  Therapeutic Modalities: Cognitive Behavioral Therapy Solution Focused Therapy Motivational Interviewing 

## 2020-08-31 NOTE — Progress Notes (Signed)
Recreation Therapy Notes  Date:  5.9.22 Time: 0930 Location: [address] Dayroom  Group Topic: Stress Management  Goal Area(s) Addresses:  Patient will identify positive stress management techniques. Patient will identify benefits of using stress management post d/c.  Behavioral Response: Engaged, Attentive  Intervention: Stress Management  Activity :  Meditation.  LRT played a meditation that focused on the importance of self esteem.  Patients were to listen and follow along as the meditation played to fully engage in meditation.    Education:  Stress Management, Discharge Planning.   Education Outcome: Acknowledges Education  Clinical Observations/Feedback: Pt attended group session.    Caroll Rancher, LRT/CTRS         Lillia Abed, Kamry Faraci A 08/31/2020 11:05 AM

## 2020-08-31 NOTE — Progress Notes (Signed)
D: Pt A & O X 4. Denies SI, HI, AVH and pain at this time. D/C home as ordered. Picked up in lobby by  A: D/C instructions reviewed with pt including prescriptions, medication samples and follow up appointment, compliance encouraged. All belongings from assigned locker including home medications was returned to pt at time of departure. Scheduled and PRN medications given with verbal education and effects monitored. Safety checks maintained without incident till time of d/c.  R: Pt receptive to care. Compliant with medications when offered. Denies adverse drug reactions when assessed. Verbalized understanding related to d/c instructions. Signed belonging sheet in agreement with items received from locker. Ambulatory with a steady gait. Affect is bright and pt was is in no physical distress at time of departure.

## 2020-08-31 NOTE — Progress Notes (Signed)
  [facility] Adult Case Management Discharge Plan :  Will you be returning to the same living situation after discharge:  Yes,  Home At discharge, do you have transportation home?: Yes,  Father Do you have the ability to pay for your medications: Yes,  Medicaid   Release of information consent forms completed and in the chart;  Patient's signature needed at discharge.  Patient to Follow up at:  Follow-up Information    [facility] Go on 09/08/2020.   Specialty: Behavioral Health Why: You have an appointment to start SAIOP, substance abuse intensive outpatient therapy, and medication management services on 09/08/20 at 11:00 am.   This appointment will be held in person. Contact information: [address] [place] [address] [phone]              Next level of care provider has access to [facility] Link:yes  Safety Planning and Suicide Prevention discussed: Yes,  with patient and husband      Has patient been referred to the Quitline?: Patient refused referral  Patient has been referred for addiction treatment: Yes  Aram Beecham, LCSWA 08/31/2020, 2:10 PM

## 2020-08-31 NOTE — [facility] Suicide Risk Assessment (Signed)
[facility] Discharge Suicide Risk Assessment   Principal Problem: Major depressive disorder, recurrent severe without psychotic features [facility]) Discharge Diagnoses: Principal Problem:   Major depressive disorder, recurrent severe without psychotic features (HCC) Active Problems:   Polysubstance abuse (HCC)   Total Time spent with patient: 20 minutes  Musculoskeletal: Strength & Muscle Tone: within normal limits Gait & Station: normal Patient leans: N/A  Psychiatric Specialty Exam  Presentation  General Appearance: Appropriate for Environment; Neat  Eye Contact:Good  Speech:Clear and Coherent; Normal Rate  Speech Volume:Normal  Handedness:Right   Mood and Affect  Mood:Euthymic  Duration of Depression Symptoms: Less than two weeks  Affect:Appropriate; Congruent   Thought Process  Thought Processes:Coherent; Goal Directed; Linear  Descriptions of Associations:Intact  Orientation:Full (Time, Place and Person)  Thought Content:Logical  History of Schizophrenia/Schizoaffective disorder:No  Duration of Psychotic Symptoms:No data recorded Hallucinations:Hallucinations: None  Ideas of Reference:None  Suicidal Thoughts:Suicidal Thoughts: No  Homicidal Thoughts:Homicidal Thoughts: No   Sensorium  Memory:Immediate Good; Recent Good; Remote Good  Judgment:Good  Insight:Fair   Executive Functions  Concentration:Good  Attention Span:Good  Recall:Good  Fund of Knowledge:Good  Language:Good   Psychomotor Activity  Psychomotor Activity:Psychomotor Activity: Normal   Assets  Assets:Communication Skills; Desire for Improvement; Housing; Physical Health; Resilience; Social Support   Sleep  Sleep:Sleep: Good Number of Hours of Sleep: 6.5   Physical Exam: Physical Exam Vitals and nursing note reviewed.  HENT:     Head: Normocephalic and atraumatic.  Neurological:     General: No focal deficit present.     Mental Status: She is alert and oriented to  person, place, and time.    Review of Systems  Constitutional: Negative.   Respiratory: Negative.   Cardiovascular: Negative.   Gastrointestinal: Negative.   Musculoskeletal: Negative.   Skin: Negative.   Neurological: Negative.   Psychiatric/Behavioral: Negative for depression, hallucinations and suicidal ideas. The patient is not nervous/anxious and does not have insomnia.    Blood pressure 103/63, pulse 86, temperature 98.5 F (36.9 C), temperature source Oral, resp. rate 16, height 5\' 7"  (1.702 m), weight 54 kg, SpO2 99 %. Body mass index is 18.64 kg/m.  Mental Status Per Nursing Assessment::   On Admission:  Self-harm thoughts  Demographic Factors:  Caucasian and Unemployed  Loss Factors: Financial problems/change in socioeconomic status  Historical Factors: Prior suicide attempts, Family history of suicide, Family history of mental illness or substance abuse and Impulsivity  Risk Reduction Factors:   Responsible for children under 50 years of age, Sense of responsibility to family, Living with another person, especially a relative and Positive social support  Continued Clinical Symptoms:  Anxiety - improved Depression - improved Alcohol/Substance Abuse/Dependencies More than one psychiatric diagnosis Previous Psychiatric Diagnoses and Treatments  Cognitive Features That Contribute To Risk:  None    Suicide Risk:  Minimal: No identifiable suicidal ideation.  Patients presenting with no risk factors but with morbid ruminations; may be classified as minimal risk based on the severity of the depressive symptoms   Follow-up Information    [facility]. Go on 09/08/2020.   Specialty: Behavioral Health Why: You have an appointment to start SAIOP, substance abuse intensive outpatient therapy, and medication management services on 09/08/20 at 11:00 am.   This appointment will be held in person. Contact information: [address] [place]  [place] [phone]              Plan Of Care/Follow-up recommendations:  Activity:  As tolerated  Other:   -  Take medications as prescribed.   - Do not drink alcohol.  Do not use marijuana or other drugs.   - Attend substance abuse treatment program.  - Keep outpatient mental health follow-up appointments with psychiatrist and therapist.   - Follow-up with primary care provider regarding management of medical problems.    Claudie Revering, MD 08/31/2020, 4:21 PM

## 2020-08-31 NOTE — Discharge Summary (Signed)
Physician Discharge Summary Note  Patient:  [name] is an 40 y.o., female MRN:  [id number] DOB:  27-Oct-1980 Patient phone:  [phone] (home)  Patient address:   [address] [place] [address],  Total Time spent with patient: 30 minutes  Date of Admission:  08/27/2020 Date of Discharge: 08/31/2020  Reason for Admission:  (From MD's admission note): [name] a 40 year old female with a self-reported history of bipolar disorder and polysubstance use disorder who was admitted after initial presentation to [facility], ED via [facility] after she attempted to hang herself in her closet. Patient reports that for at least 1 to 2 weeks prior to admission she started having some trouble comprehending what was being said during conversations. She states that she may have been mixing up her medications. In the 1 to 2 weeks prior to admission the patient also experienced symptoms of anhedonia, hypersomnia, trouble getting out of bed and getting dressed, low motivation, feeling numb, decreased appetite, problems with concentration, low self-esteem, low confidence in the context of stressors related to her children, marriage and recent conflict with her father-in-law. Patient states her father-in-law made some insulting remarks about a relationship that she had prior to her relationship with her husband and that father-in-law also insinuated that the patient should killherself. Patient got upset and went into the closet where she sat with her anger and other emotions. She reports that she started to tie tank tops together but was not suicidal and subsequently she tried to hang herself with a tank top around her neck suspended from the door. When it slid off the door she also put an electrical cord around her neck in an attempt to hang herself. Patient's husband had to cut patient loose and the family called EMS. Upon arrival of EMS, the patient was poorly responsive only to  deep painful stimulus and had agonal respirations. She was given supplemental oxygen and started to improve during transport to the ED.   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 She denies alcohol use and states she last drank alcohol 1 year ago. She does report prior problems with alcohol. Patient reports marijuana use to sleep or for appetite several times per month. Urine drug screen in the ED was positive for amphetamines, benzodiazepines, cocaine and THC. The patient reports that she has been diagnosed with bipolar disorder in the past and states that she is prescribed Klonopin 0.5 mg 4 times daily, Seroquel 50 mg twice daily, gabapentin (unknown dose), risperidone (unknown dose) and clonidine which she is supposed to take as needed when she feels her blood pressure is elevated. She states that she is prescribed other medications but is uncertain as to what they are. She denies access to firearms.   Patient reports that she has a history of 1 prior inpatient psychiatric admission at age 22 after she took an overdose on pills in a suicide attempt. She states that this overdose at age18 was her only prior suicide attempt until the attempted hanging precipitating the current admission. The patient reports a history of nonsuicidal self-injurious behavior by cutting with onset at age 89. Patient states she cuts to release pain. She last cut 2 days prior to her recent suicide attempt.   On  interview today, the patient states that her mood is "a lot better."  She denies depressed mood.  She reports she is anxious to go home because she wants to spend time with her family and her son who is coming home from college.  She denies wishes for death and denies  suicidal ideation.  The patient reports that she slept well.  She expresses remorse over her suicide attempt and states she feels she let her family down and worries that she will of upset her children.  She denies AI, HI, AH, VH or PI.  Patient denies any symptoms of withdrawal from opioids or benzodiazepines.  She denies any physical problems.  She denies any medication side effects.  On exam the patient does not display any tremor, diaphoresis, rhinorrhea, lacrimation or piloerection.    PDMP review reveals that on 08/17/2020 patient filled a prescription for clonazepam 0.5 mg tablets quantity 60 for 15 days and that on 07/31/2020 she filled a Suboxone prescription 8 mg - 2 mg quantity 90 for 30 days.  Evaluation on the unit: patient was seen and evaluated today. She denies suicidal ideation, plan or intent. She denies HI/AVH, paranoia and delusions. She has responded well to her treatment regimen and is taking her medications without issues.  She agrees to continue taking her prescribed medications and to follow up with her outpatient providers after discharge. Patient is eating and sleeping well. Patient is attending group therapy and is working on Pharmacologistcoping skills. Patient is interacting well with staff and peers. Patient will return home and has means to pay for her medications. Her VS are stable, she is afebrile.  Patient is stable for discharge home today.    Principal Problem: Major depressive disorder, recurrent severe without psychotic features [facility](HCC) Discharge Diagnoses: Principal Problem:   Major depressive disorder, recurrent severe without psychotic features (HCC) Active Problems:   Polysubstance abuse (HCC)   Past Psychiatric History: See H&P  Past Medical History:  Past Medical History:  Diagnosis Date  . Polysubstance abuse (HCC) 08/27/2020   History reviewed. No pertinent surgical history. Family History: History reviewed. No pertinent family history. Family Psychiatric   History: See H&P Social History:  Social History   Substance and Sexual Activity  Alcohol Use None     Social History   Substance and Sexual Activity  Drug Use Not on file    Social History   Socioeconomic History  . Marital status: Married    Spouse name: Not on file  . Number of children: Not on file  . Years of education: Not on file  . Highest education level: Not on file  Occupational History  . Not on file  Tobacco Use  . Smoking status: Current Every Day Smoker    Packs/day: 0.25    Types: Cigarettes  . Smokeless tobacco: Never Used  Substance and Sexual Activity  . Alcohol use: Not on file  . Drug use: Not on file  . Sexual activity: Not on file  Other Topics Concern  . Not on file  Social History Narrative  . Not on file   Social Determinants of Health   Financial Resource Strain: Not on file  Food Insecurity: Not on file  Transportation Needs: Not on file  Physical Activity: Not on file  Stress: Not on file  Social Connections: Not on file    Hospital Course:  After the above admission evaluation, Alsha's presenting symptoms were noted. She was recommended for mood stabilization treatments. The medication regimen targeting  those presenting symptoms were discussed with her & initiated with her consent. Her  Her UDS on arrival to the ED was positive for amphetamines, benzodiazepines (prescribed per PDMP review), cocaine and THC. BAL was negative. Her benzodiazepines were decreased from 0.5 mg four times daily to 0.5 mg twice daily as needed. She has been strongly advised to discontinue this medication given her histroy of addiction and alcohol use disorder.   Her Suboxone was continued while in the hospital. She was continued on her nightly Seroquel 50 mg and Depakote DR 250 mg twice daily. She has responded well to the medication regimen and her mood has stabilized. She was medicated, stabilized & discharged on the medications as listed on her discharge  medication list below. Besides the mood stabilization treatments, Tishara was also enrolled & participated in the group counseling sessions being offered & held on this unit. She learned coping skills. She presented no other significant pre-existing medical issues that required treatment. She tolerated his treatment regimen without any adverse effects or reactions reported.   During the course of her hospitalization, the 15-minute checks were adequate to ensure patient's safety. Kinslei did not display any dangerous, violent or suicidal behavior on the unit.  She interacted with patients & staff appropriately, participated appropriately in the group sessions/therapies. Her medications were addressed & adjusted to meet her needs. She was recommended for outpatient follow-up care & medication management upon discharge to assure continuity of care & mood stability.  At the time of discharge patient is not reporting any acute suicidal/homicidal ideations. She feels more confident about her self-care & in managing his mental health. She currently denies any new issues or concerns. Education and supportive counseling provided throughout her hospital stay & upon discharge.   Today upon her discharge evaluation with the attending psychiatrist, Maebel shares she is doing well. She denies any other specific concerns. She is sleeping well. Her appetite is good. She denies other physical complaints. She denies AH/VH, delusional thoughts or paranoia. She does not appear to be responding to any internal stimuli. She feels that her medications have been helpful & is in agreement to continue her current treatment regimen as recommended. She was able to engage in safety planning including plan to return to [facility] or contact emergency services if she feels unable to maintain her own safety or the safety of others. Pt had no further questions, comments, or concerns. She left [facility] with all personal belongings in no apparent distress.  Transportation per private vehicle.   Physical Findings: AIMS: Facial and Oral Movements Muscles of Facial Expression: None, normal Lips and Perioral Area: None, normal Jaw: None, normal Tongue: None, normal,Extremity Movements Upper (arms, wrists, hands, fingers): None, normal Lower (legs, knees, ankles, toes): None, normal, Trunk Movements Neck, shoulders, hips: None, normal, Overall Severity Severity of abnormal movements (highest score from questions above): None, normal Incapacitation due to abnormal movements: None, normal Patient's awareness of abnormal movements (rate only patient's report): No Awareness, Dental Status Current problems with teeth and/or dentures?: Yes Does patient usually wear dentures?: No  CIWA:    COWS:     Musculoskeletal: Strength & Muscle Tone: within normal limits Gait & Station: normal Patient leans: N/A  Psychiatric Specialty Exam:  Presentation  General Appearance: Appropriate for Environment; Casual; Fairly Groomed  Eye Contact:Good  Speech:Clear and Coherent; Other (comment) (Verbose but not pressured)  Speech Volume:Normal  Handedness:Right  Mood and Affect  Mood:Euthymic  Affect:Congruent; Appropriate  Thought Process  Thought Processes:Coherent; Goal Directed  Descriptions of Associations:Circumstantial  Orientation:Full (Time, Place and Person)  Thought Content:Logical  History of Schizophrenia/Schizoaffective disorder:No  Duration of Psychotic Symptoms:No data recorded Hallucinations:No data recorded Ideas of Reference:None  Suicidal Thoughts:No data recorded Homicidal Thoughts:No data recorded  Sensorium  Memory:Immediate Fair; Recent Fair; Remote Fair  Judgment:Fair  Insight:Shallow; Fair  Art therapist  Concentration:Fair  Attention Span:Fair  Recall:Fair  [employer] of Knowledge:Fair  Language:Good  Psychomotor Activity  Psychomotor Activity:No data recorded  Assets  Assets:Communication  Skills; Desire for Improvement; Housing; Resilience; Social Support; Financial Resources/Insurance  Sleep  Sleep:No data recorded  Physical Exam: Physical Exam Vitals and nursing note reviewed.  Constitutional:      Appearance: Normal appearance.  HENT:     Head: Normocephalic.  Pulmonary:     Effort: Pulmonary effort is normal.  Musculoskeletal:        General: Normal range of motion.     Cervical back: Normal range of motion.  Neurological:     General: No focal deficit present.     Mental Status: She is alert and oriented to person, place, and time.  Psychiatric:        Attention and Perception: Attention and perception normal.        Mood and Affect: Mood normal.        Speech: Speech normal.        Behavior: Behavior normal. Behavior is cooperative.        Thought Content: Thought content normal.        Cognition and Memory: Cognition normal.    Review of Systems  Constitutional: Negative for fever.  HENT: Negative for congestion and sore throat.   Respiratory: Negative for cough and shortness of breath.   Cardiovascular: Negative for chest pain.  Gastrointestinal: Negative.   Genitourinary: Negative.   Musculoskeletal: Negative.   Neurological: Negative.    Blood pressure 103/63, pulse 86, temperature 98.5 F (36.9 C), temperature source Oral, resp. rate 16, height 5\' 7"  (1.702 m), weight 54 kg, SpO2 99 %. Body mass index is 18.64 kg/m.      Has this patient used any form of tobacco in the last 30 days? (Cigarettes, Smokeless Tobacco, Cigars, and/or Pipes) Yes, N/A  Blood Alcohol level:  Lab Results  Component Value Date   ETH <10 08/25/2020    Metabolic Disorder Labs:  Lab Results  Component Value Date   HGBA1C 5.2 08/28/2020   MPG 102.54 08/28/2020   No results found for: PROLACTIN Lab Results  Component Value Date   CHOL 122 08/28/2020   TRIG 124 08/28/2020   HDL 31 (L) 08/28/2020   CHOLHDL 3.9 08/28/2020   VLDL 25 08/28/2020   LDLCALC 66  08/28/2020    See Psychiatric Specialty Exam and Suicide Risk Assessment completed by Attending Physician prior to discharge.  Discharge destination:  Home  Is patient on multiple antipsychotic therapies at discharge:  No   Has Patient had three or more failed trials of antipsychotic monotherapy by history:  No  Recommended Plan for Multiple Antipsychotic Therapies: NA   Allergies as of 08/31/2020      Reactions   Vancomycin Rash   ALL MYCINS      Medication List    STOP taking these medications   baclofen 10 MG tablet Commonly known as: LIORESAL     TAKE these medications     Indication  clonazePAM 0.5 MG tablet Commonly known as: KLONOPIN Take 1 tablet (0.5 mg total) by mouth 2 (two) times daily as needed (anxiety). Use  your home medication. Do not take this medication unless absolutely necessary. It is recommended that yo utry to discontinue taking this altogether. What changed:   when to take this  reasons to take this  additional instructions  Indication: Feeling Anxious   divalproex 250 MG DR tablet Commonly known as: DEPAKOTE Take 1 tablet (250 mg total) by mouth 2 (two) times daily.  Indication: Manic-Depression   gabapentin 300 MG capsule Commonly known as: NEURONTIN Take 1 capsule (300 mg total) by mouth 3 (three) times daily.  Indication: Abuse or Misuse of Alcohol   QUEtiapine 50 MG tablet Commonly known as: SEROQUEL Take 1 tablet (50 mg total) by mouth at bedtime.  Indication: Agitation, Major Depressive Disorder   Suboxone 8-2 MG Film Generic drug: Buprenorphine HCl-Naloxone HCl Place 1 Film under the tongue 3 (three) times daily.  Indication: Opioid Dependence       Follow-up Information    [facility]. Go on 09/08/2020.   Specialty: Behavioral Health Why: You have an appointment to start SAIOP, substance abuse intensive outpatient therapy, and medication management services on 09/08/20 at 11:00 am.   This  appointment will be held in person. Contact information: [address] [place] [address] [phone]              Follow-up recommendations:  Activity:  as tolerated Diet:  heart healthy  Comments:  Prescriptions given at discharge.  Patient agreeable to plan.  Given opportunity to ask questions.  Appears to feel comfortable with discharge denies any current suicidal or homicidal thoughts.   Patient is instructed prior to discharge to: Take all medications as prescribed by her mental healthcare provider. Report any adverse effects and or reactions from the medicines to her outpatient provider promptly. Patient has been instructed & cautioned: To not engage in alcohol and or illegal drug use while on prescription medicines. In the event of worsening symptoms, patient is instructed to call the crisis hotline, 911 and or go to the nearest ED for appropriate evaluation and treatment of symptoms. To follow-up with her primary care provider for your other medical issues, concerns and or health care needs.  Signed: Laveda Abbe, NP 08/31/2020, 2:22 PM

## 2020-08-31 NOTE — Progress Notes (Signed)
Spiritual care group on grief and loss facilitated by chaplain Burnis Kingfisher MDiv, B[facility]  Group Goal:  Support / Education around grief and loss Members engage in facilitated group support and psycho-social education.  Group Description:  Following introductions and group rules, group members engaged in facilitated group dialog and support around topic of loss, with particular support around experiences of loss in their lives. Group Identified types of loss (relationships / self / things) and identified patterns, circumstances, and changes that precipitate losses. Reflected on thoughts / feelings around loss, normalized grief responses, and recognized variety in grief experience.   Group noted Worden's four tasks of grief in discussion.  Group drew on Adlerian / Rogerian, narrative, MI, Patient Progress:  Pt was present throughout group.  Asleep through group.  Awoke periodically and did not engage in group

## 2020-09-01 ENCOUNTER — Encounter (HOSPITAL_COMMUNITY): Payer: Self-pay | Admitting: Student

## 2020-09-08 ENCOUNTER — Ambulatory Visit (HOSPITAL_COMMUNITY): Payer: Medicaid Other | Admitting: Behavioral Health

## 2021-07-08 ENCOUNTER — Ambulatory Visit (HOSPITAL_COMMUNITY)
Admission: RE | Admit: 2021-07-08 | Discharge: 2021-07-08 | Disposition: A | Discharge status: Home / Self Care | Payer: Medicaid Other | Attending: Psychiatry | Admitting: Psychiatry
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
## 2021-07-12 ENCOUNTER — Encounter (HOSPITAL_COMMUNITY): Payer: Self-pay | Admitting: *Deleted

## 2021-07-12 ENCOUNTER — Emergency Department (HOSPITAL_COMMUNITY): Payer: Medicaid Other

## 2021-07-12 ENCOUNTER — Emergency Department (HOSPITAL_COMMUNITY)
Admission: EM | Admit: 2021-07-12 | Discharge: 2021-07-13 | Disposition: A | Discharge status: Home / Self Care | Payer: Medicaid Other | Attending: Emergency Medicine | Admitting: Emergency Medicine

## 2021-07-12 ENCOUNTER — Other Ambulatory Visit: Payer: Self-pay

## 2021-07-12 DIAGNOSIS — M25552 Pain in left hip: Secondary | ICD-10-CM | POA: Diagnosis not present

## 2021-07-12 DIAGNOSIS — F332 Major depressive disorder, recurrent severe without psychotic features: Secondary | ICD-10-CM | POA: Insufficient documentation

## 2021-07-12 DIAGNOSIS — M25512 Pain in left shoulder: Secondary | ICD-10-CM | POA: Insufficient documentation

## 2021-07-12 DIAGNOSIS — Z7982 Long term (current) use of aspirin: Secondary | ICD-10-CM | POA: Insufficient documentation

## 2021-07-12 DIAGNOSIS — Y900 Blood alcohol level of less than 20 mg/100 ml: Secondary | ICD-10-CM | POA: Insufficient documentation

## 2021-07-12 DIAGNOSIS — Z20822 Contact with and (suspected) exposure to covid-19: Secondary | ICD-10-CM | POA: Insufficient documentation

## 2021-07-12 DIAGNOSIS — E876 Hypokalemia: Secondary | ICD-10-CM | POA: Insufficient documentation

## 2021-07-12 DIAGNOSIS — R739 Hyperglycemia, unspecified: Secondary | ICD-10-CM | POA: Insufficient documentation

## 2021-07-12 DIAGNOSIS — D72829 Elevated white blood cell count, unspecified: Secondary | ICD-10-CM | POA: Diagnosis not present

## 2021-07-12 DIAGNOSIS — Z046 Encounter for general psychiatric examination, requested by authority: Secondary | ICD-10-CM | POA: Diagnosis present

## 2021-07-12 DIAGNOSIS — X838XXA Intentional self-harm by other specified means, initial encounter: Secondary | ICD-10-CM | POA: Insufficient documentation

## 2021-07-12 DIAGNOSIS — T1491XA Suicide attempt, initial encounter: Secondary | ICD-10-CM | POA: Diagnosis not present

## 2021-07-12 LAB — BASIC METABOLIC PANEL
Anion gap: 7 (ref 5–15)
BUN: 14 mg/dL (ref 6–20)
CO2: 31 mmol/L (ref 22–32)
Calcium: 8.9 mg/dL (ref 8.9–10.3)
Chloride: 102 mmol/L (ref 98–111)
Creatinine, Ser: 0.77 mg/dL (ref 0.44–1.00)
GFR, Estimated: >60 mL/min (ref >60)
Glucose, Bld: 110 mg/dL — ABNORMAL HIGH (ref 70–99)
Potassium: 3.8 mmol/L (ref 3.5–5.1)
Sodium: 140 mmol/L (ref 135–145)

## 2021-07-12 LAB — COMPREHENSIVE METABOLIC PANEL
ALT: 17 U/L (ref 0–44)
AST: 42 U/L — ABNORMAL HIGH (ref 15–41)
Albumin: 4.4 g/dL (ref 3.5–5.0)
Alkaline Phosphatase: 41 U/L (ref 38–126)
Anion gap: 9 (ref 5–15)
BUN: 15 mg/dL (ref 6–20)
CO2: 30 mmol/L (ref 22–32)
Calcium: 9.2 mg/dL (ref 8.9–10.3)
Chloride: 100 mmol/L (ref 98–111)
Creatinine, Ser: 0.92 mg/dL (ref 0.44–1.00)
GFR, Estimated: >60 mL/min (ref >60)
Glucose, Bld: 138 mg/dL — ABNORMAL HIGH (ref 70–99)
Potassium: 2.7 mmol/L — CL (ref 3.5–5.1)
Sodium: 139 mmol/L (ref 135–145)
Total Bilirubin: 0.7 mg/dL (ref 0.3–1.2)
Total Protein: 8.1 g/dL (ref 6.5–8.1)

## 2021-07-12 LAB — CBC WITH DIFFERENTIAL/PLATELET
Abs Immature Granulocytes: 0.04 10*3/uL (ref 0.00–0.07)
Basophils Absolute: 0.1 10*3/uL (ref 0.0–0.1)
Basophils Relative: 0 %
Eosinophils Absolute: 0.3 10*3/uL (ref 0.0–0.5)
Eosinophils Relative: 3 %
HCT: 42.9 % (ref 36.0–46.0)
Hemoglobin: 13.9 g/dL (ref 12.0–15.0)
Immature Granulocytes: 0 %
Lymphocytes Relative: 17 %
Lymphs Abs: 1.9 10*3/uL (ref 0.7–4.0)
MCH: 28.8 pg (ref 26.0–34.0)
MCHC: 32.4 g/dL (ref 30.0–36.0)
MCV: 88.8 fL (ref 80.0–100.0)
Monocytes Absolute: 0.8 10*3/uL (ref 0.1–1.0)
Monocytes Relative: 7 %
Neutro Abs: 8.4 10*3/uL — ABNORMAL HIGH (ref 1.7–7.7)
Neutrophils Relative %: 73 %
Platelets: 294 10*3/uL (ref 150–400)
RBC: 4.83 MIL/uL (ref 3.87–5.11)
RDW: 12.6 % (ref 11.5–15.5)
WBC: 11.4 10*3/uL — ABNORMAL HIGH (ref 4.0–10.5)
nRBC: 0 % (ref 0.0–0.2)

## 2021-07-12 LAB — RAPID URINE DRUG SCREEN, HOSP PERFORMED
Amphetamines: POSITIVE — AB
Barbiturates: NOT DETECTED
Benzodiazepines: POSITIVE — AB
Cocaine: NOT DETECTED
Opiates: NOT DETECTED
Tetrahydrocannabinol: POSITIVE — AB

## 2021-07-12 LAB — ETHANOL: Alcohol, Ethyl (B): <10 mg/dL

## 2021-07-12 LAB — SALICYLATE LEVEL: Salicylate Lvl: <7 mg/dL — ABNORMAL LOW (ref 7.0–30.0)

## 2021-07-12 LAB — MAGNESIUM: Magnesium: 2.2 mg/dL (ref 1.7–2.4)

## 2021-07-12 LAB — RESP PANEL BY RT-PCR (FLU A&B, COVID) ARPGX2
Influenza A by PCR: NEGATIVE
Influenza B by PCR: NEGATIVE
SARS Coronavirus 2 by RT PCR: NEGATIVE

## 2021-07-12 LAB — I-STAT BETA HCG BLOOD, ED (MC, WL, AP ONLY): I-stat hCG, quantitative: <5 m[IU]/mL

## 2021-07-12 MED ORDER — SUMATRIPTAN SUCCINATE 100 MG PO TABS: 100.0000 mg | ORAL_TABLET | ORAL | Status: DC | PRN

## 2021-07-12 MED ORDER — POTASSIUM CHLORIDE 20 MEQ PO PACK
40.0000 meq | PACK | Freq: Once | ORAL | Status: AC
  Administered 2021-07-12: 40 meq via ORAL
  Filled 2021-07-12: qty 2

## 2021-07-12 MED ORDER — ZOLPIDEM TARTRATE 5 MG PO TABS: 5.0000 mg | ORAL_TABLET | Freq: Every evening | ORAL | Status: DC | PRN

## 2021-07-12 MED ORDER — CLONAZEPAM 0.5 MG PO TABS
0.5000 mg | ORAL_TABLET | Freq: Three times a day (TID) | ORAL | Status: DC
  Administered 2021-07-12 – 2021-07-13 (×2): 0.5 mg via ORAL
  Filled 2021-07-12 (×2): qty 1

## 2021-07-12 MED ORDER — POTASSIUM CHLORIDE CRYS ER 20 MEQ PO TBCR: 40.0000 meq | EXTENDED_RELEASE_TABLET | Freq: Once | ORAL | Status: DC

## 2021-07-12 MED ORDER — ONDANSETRON HCL 4 MG PO TABS: 4.0000 mg | ORAL_TABLET | Freq: Three times a day (TID) | ORAL | Status: DC | PRN

## 2021-07-12 MED ORDER — FAMOTIDINE 20 MG PO TABS
40.0000 mg | ORAL_TABLET | Freq: Every day | ORAL | Status: DC
Start: 2021-07-12 — End: 2021-07-13
  Administered 2021-07-12 – 2021-07-13 (×2): 40 mg via ORAL
  Filled 2021-07-12 (×2): qty 2

## 2021-07-12 MED ORDER — ASPIRIN EC 325 MG PO TBEC
325.0000 mg | DELAYED_RELEASE_TABLET | Freq: Every day | ORAL | Status: DC
  Administered 2021-07-12 – 2021-07-13 (×2): 325 mg via ORAL
  Filled 2021-07-12 (×2): qty 1

## 2021-07-12 MED ORDER — GABAPENTIN 300 MG PO CAPS
300.0000 mg | ORAL_CAPSULE | Freq: Three times a day (TID) | ORAL | Status: DC
  Administered 2021-07-12 – 2021-07-13 (×2): 300 mg via ORAL
  Filled 2021-07-12 (×2): qty 1

## 2021-07-12 MED ORDER — ACETAMINOPHEN 325 MG PO TABS: 650.0000 mg | ORAL_TABLET | ORAL | Status: DC | PRN

## 2021-07-12 MED ORDER — BUPRENORPHINE HCL-NALOXONE HCL 8-2 MG SL SUBL
1.0000 | SUBLINGUAL_TABLET | Freq: Every day | SUBLINGUAL | Status: DC
  Administered 2021-07-12 – 2021-07-13 (×2): 1 via SUBLINGUAL
  Filled 2021-07-12 (×2): qty 1

## 2021-07-12 MED ORDER — QUETIAPINE FUMARATE 50 MG PO TABS
50.0000 mg | ORAL_TABLET | Freq: Every day | ORAL | Status: DC
  Administered 2021-07-12: 50 mg via ORAL
  Filled 2021-07-12 (×2): qty 1

## 2021-07-12 MED ORDER — ALUM & MAG HYDROXIDE-SIMETH 200-200-20 MG/5ML PO SUSP: 30.0000 mL | Freq: Four times a day (QID) | ORAL | Status: DC | PRN

## 2021-07-12 MED ORDER — HALOPERIDOL 5 MG PO TABS: 5.0000 mg | ORAL_TABLET | Freq: Four times a day (QID) | ORAL | Status: DC | PRN

## 2021-07-12 MED ORDER — BENZTROPINE MESYLATE 1 MG PO TABS
1.0000 mg | ORAL_TABLET | Freq: Four times a day (QID) | ORAL | Status: DC | PRN
  Filled 2021-07-12: qty 1

## 2021-07-12 NOTE — ED Notes (Signed)
Dr. Rhunette Croft informed of patients potassium of 2.7 via epic secure chat ?

## 2021-07-12 NOTE — ED Notes (Signed)
Dr. Lala Lund informed of BP 82/62 MAP 70. She states it is okay to give her the ordered meds: pepcid, suboxone, klonopin, aspirin and gabapentin ?
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
## 2021-07-12 NOTE — ED Provider Notes (Signed)
?[facility] EMERGENCY DEPARTMENT ?Provider Note ? ? ?CSN: [id number] ?Arrival date & time: 07/12/21  1202 ? ?  ? ?History ?Chief Complaint  ?Patient presents with  ? Suicidal  ? ? ?[name] is a 41 y.o. female presenting after suicide attempt yesterday.  Patient took a single opted against a shower pole.  She reports she is grateful it was not strong enough to hold her.  She is not actively suicidal right now.  She does report yesterday she wanted to die.  She overheard a conversation with family members talking for about her which triggered her to feel this way.  She is working with her psychiatrist due to intermittently hearing voices.  She said the voices are people she knows.  Last time she wore them was yesterday but they did not tell her to do anything.  She denies seeing things other people cannot see.  She reports she is post to see a neuropsychiatrist but has not had the referral done.  She reports some left shoulder and left hip pain after falling after the shower curtain would not hold her yesterday.  She denies any headache, fevers, chills, abdominal pain, chest pain, back pain. ? ?HPI ? ?  ? ?Home Medications ?Prior to Admission medications   ?Medication Sig Start Date End Date Taking? Authorizing Provider  ?aspirin EC 325 MG EC tablet Take 1 tablet (325 mg total) by mouth daily. 11/16/18   West Bali, PA-C  ?Cholecalciferol (D3 HIGH POTENCY) 125 MCG (5000 UT) capsule Take 1 capsule (5,000 Units total) by mouth daily. 11/16/18   West Bali, PA-C  ?clonazePAM (KLONOPIN) 0.5 MG tablet Take 1 tablet (0.5 mg total) by mouth 2 (two) times daily as needed (anxiety). Use your home medication. Do not take this medication unless absolutely necessary. It is recommended that yo utry to discontinue taking this altogether. 08/31/20   Laveda Abbe, NP  ?divalproex (DEPAKOTE SPRINKLE) 125 MG capsule Take 2 capsules (250 mg total) by mouth every 12 (twelve) hours. 09/15/17    Angiulli, Mcarthur Rossetti, PA-C  ?divalproex (DEPAKOTE) 250 MG DR tablet Take 1 tablet (250 mg total) by mouth 2 (two) times daily. 08/31/20   Laveda Abbe, NP  ?gabapentin (NEURONTIN) 300 MG capsule Take 1 capsule (300 mg total) by mouth 3 (three) times daily. 08/31/20   Laveda Abbe, NP  ?gabapentin (NEURONTIN) 400 MG capsule Take 400 mg by mouth 3 (three) times daily.    [provider]  ?methocarbamol (ROBAXIN) 750 MG tablet Take 1 tablet (750 mg total) by mouth every 6 (six) hours as needed for muscle spasms. 11/16/18   West Bali, PA-C  ?pantoprazole (PROTONIX) 40 MG tablet Take 1 tablet (40 mg total) by mouth 2 (two) times daily. 09/15/17   Angiulli, Mcarthur Rossetti, PA-C  ?QUEtiapine (SEROQUEL) 50 MG tablet Take 1 tablet (50 mg total) by mouth at bedtime. 08/31/20   Laveda Abbe, NP  ?SUBOXONE 8-2 MG FILM Place 1 Film under the tongue 3 (three) times daily. 07/31/20   [provider]  ?Vitamin D, Ergocalciferol, (DRISDOL) 1.25 MG (50000 UT) CAPS capsule Take 50,000 Units by mouth every 7 (seven) days. Sunday    [provider]  ?zolpidem (AMBIEN) 5 MG tablet Take 5 mg by mouth at bedtime as needed for sleep.    [provider]  ?   ? ?Allergies    ?Augmentin [amoxicillin-pot clavulanate], Vancomycin, Azithromycin, Erythromycin, Sudafed [pseudoephedrine hcl], Tramadol, Vancomycin, and Zithromax [azithromycin]   ? ?  Review of Systems   ?Review of Systems  ?HENT:  Negative for congestion and sore throat.   ?Respiratory:  Negative for cough and shortness of breath.   ?Musculoskeletal:  Positive for arthralgias and myalgias. Negative for back pain and neck pain.  ?Psychiatric/Behavioral:  Positive for suicidal ideas. The patient is nervous/anxious.   ? ?Physical Exam ?Updated Vital Signs ?BP 103/82   Pulse (!) 134   Temp 97.8 ?F (36.6 ?C) (Oral)   Resp 16   LMP 07/12/2021   SpO2 97%  ?Physical Exam ? ?ED Results / Procedures / Treatments   ?Labs ?(all labs ordered  are listed, but only abnormal results are displayed) ?Labs Reviewed  ?CBC WITH DIFFERENTIAL/PLATELET - Abnormal; Notable for the following components:  ?    Result Value  ? WBC 11.4 (*)   ? Neutro Abs 8.4 (*)   ? All other components within normal limits  ?SALICYLATE LEVEL - Abnormal; Notable for the following components:  ? Salicylate Lvl <7.0 (*)   ? All other components within normal limits  ?RESP PANEL BY RT-PCR (FLU A&B, COVID) ARPGX2  ?ETHANOL  ?COMPREHENSIVE METABOLIC PANEL  ?RAPID URINE DRUG SCREEN, HOSP PERFORMED  ?I-STAT BETA HCG BLOOD, ED ([facility], [facility], [facility] ONLY)  ? ? ?EKG ?EKG Interpretation ? ?Date/Time:  Monday July 12 2021 15:16:59 EDT ?Ventricular Rate:  111 ?PR Interval:  98 ?QRS Duration: 84 ?QT Interval:  464 ?QTC Calculation: 631 ?R Axis:   88 ?Text Interpretation: with short PR ST & T wave abnormality, consider inferior ischemia Prolonged QT - Abnormal ECG No previous ECGs available repeat ordered Confirmed by Derwood KaplanNanavati, Ankit [id number]) on 07/12/2021 3:24:43 PM ? ?Radiology ?DG Shoulder 1 View Left ? ?Result Date: 07/12/2021 ?CLINICAL DATA:  Left shoulder pain EXAM: LEFT SHOULDER COMPARISON:  None. FINDINGS: Single frontal view of the shoulder submitted. There is no evidence of fracture or dislocation. Plate and screw fixation of the mid clavicle. There is no evidence of arthropathy or other focal bone abnormality. Soft tissues are unremarkable. IMPRESSION: No acute finding in the left shoulder on single-view exam. Electronically Signed   By: Emmaline KluverNancy  Ballantyne M.D.   On: 07/12/2021 16:16  ? ?DG Hip Unilat W or Wo Pelvis 2-3 Views Left ? ?Result Date: 07/12/2021 ?CLINICAL DATA:  Left hip pain. EXAM: DG HIP (WITH OR WITHOUT PELVIS) 2-3V LEFT COMPARISON:  Pelvis x-ray 09/01/2017. FINDINGS: There are healed right superior and inferior pubic rami fractures. Orthopedic screws are seen along the medial aspects of the acetabula bilaterally as well as left hip screws and intramedullary nail present. No evidence for  hardware loosening. No acute fracture. Healed proximal femoral fracture. Alignment is anatomic. Joint spaces are maintained. IMPRESSION: 1. No acute bony abnormality. 2. Left hip screw and intramedullary nail appear uncomplicated. 3. Healed left femoral and right pubic rami fractures. Electronically Signed   By: Darliss CheneyAmy  Guttmann M.D.   On: 07/12/2021 16:26   ? ?Procedures ?Procedures  ? ?Medications Ordered in ED ?Medications  ?QUEtiapine (SEROQUEL) tablet 50 mg (has no administration in time range)  ?zolpidem (AMBIEN) tablet 5 mg (has no administration in time range)  ?acetaminophen (TYLENOL) tablet 650 mg (has no administration in time range)  ?ondansetron (ZOFRAN) tablet 4 mg (has no administration in time range)  ?alum & mag hydroxide-simeth (MAALOX/MYLANTA) 200-200-20 MG/5ML suspension 30 mL (has no administration in time range)  ?haloperidol (HALDOL) tablet 5 mg (has no administration in time range)  ?  And  ?benztropine (COGENTIN) tablet 1 mg (has no administration  in time range)  ?potassium chloride SA (KLOR-CON M) CR tablet 40 mEq (has no administration in time range)  ?potassium chloride (KLOR-CON) packet 40 mEq (40 mEq Oral Given 07/12/21 1641)  ? ? ?ED Course/ Medical Decision Making/ A&P ?  ?                        ?Medical Decision Making ?Amount and/or Complexity of Data Reviewed ?Labs: ordered. ?Radiology: ordered. ? ?Risk ?OTC drugs. ?Prescription drug management. ? ? ?41 year old female presenting for suicide attempt yesterday.  Not actively suicidal at this time.  Patient complaining of left hip and left shoulder pain.  Plain films obtained in the areas on my review are negative for acute fractures which is confirmed by radiology. ? ?Labs remarkable for hypokalemia with a potassium of 2.7.  Glucose is 138, AST of 42.  Mild leukocytosis of 11.4 with neutrophil predominance.  Salicylate level unremarkable.  Patient is not pregnant.  Skin within normal limits.  EKG with possible prolonged QT interval.   Will obtain repeat.  Home medications have not been updated in approximately 6 months.  Patient is thought to take Seroquel nightly and Ambien which are ordered at this time.  Medical rec still pending and these o

## 2021-07-12 NOTE — ED Notes (Signed)
Pt aware we need urine sample stated she will try after she drink her sprite.  ?

## 2021-07-13 DIAGNOSIS — T1491XA Suicide attempt, initial encounter: Secondary | ICD-10-CM

## 2021-07-13 NOTE — ED Notes (Signed)
Pt verbalized understanding of DC instructions. Provided pt with paper scrubs and personal belongings. Pt ambulated to lobby.  ?

## 2021-07-13 NOTE — ED Notes (Signed)
Pt at orange nurses station making first phone call of the day ?

## 2021-07-13 NOTE — Consult Note (Addendum)
?  [name] is a 41 year old Caucasian female.  She was seen and evaluated face-to-face by T. Melvyn Neth and attending psychiatrist Dr. Lucianne Muss.  She is denying suicidal or homicidal ideations.  Denies auditory visual hallucinations.  States she got frustrated due to possible cheating incident between she and her boyfriend and ex-girlfriend  while they where at a local hotel.   ? ?She reports she is currently residing with her mother.  Denied that she has been taking medications as indicated.  Patient was offered to follow-up with partial hospitalization programming.  UDS + benzodiazepines, amphetamines and marijuana. ? ?She was receptive to plan.  Care coordinator manager to provide additional outpatient resources for [facility] and/or chemical dependency programming.  Patient was receptive to plan.  Support, encouragement and  reassurance was provided. ?

## 2021-07-13 NOTE — BH Assessment (Signed)
Comprehensive Clinical Assessment (CCA) Note ? ?07/13/2021 ?Loretta ElliotKacie Shewell ?[id number] ? ?Discharge Disposition: ?Melbourne Abtsody Taylor, PA-C, reviewed pt's chart and information and determined pt meets inpatient criteria. Pt's referral information will be faxed out to multiple hospitals[facility], for [facility]. This information was relayed to pt's team at 0459. ? ?The patient demonstrates the following risk factors for suicide: Chronic risk factors for suicide include: psychiatric disorder of MDD, substance use disorder, and previous suicide attempts most recently 2 days ago . Acute risk factors for suicide include: unemployment and social withdrawal/isolation. Protective factors for this patient include: positive social support, responsibility to others (children, family), and hope for the future. Considering these factors, the overall suicide risk at this point appears to be high. Patient is not appropriate for outpatient follow up. ? ?Therefore, a 1:1 sitter is recommended at this time. ? ?[facility]) [facility]  ?C-SSRS RISK CATEGORY High Risk High Risk High Risk  ? ?  ?Chief Complaint:  ?Chief Complaint  ?Patient presents with  ? Suicidal  ? Depression  ? Hallucinations  ? Addiction Problem  ? ?Visit Diagnosis: MDD, Recurrent, Severe  ? ?CCA Screening, Triage and Referral (STR) ?Loretta ElliotKacie Townsend is a 41 year old patient [facility] to a suicide attempt yesterday. Pt shares she overheard people talking negatively about her at the motel she and her husband were at (to spend time to themselves since they live with her parents) and she states she overheard people talking about her. She states that, due to this, she attempted to hang herself in the bathroom, though she states this  was a half-hearted attempt, as the shower curtain rod was plastic; she states that this was more of a "cry for help." She states, "Now I'm depressed but I'm so angry wtih myself."  ? ?Pt denies current SI. She shares she previously attempted to kill herself in May 2022 by hanging. Pt denies she has a plan to kill herself at this time. Pt denies HI, VH, NSSIB (hx of incidents when she was a teenager), engagement with the legal system, and access to guns/weapons.  ? ?Pt endorses experiencing AH and states she used methamphetamine several days ago. Pt states she has spurts of using methamphetamine for 5-6 days and other times where she won't use it for a month or two. She shares she has been on Suboxone for 1 year and states she has been clean from the use of opioids for 16 months. Pt's UDA was also positive for THC and benzos. ? ?Pt is oriented x5. Her recent/remote memory is intact. Pt was cooperative, though restless, throughout the assessment process. Pt's insight, judgement, and impulse control is poor at this time. ? ?Patient Reported Information ?How did you hear about us? Self ? ?What Is the Reason for Your Visit/Call Today? Pt shares she overheard people talking negatively about her at the motel she and her husband are at (to spend time to themselves) and she states she overheard people talking about her. She states that, due to this, she attempted to hang herself in the bathroom, though she states this was a half-hearted attempt, as the shower curtain rod was plastic; she states that this was more of a "cry for help." She states, "Now I'm depressed but I'm so angry wtih myself." Pt denies current SI. She  shares she previously attempted to kill herself in May 2022 by hanging. Pt denies she has a plan to kill herself at this time. Pt denies HI, VH, NSSIB (hx of incidents when she was a teenager), engagement with the legal system, and access to guns/weapons. Pt endorses experiencing AH and states she used  methamphetamine several days ago. Pt states she has spurts of using methamphetamine for 5-6 days and other times where she won't use it for a month or two. She shares she has been on Suboxone for 1 year and states she has been clean from the use of opioids for 16 months. ? ?How Long Has This Been Causing You Problems? > than 6 months ? ?What Do You Feel Would Help You the Most Today? Alcohol or Drug Use Treatment; Treatment for Depression or other mood problem; Medication(s) ? ? ?Have You Recently Had Any Thoughts About Hurting Yourself? Yes ? ?Are You Planning to Commit Suicide/Harm Yourself At This time? No ? ? ?Have you Recently Had Thoughts About Hurting Someone Karolee Ohs? No ? ?Are You Planning to Harm Someone at This Time? No ? ?Explanation: No data recorded ? ?Have You Used Any Alcohol or Drugs in the Past 24 Hours? No ? ?How Long Ago Did You Use Drugs or Alcohol? No data recorded ?What Did You Use and How Much? No data recorded ? ?Do You Currently Have a Therapist/Psychiatrist? No ? ?Name of Therapist/Psychiatrist: No data recorded ? ?Have You Been Recently Discharged From Any Office Practice or Programs? No ? ?Explanation of Discharge From Practice/Program: No data recorded ? ?  ?CCA Screening Triage Referral Assessment ?Type of Contact: Tele-Assessment ? ?Telemedicine Service Delivery: Telemedicine service delivery: This service was provided via telemedicine using a 2-way, interactive audio and video technology ? ?Is this Initial or Reassessment? Initial Assessment ? ?Date Telepsych consult ordered in [facility]:  07/12/21 ? ?Time Telepsych consult ordered in [facility]:  1850 ? ?Location of Assessment: [facility] ED ? ?Provider Location: [facility] Assessment Services ? ? ?Collateral Involvement: None currently ? ? ?Does Patient Have a Automotive engineer Guardian? No data recorded ?Name and Contact of Legal Guardian: No data recorded ?If Minor and Not Living with Parent(s), Who has Custody? N/A ? ?Is CPS involved or ever been  involved? Never ? ?Is APS involved or ever been involved? Never ? ? ?Patient Determined To Be At Risk for Harm To Self or Others Based on Review of Patient Reported Information or Presenting Complaint? Yes, for Self-Harm ? ?Method: No data recorded ?Availability of Means: No data recorded ?Intent: No data recorded ?Notification Required: No data recorded ?Additional Information for Danger to Others Potential: No data recorded ?Additional Comments for Danger to Others Potential: No data recorded ?Are There Guns or Other Weapons in Your Home? No data recorded ?Types of Guns/Weapons: No data recorded ?Are These Weapons Safely Secured?                            No data recorded ?Who Could Verify You Are Able To Have These Secured: No data recorded ?Do You Have any Outstanding Charges, Pending Court Dates, Parole/Probation? No data recorded ?Contacted To Inform of Risk of Harm To Self or Others: Family/Significant Other: (Pt's husband is aware) ? ? ? ?Does Patient Present under Involuntary Commitment? No ? ?IVC Papers Initial File Date: No data recorded ? ?[place] of Residence: [place] ? ? ?Patient Currently Receiving the Following Services: Medication Management ? ? ?Determination  of Need: Emergent (2 hours) ? ? ?Options For Referral: Medication Management; Outpatient Therapy; Inpatient Hospitalization ? ? ? ? ?CCA Biopsychosocial ?Patient Reported Schizophrenia/Schizoaffective Diagnosis in Past: Yes ? ? ?Strengths: Pt shares she cares about her children a great deal. Pt wants to make better choices for her family. ? ? ?Mental Health Symptoms ?Depression:   ?Tearfulness; Change in energy/activity; Irritability ?  ?Duration of Depressive symptoms:  ?Duration of Depressive Symptoms: Greater than two weeks ?  ?Mania:   ?Racing thoughts ?  ?Anxiety:    ?Worrying; Tension ?  ?Psychosis:   ?Hallucinations ?  ?Duration of Psychotic symptoms:  ?Duration of Psychotic Symptoms: Greater than six months ?  ?Trauma:   ?None ?   ?Obsessions:   ?None ?  ?Compulsions:   ?None ?  ?Inattention:   ?None ?  ?Hyperactivity/Impulsivity:   ?N/A ?  ?Oppositional/Defiant Behaviors:   ?None ?  ?Emotional Irregularity:   ?Potentially harmful impulsivity;

## 2021-07-13 NOTE — ED Notes (Signed)
Placed breakfast order ?

## 2021-07-13 NOTE — BH Assessment (Signed)
[facility] Assessment Progress Note ?  ?Per Nelly Rout, MD, this pt does not require psychiatric hospitalization at this time.  Pt is psychiatrically cleared.  Discharge instructions include referral information for the [facility] for psychiatry, therapy and SA-IOP.  Pt's nurses, Morrie Sheldon and Victorino Dike, have been notified. ? ?Doylene Canning, MA ?Triage Specialist ?[phone] ? ?

## 2021-07-13 NOTE — ED Notes (Signed)
Pt given snack to hold her over until lunch as she stated she was hungry ?

## 2021-07-13 NOTE — Discharge Instructions (Addendum)
To help you maintain a sober lifestyle, a substance abuse treatment program may be beneficial to you.  Contact the [facility] at your earliest opportunity.  They offer psychiatry, therapy and Substance Abuse Intensive Outpatient Programming: ? ?     [facility] ?     [address] ?     [place], [place] [address] ?     [phone]  ?

## 2021-07-13 NOTE — ED Notes (Signed)
Pt provided with clean scrubs, sanitary pad, underwear, and full linen change completed.  ?

## 2021-07-13 NOTE — ED Notes (Signed)
Pt given paper scrub pants to change in after she eats her lunch. Husband called to pick her up as she's been discharged. ?

## 2021-07-13 NOTE — ED Provider Notes (Signed)
Emergency Medicine Observation Re-evaluation Note ? ?[name] is a 41 y.o. female, seen on rounds today.  Pt initially presented to the ED for complaints of Suicidal, Depression, Hallucinations, and Addiction Problem ?Currently, the patient is sleeping. ? ?Physical Exam  ?BP 93/68   Pulse 85   Temp 97.8 ?F (36.6 ?C) (Oral)   Resp 15   LMP 07/12/2021   SpO2 100%  ?Physical Exam ?General: Sleeping, nondistressed ?Cardiac: Extremities well perfused ?Lungs: Breathing is unlabored ?Psych: Deferred ? ?ED Course / MDM  ?EKG:EKG Interpretation ? ?Date/Time:  Monday July 12 2021 15:16:59 EDT ?Ventricular Rate:  111 ?PR Interval:  98 ?QRS Duration: 84 ?QT Interval:  464 ?QTC Calculation: 631 ?R Axis:   88 ?Text Interpretation: with short PR ST & T wave abnormality, consider inferior ischemia Prolonged QT - Abnormal ECG No previous ECGs available repeat ordered Confirmed by Varney Biles [phone]) on 07/12/2021 3:24:43 PM ? ?I have reviewed the labs performed to date as well as medications administered while in observation.  Recent changes in the last 24 hours include this patient presented to the ED yesterday after a reported suicide attempt by hanging.  Initial lab work was notable for hypokalemia.  Potassium has been replaced and potassium normalized on repeat BMP.  She is medically cleared.  She was evaluated by psychiatry this morning.  She does meet inpatient criteria. ? ?Plan  ?Current plan is for inpatient psychiatric admission. ? [name] is not under involuntary commitment. ? ? ?  ?Godfrey Pick, MD ?07/13/21 [phone] ? ?

## 2021-07-15 ENCOUNTER — Other Ambulatory Visit: Payer: Self-pay

## 2021-07-15 ENCOUNTER — Emergency Department (HOSPITAL_BASED_OUTPATIENT_CLINIC_OR_DEPARTMENT_OTHER): Payer: Medicaid Other

## 2021-07-15 ENCOUNTER — Emergency Department (HOSPITAL_BASED_OUTPATIENT_CLINIC_OR_DEPARTMENT_OTHER)
Admission: EM | Admit: 2021-07-15 | Discharge: 2021-07-15 | Disposition: A | Discharge status: Home / Self Care | Payer: Medicaid Other | Attending: Emergency Medicine | Admitting: Emergency Medicine

## 2021-07-15 ENCOUNTER — Encounter (HOSPITAL_BASED_OUTPATIENT_CLINIC_OR_DEPARTMENT_OTHER): Payer: Self-pay | Admitting: Obstetrics and Gynecology

## 2021-07-15 DIAGNOSIS — R109 Unspecified abdominal pain: Secondary | ICD-10-CM | POA: Diagnosis present

## 2021-07-15 DIAGNOSIS — Z7982 Long term (current) use of aspirin: Secondary | ICD-10-CM | POA: Diagnosis not present

## 2021-07-15 DIAGNOSIS — R112 Nausea with vomiting, unspecified: Secondary | ICD-10-CM | POA: Insufficient documentation

## 2021-07-15 LAB — COMPREHENSIVE METABOLIC PANEL
ALT: 19 U/L (ref 0–44)
AST: 27 U/L (ref 15–41)
Albumin: 3.4 g/dL — ABNORMAL LOW (ref 3.5–5.0)
Alkaline Phosphatase: 34 U/L — ABNORMAL LOW (ref 38–126)
Anion gap: 6 (ref 5–15)
BUN: 24 mg/dL — ABNORMAL HIGH (ref 6–20)
CO2: 28 mmol/L (ref 22–32)
Calcium: 7.8 mg/dL — ABNORMAL LOW (ref 8.9–10.3)
Chloride: 107 mmol/L (ref 98–111)
Creatinine, Ser: 0.66 mg/dL (ref 0.44–1.00)
GFR, Estimated: >60 mL/min (ref >60)
Glucose, Bld: 91 mg/dL (ref 70–99)
Potassium: 3.6 mmol/L (ref 3.5–5.1)
Sodium: 141 mmol/L (ref 135–145)
Total Bilirubin: 0.3 mg/dL (ref 0.3–1.2)
Total Protein: 6.4 g/dL — ABNORMAL LOW (ref 6.5–8.1)

## 2021-07-15 LAB — URINALYSIS, ROUTINE W REFLEX MICROSCOPIC
Bilirubin Urine: NEGATIVE
Glucose, UA: NEGATIVE mg/dL
Ketones, ur: NEGATIVE mg/dL
Leukocytes,Ua: NEGATIVE
Nitrite: NEGATIVE
Protein, ur: 30 mg/dL — AB
Specific Gravity, Urine: >1.046 — ABNORMAL HIGH (ref 1.005–1.030)
pH: 6 (ref 5.0–8.0)

## 2021-07-15 LAB — CBC
HCT: 36.8 % (ref 36.0–46.0)
Hemoglobin: 11.6 g/dL — ABNORMAL LOW (ref 12.0–15.0)
MCH: 28.4 pg (ref 26.0–34.0)
MCHC: 31.5 g/dL (ref 30.0–36.0)
MCV: 90 fL (ref 80.0–100.0)
Platelets: 222 10*3/uL (ref 150–400)
RBC: 4.09 MIL/uL (ref 3.87–5.11)
RDW: 12.5 % (ref 11.5–15.5)
WBC: 8.8 10*3/uL (ref 4.0–10.5)
nRBC: 0 % (ref 0.0–0.2)

## 2021-07-15 LAB — PREGNANCY, URINE: Preg Test, Ur: NEGATIVE

## 2021-07-15 LAB — LIPASE, BLOOD: Lipase: <10 U/L — ABNORMAL LOW (ref 11–51)

## 2021-07-15 MED ORDER — SODIUM CHLORIDE 0.9 % IV BOLUS
1000.0000 mL | Freq: Once | INTRAVENOUS | Status: AC
  Administered 2021-07-15: 1000 mL via INTRAVENOUS

## 2021-07-15 MED ORDER — ONDANSETRON HCL 4 MG/2ML IJ SOLN
4.0000 mg | Freq: Once | INTRAMUSCULAR | Status: AC
  Administered 2021-07-15: 4 mg via INTRAVENOUS
  Filled 2021-07-15: qty 2

## 2021-07-15 MED ORDER — IOHEXOL 300 MG/ML  SOLN
100.0000 mL | Freq: Once | INTRAMUSCULAR | Status: AC | PRN
  Administered 2021-07-15: 75 mL via INTRAVENOUS

## 2021-07-15 NOTE — ED Triage Notes (Signed)
Patient reports to the ER for abdominal pain. Patient was recently started Suboxone and is going through the treatment with that process. Patient is having nausea and emesis since 0300 this morning.  ?

## 2021-07-15 NOTE — ED Provider Notes (Signed)
?[facility] ?Provider Note ? ? ?CSN: [id number] ?Arrival date & time: 07/15/21  1001 ? ?  ? ?History ? ?Chief Complaint  ?Patient presents with  ? Abdominal Pain  ? ? ?Loretta ElliotKacie Smoker is a 41 y.o. female. ? ?HPI ? ?41 year old female with past medical history of anxiety/depression, tubal ligation, chronic pain currently on suboxone presents with right sided abdominal pain. Patient states that this started this morning, associated with n/v. No diarrhea. No genitourinary symptoms. She is currently on her menstrual cycle, denies being sexually active or any possibility of pregnancy.  Patient denies any fever, chills.  States she has been taking her Suboxone as prescribed. ? ?Home Medications ?Prior to Admission medications   ?Medication Sig Start Date End Date Taking? Authorizing Provider  ?aspirin EC 325 MG EC tablet Take 1 tablet (325 mg total) by mouth daily. ?Patient not taking: Reported on 07/12/2021 11/16/18   West BaliMcClung, Sarah A, PA-C  ?Cholecalciferol (D3 HIGH POTENCY) 125 MCG (5000 UT) capsule Take 1 capsule (5,000 Units total) by mouth daily. ?Patient not taking: Reported on 07/12/2021 11/16/18   West BaliMcClung, Sarah A, PA-C  ?clonazePAM (KLONOPIN) 0.5 MG tablet Take 1 tablet (0.5 mg total) by mouth 2 (two) times daily as needed (anxiety). Use your home medication. Do not take this medication unless absolutely necessary. It is recommended that yo utry to discontinue taking this altogether. ?Patient taking differently: Take 0.5 mg by mouth 3 (three) times daily. 08/31/20   Laveda AbbeParks, Laurie Britton, NP  ?divalproex (DEPAKOTE SPRINKLE) 125 MG capsule Take 2 capsules (250 mg total) by mouth every 12 (twelve) hours. ?Patient not taking: Reported on 07/12/2021 09/15/17   Charlton AmorAngiulli, Daniel J, PA-C  ?divalproex (DEPAKOTE) 250 MG DR tablet Take 1 tablet (250 mg total) by mouth 2 (two) times daily. ?Patient not taking: Reported on 07/12/2021 08/31/20   Laveda AbbeParks, Laurie Britton, NP  ?famotidine (PEPCID) 40 MG tablet Take  40 mg by mouth in the morning and at bedtime. 06/18/21   [provider]  ?gabapentin (NEURONTIN) 300 MG capsule Take 1 capsule (300 mg total) by mouth 3 (three) times daily. 08/31/20   Laveda AbbeParks, Laurie Britton, NP  ?QUEtiapine (SEROQUEL) 50 MG tablet Take 1 tablet (50 mg total) by mouth at bedtime. ?Patient taking differently: Take 50-150 mg by mouth at bedtime. 50 mg at lunchtime ?150 mg at bedtime 08/31/20   Laveda AbbeParks, Laurie Britton, NP  ?SUBOXONE 8-2 MG FILM Place 1 Film under the tongue 3 (three) times daily. 07/31/20   [provider]  ?SUMAtriptan (IMITREX) 100 MG tablet Take 100 mg by mouth every 4 (four) hours as needed. 07/01/21   [provider]  ?zolpidem (AMBIEN) 5 MG tablet Take 5 mg by mouth at bedtime as needed for sleep.    [provider]  ?   ? ?Allergies    ?Augmentin [amoxicillin-pot clavulanate], Vancomycin, Azithromycin, Erythromycin, Sudafed [pseudoephedrine hcl], Tramadol, Vancomycin, and Zithromax [azithromycin]   ? ?Review of Systems   ?Review of Systems  ?Constitutional:  Positive for appetite change. Negative for fever.  ?Respiratory:  Negative for shortness of breath.   ?Cardiovascular:  Negative for chest pain.  ?Gastrointestinal:  Positive for abdominal pain, nausea and vomiting. Negative for abdominal distention, blood in stool and diarrhea.  ?Genitourinary:  Negative for dysuria, pelvic pain, vaginal bleeding, vaginal discharge and vaginal pain.  ?Skin:  Negative for rash.  ?Neurological:  Negative for headaches.  ? ?Physical Exam ?Updated Vital Signs ?BP 114/70   Pulse (!) 102  Temp 98 ?F (36.7 ?C)   Resp 17   Ht 5\' 8"  (1.727 m)   Wt 59 kg   LMP 07/12/2021   SpO2 98%   BMI 19.78 kg/m?  ?Physical Exam ?Vitals and nursing note reviewed.  ?Constitutional:   ?   General: She is not in acute distress. ?   Appearance: Normal appearance.  ?HENT:  ?   Head: Normocephalic.  ?   Mouth/Throat:  ?   Mouth: Mucous membranes are moist.  ?Cardiovascular:  ?   Rate  and Rhythm: Normal rate.  ?Pulmonary:  ?   Effort: Pulmonary effort is normal. No respiratory distress.  ?Abdominal:  ?   General: Bowel sounds are normal. There is no distension.  ?   Palpations: Abdomen is soft.  ?   Tenderness: There is abdominal tenderness in the right lower quadrant and periumbilical area. There is no guarding or rebound.  ?Skin: ?   General: Skin is warm.  ?Neurological:  ?   Mental Status: She is alert and oriented to person, place, and time. Mental status is at baseline.  ?Psychiatric:     ?   Mood and Affect: Mood normal.  ? ? ?ED Results / Procedures / Treatments   ?Labs ?(all labs ordered are listed, but only abnormal results are displayed) ?Labs Reviewed  ?LIPASE, BLOOD - Abnormal; Notable for the following components:  ?    Result Value  ? Lipase <10 (*)   ? All other components within normal limits  ?COMPREHENSIVE METABOLIC PANEL - Abnormal; Notable for the following components:  ? BUN 24 (*)   ? Calcium 7.8 (*)   ? Total Protein 6.4 (*)   ? Albumin 3.4 (*)   ? Alkaline Phosphatase 34 (*)   ? All other components within normal limits  ?CBC - Abnormal; Notable for the following components:  ? Hemoglobin 11.6 (*)   ? All other components within normal limits  ? ? ?EKG ?None ? ?Radiology ?No results found. ? ?Procedures ?Procedures  ? ? ?Medications Ordered in ED ?Medications  ?sodium chloride 0.9 % bolus 1,000 mL (1,000 mLs Intravenous New Bag/Given 07/15/21 1343)  ?ondansetron (ZOFRAN) injection 4 mg (4 mg Intravenous Given 07/15/21 1344)  ? ? ?ED Course/ Medical Decision Making/ A&P ?  ?                        ?Medical Decision Making ?Amount and/or Complexity of Data Reviewed ?Labs: ordered. ?Radiology: ordered. ? ?Risk ?Prescription drug management. ? ? ?41 year old female presents emergency department with right-sided abdominal pain.  Started this morning, associated with nausea/vomiting.  No diarrhea.  No other genitourinary symptoms.  Currently on her menstrual cycle, she is status  post tubal ligation, denies being sexually active or possibly pregnant. ? ?Blood work is reassuring, no leukocytosis, abdominal labs are normal.  HCG negative. CT of the abdomen pelvis is unremarkable aside some trace fluid in the lower pelvis that is most likely physiologic, related to menstrual cycle.  No ovarian cyst or other findings of ovarian torsion.  Low suspicion for either of those diagnoses.  ? ?After medications patient feels improved, she been able to p.o. and is requesting to be discharged home.  Patient at this time appears safe and stable for discharge and close outpatient follow up. Discharge plan and strict return to ED precautions discussed, patient verbalizes understanding and agreement. ? ? ? ? ? ? ? ?Final Clinical Impression(s) / ED Diagnoses ?Final diagnoses:  ?  None  ? ? ?Rx / DC Orders ?ED Discharge Orders   ? ? None  ? ?  ? ? ?  ?Rozelle Logan, DO ?07/15/21 1553 ? ?

## 2021-07-15 NOTE — ED Notes (Signed)
Patient transported to CT at this Time. ?

## 2021-07-15 NOTE — Discharge Instructions (Addendum)
You have been seen and discharged from the emergency department.  Follow-up with your primary provider for further evaluation and further care. Take home medications as prescribed. If you have any worsening symptoms or further concerns for your health please return to an emergency department for further evaluation. 

## 2021-07-16 ENCOUNTER — Other Ambulatory Visit: Payer: Self-pay

## 2021-07-16 ENCOUNTER — Encounter (HOSPITAL_BASED_OUTPATIENT_CLINIC_OR_DEPARTMENT_OTHER): Payer: Self-pay

## 2021-07-16 DIAGNOSIS — R197 Diarrhea, unspecified: Secondary | ICD-10-CM | POA: Diagnosis not present

## 2021-07-16 DIAGNOSIS — R112 Nausea with vomiting, unspecified: Secondary | ICD-10-CM | POA: Insufficient documentation

## 2021-07-16 DIAGNOSIS — Z7982 Long term (current) use of aspirin: Secondary | ICD-10-CM | POA: Diagnosis not present

## 2021-07-16 DIAGNOSIS — R1084 Generalized abdominal pain: Secondary | ICD-10-CM | POA: Diagnosis not present

## 2021-07-16 LAB — URINALYSIS, ROUTINE W REFLEX MICROSCOPIC
Bilirubin Urine: NEGATIVE
Glucose, UA: NEGATIVE mg/dL
Nitrite: NEGATIVE
Protein, ur: 30 mg/dL — AB
Specific Gravity, Urine: 1.034 — ABNORMAL HIGH (ref 1.005–1.030)
pH: 5.5 (ref 5.0–8.0)

## 2021-07-16 LAB — CBC
HCT: 40.2 % (ref 36.0–46.0)
Hemoglobin: 12.9 g/dL (ref 12.0–15.0)
MCH: 28.8 pg (ref 26.0–34.0)
MCHC: 32.1 g/dL (ref 30.0–36.0)
MCV: 89.7 fL (ref 80.0–100.0)
Platelets: 282 10*3/uL (ref 150–400)
RBC: 4.48 MIL/uL (ref 3.87–5.11)
RDW: 12.4 % (ref 11.5–15.5)
WBC: 7.1 10*3/uL (ref 4.0–10.5)
nRBC: 0 % (ref 0.0–0.2)

## 2021-07-16 LAB — COMPREHENSIVE METABOLIC PANEL
ALT: 13 U/L (ref 0–44)
AST: 17 U/L (ref 15–41)
Albumin: 4 g/dL (ref 3.5–5.0)
Alkaline Phosphatase: 37 U/L — ABNORMAL LOW (ref 38–126)
Anion gap: 7 (ref 5–15)
BUN: 14 mg/dL (ref 6–20)
CO2: 30 mmol/L (ref 22–32)
Calcium: 8.6 mg/dL — ABNORMAL LOW (ref 8.9–10.3)
Chloride: 104 mmol/L (ref 98–111)
Creatinine, Ser: 0.69 mg/dL (ref 0.44–1.00)
GFR, Estimated: >60 mL/min (ref >60)
Glucose, Bld: 101 mg/dL — ABNORMAL HIGH (ref 70–99)
Potassium: 3.4 mmol/L — ABNORMAL LOW (ref 3.5–5.1)
Sodium: 141 mmol/L (ref 135–145)
Total Bilirubin: 0.4 mg/dL (ref 0.3–1.2)
Total Protein: 7.3 g/dL (ref 6.5–8.1)

## 2021-07-16 LAB — LIPASE, BLOOD: Lipase: <10 U/L — ABNORMAL LOW (ref 11–51)

## 2021-07-16 LAB — PREGNANCY, URINE: Preg Test, Ur: NEGATIVE

## 2021-07-16 NOTE — ED Triage Notes (Signed)
Patient here POV from Home with ABD Pain ? ?Patient endorses ABD Pain and N/V/D. Seen for Same yesterday in ED and Discharged but Symptoms continue especially N/V. Little to No PO Intake due to Same. ? ?NAD Noted during Triage. A&Ox4. GCS 15. Ambulatory. ?

## 2021-07-17 ENCOUNTER — Emergency Department (HOSPITAL_BASED_OUTPATIENT_CLINIC_OR_DEPARTMENT_OTHER)
Admission: EM | Admit: 2021-07-17 | Discharge: 2021-07-17 | Disposition: A | Discharge status: Home / Self Care | Payer: Medicaid Other | Attending: Emergency Medicine | Admitting: Emergency Medicine

## 2021-07-17 DIAGNOSIS — R112 Nausea with vomiting, unspecified: Secondary | ICD-10-CM

## 2021-07-17 MED ORDER — PROCHLORPERAZINE EDISYLATE 10 MG/2ML IJ SOLN
10.0000 mg | Freq: Once | INTRAMUSCULAR | Status: AC
  Administered 2021-07-17: 10 mg via INTRAVENOUS
  Filled 2021-07-17: qty 2

## 2021-07-17 MED ORDER — SODIUM CHLORIDE 0.9 % IV BOLUS
1000.0000 mL | Freq: Once | INTRAVENOUS | Status: AC
  Administered 2021-07-17: 1000 mL via INTRAVENOUS

## 2021-07-17 MED ORDER — DIPHENHYDRAMINE HCL 50 MG/ML IJ SOLN
25.0000 mg | Freq: Once | INTRAMUSCULAR | Status: AC
  Administered 2021-07-17: 25 mg via INTRAVENOUS
  Filled 2021-07-17: qty 1

## 2021-07-17 MED ORDER — ALUM & MAG HYDROXIDE-SIMETH 200-200-20 MG/5ML PO SUSP
30.0000 mL | Freq: Once | ORAL | Status: AC
Start: 2021-07-17 — End: 2021-07-17
  Administered 2021-07-17: 30 mL via ORAL
  Filled 2021-07-17: qty 30

## 2021-07-17 MED ORDER — PROMETHAZINE HCL 25 MG RE SUPP: 25.0000 mg | Freq: Four times a day (QID) | RECTAL | 0 refills | Status: DC | PRN

## 2021-07-17 NOTE — Discharge Instructions (Signed)
Take Imodium for diarrhea.  Take Zofran and or the suppositories that were prescribed to you for your nausea and vomiting.  Please return for worsening abdominal discomfort fever or inability to eat or drink. ?

## 2021-07-17 NOTE — ED Provider Notes (Signed)
?[facility] ?Provider Note ? ? ?CSN: [id number] ?Arrival date & time: 07/16/21  2104 ? ?  ? ?History ? ?Chief Complaint  ?Patient presents with  ? Emesis  ? ? ?[name] is a 41 y.o. female. ? ?41 yo F with a chief complaints of nausea vomiting and diarrhea.  This started yesterday and she came in to the emergency department and was seen and was having more right-sided abdominal discomfort and ended up having a CT scan blood work performed.  There was some fluid in the pelvis and it was thought that she could have had a ruptured cyst and she was discharged home.  She has not really been able to keep anything down at home per her.  Has been taking Zofran without improvement.  She is also been trying a over-the-counter diarrheal medicine that also has not seemed to work.  No fevers that she knows of but is felt subjectively hot and cold. ? ? ?Emesis ? ?  ? ?Home Medications ?Prior to Admission medications   ?Medication Sig Start Date End Date Taking? Authorizing Provider  ?promethazine (PHENERGAN) 25 MG suppository Place 1 suppository (25 mg total) rectally every 6 (six) hours as needed for nausea or vomiting. 07/17/21  Yes Melene Plan, DO  ?aspirin EC 325 MG EC tablet Take 1 tablet (325 mg total) by mouth daily. ?Patient not taking: Reported on 07/12/2021 11/16/18   West Bali, PA-C  ?Cholecalciferol (D3 HIGH POTENCY) 125 MCG (5000 UT) capsule Take 1 capsule (5,000 Units total) by mouth daily. ?Patient not taking: Reported on 07/12/2021 11/16/18   West Bali, PA-C  ?clonazePAM (KLONOPIN) 0.5 MG tablet Take 1 tablet (0.5 mg total) by mouth 2 (two) times daily as needed (anxiety). Use your home medication. Do not take this medication unless absolutely necessary. It is recommended that yo utry to discontinue taking this altogether. ?Patient taking differently: Take 0.5 mg by mouth 3 (three) times daily. 08/31/20   Laveda Abbe, NP  ?divalproex (DEPAKOTE SPRINKLE) 125 MG  capsule Take 2 capsules (250 mg total) by mouth every 12 (twelve) hours. ?Patient not taking: Reported on 07/12/2021 09/15/17   Charlton Amor, PA-C  ?divalproex (DEPAKOTE) 250 MG DR tablet Take 1 tablet (250 mg total) by mouth 2 (two) times daily. ?Patient not taking: Reported on 07/12/2021 08/31/20   Laveda Abbe, NP  ?famotidine (PEPCID) 40 MG tablet Take 40 mg by mouth in the morning and at bedtime. 06/18/21   [provider]  ?gabapentin (NEURONTIN) 300 MG capsule Take 1 capsule (300 mg total) by mouth 3 (three) times daily. 08/31/20   Laveda Abbe, NP  ?QUEtiapine (SEROQUEL) 50 MG tablet Take 1 tablet (50 mg total) by mouth at bedtime. ?Patient taking differently: Take 50-150 mg by mouth at bedtime. 50 mg at lunchtime ?150 mg at bedtime 08/31/20   Laveda Abbe, NP  ?SUBOXONE 8-2 MG FILM Place 1 Film under the tongue 3 (three) times daily. 07/31/20   [provider]  ?SUMAtriptan (IMITREX) 100 MG tablet Take 100 mg by mouth every 4 (four) hours as needed. 07/01/21   [provider]  ?zolpidem (AMBIEN) 5 MG tablet Take 5 mg by mouth at bedtime as needed for sleep.    [provider]  ?   ? ?Allergies    ?Augmentin [amoxicillin-pot clavulanate], Vancomycin, Azithromycin, Erythromycin, Sudafed [pseudoephedrine hcl], Tramadol, Vancomycin, and Zithromax [azithromycin]   ? ?Review of Systems   ?Review of Systems  ?Gastrointestinal:  Positive for vomiting.  ? ?Physical Exam ?Updated Vital Signs ?BP 100/66   Pulse 78   Temp 98.4 ?F (36.9 ?C) (Oral)   Resp 16   Ht 5\' 8"  (1.727 m)   Wt 59 kg   LMP 07/12/2021   SpO2 100%   BMI 19.78 kg/m?  ?Physical Exam ?Vitals and nursing note reviewed.  ?Constitutional:   ?   General: She is not in acute distress. ?   Appearance: She is well-developed. She is not diaphoretic.  ?HENT:  ?   Head: Normocephalic and atraumatic.  ?Eyes:  ?   Pupils: Pupils are equal, round, and reactive to light.  ?Cardiovascular:  ?   Rate and  Rhythm: Normal rate and regular rhythm.  ?   Heart sounds: No murmur heard. ?  No friction rub. No gallop.  ?Pulmonary:  ?   Effort: Pulmonary effort is normal.  ?   Breath sounds: No wheezing or rales.  ?Abdominal:  ?   General: There is no distension.  ?   Palpations: Abdomen is soft.  ?   Tenderness: There is abdominal tenderness.  ?   Comments: Mild diffuse abdominal discomfort without obvious focality  ?Musculoskeletal:     ?   General: No tenderness.  ?   Cervical back: Normal range of motion and neck supple.  ?Skin: ?   General: Skin is warm and dry.  ?Neurological:  ?   Mental Status: She is alert and oriented to person, place, and time.  ?Psychiatric:     ?   Behavior: Behavior normal.  ? ? ?ED Results / Procedures / Treatments   ?Labs ?(all labs ordered are listed, but only abnormal results are displayed) ?Labs Reviewed  ?LIPASE, BLOOD - Abnormal; Notable for the following components:  ?    Result Value  ? Lipase <10 (*)   ? All other components within normal limits  ?COMPREHENSIVE METABOLIC PANEL - Abnormal; Notable for the following components:  ? Potassium 3.4 (*)   ? Glucose, Bld 101 (*)   ? Calcium 8.6 (*)   ? Alkaline Phosphatase 37 (*)   ? All other components within normal limits  ?URINALYSIS, ROUTINE W REFLEX MICROSCOPIC - Abnormal; Notable for the following components:  ? APPearance HAZY (*)   ? Specific Gravity, Urine 1.034 (*)   ? Hgb urine dipstick LARGE (*)   ? Ketones, ur TRACE (*)   ? Protein, ur 30 (*)   ? Leukocytes,Ua MODERATE (*)   ? All other components within normal limits  ?CBC  ?PREGNANCY, URINE  ? ? ?EKG ?None ? ?Radiology ?CT Abdomen Pelvis W Contrast ? ?Result Date: 07/15/2021 ?CLINICAL DATA:  Right lower quadrant pain EXAM: CT ABDOMEN AND PELVIS WITH CONTRAST TECHNIQUE: Multidetector CT imaging of the abdomen and pelvis was performed using the standard protocol following bolus administration of intravenous contrast. RADIATION DOSE REDUCTION: This exam was performed according to  the departmental dose-optimization program which includes automated exposure control, adjustment of the mA and/or kV according to patient size and/or use of iterative reconstruction technique. CONTRAST:  28mL OMNIPAQUE IOHEXOL 300 MG/ML  SOLN COMPARISON:  CT abdomen and pelvis 10/18/2013 FINDINGS: Lower chest: No acute abnormality. Hepatobiliary: Liver is normal in size and contour with no suspicious mass identified. Small hypodensity adjacent to the falciform ligament likely represents focal fatty infiltration. Gallbladder appears normal. No biliary ductal dilatation. Pancreas: Unremarkable. No pancreatic ductal dilatation or surrounding inflammatory changes. Spleen: Normal in size without focal abnormality. Adrenals/Urinary Tract: Chronic right adrenal  gland calcifications. Left adrenal gland is normal. Kidneys appear normal. Urinary bladder is normal. Stomach/Bowel: No bowel obstruction, free air or pneumatosis. No bowel wall edema visualized. Appendix is normal. Vascular/Lymphatic: No significant vascular findings are present. No enlarged abdominal or pelvic lymph nodes. Reproductive: Uterus is unremarkable. No suspicious adnexal mass visualized. Small amount of free fluid in the right adnexa, most likely physiologic. Other: None. Musculoskeletal: No suspicious bony lesions identified. IMPRESSION: 1. No definite acute process identified in the abdomen or pelvis. 2. Small amount of free fluid in the right adnexa which is most likely physiologic in nature. Correlate clinically for need for pelvic ultrasound. 3. Other chronic findings. Electronically Signed   By: Jannifer Hickelaney  Williams M.D.   On: 07/15/2021 14:46   ? ?Procedures ?Procedures  ? ? ?Medications Ordered in ED ?Medications  ?prochlorperazine (COMPAZINE) injection 10 mg (10 mg Intravenous Given 07/17/21 0150)  ?diphenhydrAMINE (BENADRYL) injection 25 mg (25 mg Intravenous Given 07/17/21 0144)  ?sodium chloride 0.9 % bolus 1,000 mL (0 mLs Intravenous Stopped  07/17/21 0246)  ?alum & mag hydroxide-simeth (MAALOX/MYLANTA) 200-200-20 MG/5ML suspension 30 mL (30 mLs Oral Given 07/17/21 0143)  ? ? ?ED Course/ Medical Decision Making/ A&P ?  ?                        ?Medical Decision

## 2021-07-26 ENCOUNTER — Other Ambulatory Visit: Payer: Self-pay

## 2021-07-26 ENCOUNTER — Emergency Department (HOSPITAL_COMMUNITY): Payer: Medicaid Other

## 2021-07-26 ENCOUNTER — Emergency Department (HOSPITAL_COMMUNITY)
Admission: EM | Admit: 2021-07-26 | Discharge: 2021-07-26 | Disposition: A | Discharge status: Home / Self Care | Payer: Medicaid Other | Attending: Emergency Medicine | Admitting: Emergency Medicine

## 2021-07-26 DIAGNOSIS — Z20822 Contact with and (suspected) exposure to covid-19: Secondary | ICD-10-CM | POA: Insufficient documentation

## 2021-07-26 DIAGNOSIS — G44209 Tension-type headache, unspecified, not intractable: Secondary | ICD-10-CM

## 2021-07-26 DIAGNOSIS — R569 Unspecified convulsions: Secondary | ICD-10-CM

## 2021-07-26 DIAGNOSIS — Z7982 Long term (current) use of aspirin: Secondary | ICD-10-CM | POA: Insufficient documentation

## 2021-07-26 DIAGNOSIS — Z79899 Other long term (current) drug therapy: Secondary | ICD-10-CM | POA: Insufficient documentation

## 2021-07-26 LAB — URINALYSIS, ROUTINE W REFLEX MICROSCOPIC
Bilirubin Urine: NEGATIVE
Glucose, UA: NEGATIVE mg/dL
Hgb urine dipstick: NEGATIVE
Ketones, ur: 5 mg/dL — AB
Leukocytes,Ua: NEGATIVE
Nitrite: NEGATIVE
Protein, ur: NEGATIVE mg/dL
Specific Gravity, Urine: 1.018 (ref 1.005–1.030)
pH: 7 (ref 5.0–8.0)

## 2021-07-26 LAB — VALPROIC ACID LEVEL: Valproic Acid Lvl: <10 ug/mL — ABNORMAL LOW (ref 50.0–100.0)

## 2021-07-26 LAB — COMPREHENSIVE METABOLIC PANEL
ALT: 83 U/L — ABNORMAL HIGH (ref 0–44)
AST: 79 U/L — ABNORMAL HIGH (ref 15–41)
Albumin: 3.8 g/dL (ref 3.5–5.0)
Alkaline Phosphatase: 55 U/L (ref 38–126)
Anion gap: 9 (ref 5–15)
BUN: 5 mg/dL — ABNORMAL LOW (ref 6–20)
CO2: 27 mmol/L (ref 22–32)
Calcium: 9.1 mg/dL (ref 8.9–10.3)
Chloride: 104 mmol/L (ref 98–111)
Creatinine, Ser: 0.96 mg/dL (ref 0.44–1.00)
GFR, Estimated: >60 mL/min (ref >60)
Glucose, Bld: 109 mg/dL — ABNORMAL HIGH (ref 70–99)
Potassium: 4.1 mmol/L (ref 3.5–5.1)
Sodium: 140 mmol/L (ref 135–145)
Total Bilirubin: 0.6 mg/dL (ref 0.3–1.2)
Total Protein: 7.3 g/dL (ref 6.5–8.1)

## 2021-07-26 LAB — RAPID URINE DRUG SCREEN, HOSP PERFORMED
Amphetamines: POSITIVE — AB
Barbiturates: NOT DETECTED
Benzodiazepines: POSITIVE — AB
Cocaine: NOT DETECTED
Opiates: NOT DETECTED
Tetrahydrocannabinol: NOT DETECTED

## 2021-07-26 LAB — CBC
HCT: 40.5 % (ref 36.0–46.0)
Hemoglobin: 13.3 g/dL (ref 12.0–15.0)
MCH: 28.7 pg (ref 26.0–34.0)
MCHC: 32.8 g/dL (ref 30.0–36.0)
MCV: 87.5 fL (ref 80.0–100.0)
Platelets: 332 10*3/uL (ref 150–400)
RBC: 4.63 MIL/uL (ref 3.87–5.11)
RDW: 12.7 % (ref 11.5–15.5)
WBC: 10.7 10*3/uL — ABNORMAL HIGH (ref 4.0–10.5)
nRBC: 0 % (ref 0.0–0.2)

## 2021-07-26 LAB — RESP PANEL BY RT-PCR (FLU A&B, COVID) ARPGX2
Influenza A by PCR: NEGATIVE
Influenza B by PCR: NEGATIVE
SARS Coronavirus 2 by RT PCR: NEGATIVE

## 2021-07-26 LAB — ETHANOL: Alcohol, Ethyl (B): <10 mg/dL (ref <10)

## 2021-07-26 LAB — HCG, QUANTITATIVE, PREGNANCY: hCG, Beta Chain, Quant, S: 1 m[IU]/mL (ref <5)

## 2021-07-26 MED ORDER — VALPROATE SODIUM 100 MG/ML IV SOLN
500.0000 mg | Freq: Once | INTRAVENOUS | Status: AC
  Administered 2021-07-26: 500 mg via INTRAVENOUS
  Filled 2021-07-26: qty 5

## 2021-07-26 MED ORDER — DIVALPROEX SODIUM 125 MG PO CSDR: 250.0000 mg | DELAYED_RELEASE_CAPSULE | Freq: Two times a day (BID) | ORAL | 0 refills | Status: DC

## 2021-07-26 MED ORDER — LACTATED RINGERS IV BOLUS
1000.0000 mL | Freq: Once | INTRAVENOUS | Status: AC
Start: 2021-07-26 — End: 2021-07-26
  Administered 2021-07-26: 1000 mL via INTRAVENOUS

## 2021-07-26 MED ORDER — ACETAMINOPHEN 500 MG PO TABS
1000.0000 mg | ORAL_TABLET | Freq: Once | ORAL | Status: AC
  Administered 2021-07-26: 1000 mg via ORAL
  Filled 2021-07-26: qty 2

## 2021-07-26 NOTE — ED Notes (Signed)
Patient transported to CT 

## 2021-07-26 NOTE — ED Triage Notes (Addendum)
Pt brought to ED by [facility] via wheelchair with c/o of possible seizure activity at home that was witnessed by family, each episode lasting approximately 45 seconds. Family reports 3 episodes. No activity en route to facility.  Pt also reports"weaning" herself off suboxone without medical direction.  ? ?EMS VITALS ? HR 96 ?BP 120/80 ?SPO2 98% ROOM AIR ?CBG 112 ?

## 2021-07-26 NOTE — ED Notes (Signed)
Pt seen ambulating to bathroom with steady gait and back to bed, no assistance needed ?

## 2021-07-26 NOTE — Discharge Instructions (Addendum)
Department of Motor Vehicle (DMV) of [place] regulations for seizures - It is the patient's responsibility to report the incidence of the seizure in the state of [place]. [name] has no statutory provision requiring physicians to report patients diagnosed with epilepsy or seizures to a central state agency.  The recommended DMV regulation requirement for a driver in [place] for an individual with a seizure is that they be seizure-free for 6-12 months. However, the DMV may consider the following exceptions to this general rule where: (1) a physician-directed change in medication causes a seizure and the individual immediately resumes the previous therapy which controlled seizures; (2) there is a history of nocturnal seizures or seizures which do not involve loss of consciousness, loss of control of motor function, or loss of appropriate sensation and information process; and (3) an individual has a seizure disorder preceded by an aura (warning) lasting 2-3 minutes. While the DMV may also give consideration to other unusual circumstances which may affect the general requirement that drivers be seizure-free for 6-12 months, interpretation of these circumstances and assignment of restrictions is at the discretion of the Medical Advisor. The DMV also considers compliance with medical therapy essential for safe driving. [The [place] Physician's Guide to Driver Medical Evaluation (June, 1995 ed.)] The Department learns of an individual's condition by inquiring on the application form or renewal form, a physician's report to the DMV, an accident report or from correspondence from the individual. The person may be required to submit a Medical Report Form either annually or semi-annually.  
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
## 2021-07-26 NOTE — ED Provider Notes (Signed)
?  Physical Exam  ?BP (!) 128/103 (BP Location: Left Arm)   Pulse 100   Temp 98.7 ?F (37.1 ?C) (Oral)   Resp 17   LMP 07/12/2021   SpO2 97%  ? ? ? ?Procedures  ?Procedures ? ?ED Course / MDM  ? ?Clinical Course as of 07/26/21 0847  ?Mon Jul 26, 2021  ?[id number] Total Protein: 7.3 [JL]  ?  ?Clinical Course User Index ?[JL] Regan Lemming, MD  ? ?Medical Decision Making ?Amount and/or Complexity of Data Reviewed ?Labs: ordered. Decision-making details documented in ED Course. ?Radiology: ordered. ? ?Risk ?OTC drugs. ?Prescription drug management. ? ? ?78F, presenting with three witness seizures overnight, 45s episodes each, generalized shaking, postictal state after. Recent meth use. Takes Depakote, level pending. She is back to baseline. Potentially discuss with neuro regarding making changes to medications. On Klonopin hasn't missed any doses.  ? ?0840 ?Evaluated the patient bedside. She endorses a headache and neck pain. Low grade temperature. Last used meth 4 days ago. Denies EtOH use. States she has been taking her Depakote but her level today is low. Borderline tachycardic, mild leukocytosis. Mentating well. Tylenol, IVF bolus, and Depakote IV ordered. Discussed with the patient potential lumbar puncture. Neurology consulted.  ? ?0945 ?Evaluated the patient bedside.  She has negative Kernig and Brudzinski signs.  Good range of motion of the neck.  Her headache is minimal described as more tension type she does endorse a little bit of neck discomfort but overall no concerning meningeal findings.  She is not febrile.  She had 3 episodes of seizure-like activity.  Reportedly one episode lasted for 4 minutes and resulted in loss of consciousness and generalized shaking.  No tongue biting or urinary incontinence.  Her third episode was witnessed by EMS not family and was described more as a panic attack during which time the patient briefly lost consciousness after hyperventilating and developing tingling in hand hand  bilaterally, likely from hyperventilation. Low suspicion for bacterial meningitis at this time. CT Head obtained and negative for acute abnormality.COVID and flu negative. Patient is afebrile and at her baseline mental status. ? ?The patient admits to noncompliance with her Depakote over the past few days after pressing the issue.  ? ?[id number] ?Discussed case with Dr. Cheral Marker of neurology. Recommended load of 1000mg  of Depakote which is close to 20mg /kg. Additional 500mg  IV ordered. Starting patient on 5mg /kg of home Depakote. Seizure precautions, outpatient neuro follow-up. Reassessed after IVF and Tylenol, feeling symptomatically improved. Stable for DC. ? ? ?  ?Regan Lemming, MD ?07/26/21 1104 ? ?

## 2021-08-24 ENCOUNTER — Encounter: Payer: Self-pay | Admitting: Neurology

## 2021-08-24 ENCOUNTER — Ambulatory Visit: Payer: Medicaid Other | Admitting: Neurology

## 2021-08-24 VITALS — BP 110/74 | HR 105 | Ht 68.0 in | Wt 133.0 lb

## 2021-08-24 DIAGNOSIS — G40909 Epilepsy, unspecified, not intractable, without status epilepticus: Secondary | ICD-10-CM

## 2021-08-24 NOTE — Patient Instructions (Signed)
Continue with Depakote 250 mg twice daily ?We will check liver enzymes and Depakote level.  Last liver enzymes were elevated, if they normalize plan will be to increase Depakote but if not I will refer patient to primary care doctor for further evaluation of elevated liver enzymes and add Trileptal. ?Routine EEG ?Follow-up in 29-month ?
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
## 2021-08-30 ENCOUNTER — Ambulatory Visit: Payer: Medicaid Other | Admitting: Neurology

## 2021-08-30 ENCOUNTER — Other Ambulatory Visit (INDEPENDENT_AMBULATORY_CARE_PROVIDER_SITE_OTHER): Payer: Self-pay

## 2021-08-30 DIAGNOSIS — G40909 Epilepsy, unspecified, not intractable, without status epilepticus: Secondary | ICD-10-CM

## 2021-08-30 DIAGNOSIS — Z0289 Encounter for other administrative examinations: Secondary | ICD-10-CM

## 2021-08-31 ENCOUNTER — Telehealth: Payer: Self-pay | Admitting: *Deleted

## 2021-08-31 LAB — HEPATIC FUNCTION PANEL
ALT: 8 IU/L (ref 0–32)
AST: 14 IU/L (ref 0–40)
Albumin: 4.3 g/dL (ref 3.8–4.8)
Alkaline Phosphatase: 41 IU/L — ABNORMAL LOW (ref 44–121)
Bilirubin Total: <0.2 mg/dL (ref 0.0–1.2)
Bilirubin, Direct: <0.1 mg/dL (ref 0.00–0.40)
Total Protein: 7.3 g/dL (ref 6.0–8.5)

## 2021-08-31 LAB — VALPROIC ACID LEVEL: Valproic Acid Lvl: 41 ug/mL — ABNORMAL LOW (ref 50–100)

## 2021-08-31 MED ORDER — DIVALPROEX SODIUM 500 MG PO DR TAB: 500.0000 mg | DELAYED_RELEASE_TABLET | Freq: Every day | ORAL | 11 refills | Status: DC

## 2021-08-31 NOTE — Telephone Encounter (Signed)
I spoke to her father on [place]. He verbalized understanding and will relay the information to the patient.  ?

## 2021-08-31 NOTE — Telephone Encounter (Signed)
-----   Message from Windell Norfolk, MD sent at 08/31/2021  4:40 PM EDT ----- ?Please call and inform patient that her recent EEG (Brain wave test) was normal. In particular, there were no epileptiform discharges and no seizures. No further action is required on this test at this time. Please keep any upcoming appointments or tests and  call us with any interim questions, concerns, problems or updates. Thanks,  ? ?Windell Norfolk, MD ?  ?

## 2021-08-31 NOTE — Progress Notes (Signed)
Please call and inform patient that her recent EEG (Brain wave test) was normal. In particular, there were no epileptiform discharges and no seizures. No further action is required on this test at this time. Please keep any upcoming appointments or tests and  call us with any interim questions, concerns, problems or updates. Thanks,   Ginelle Bays, MD 

## 2021-08-31 NOTE — Progress Notes (Signed)
Spoke with patient, informed her that her liver function normalized and that I will increase her Depakote to 500 mg BID. She is comfortable with plan.  ? ?I will see her in 3 months as scheduled.  ? ?Dr. Teresa Coombs

## 2021-08-31 NOTE — Procedures (Signed)
? ? ?  History: ? ?41 year old woman with seizure disorder  ? ?EEG classification: Awake and drowsy ? ?Description of the recording: The background rhythms of this recording consists of a fairly well modulated medium amplitude alpha rhythm of 9-10 Hz that is reactive to eye opening and closure. As the record progresses, the patient appears to remain in the waking state throughout the recording. Photic stimulation was performed, did not show any abnormalities. Hyperventilation was also performed, did not show any abnormalities. Toward the end of the recording, the patient enters the drowsy state with slight symmetric slowing seen. The patient never enters stage II sleep. No abnormal epileptiform discharges seen during this recording. There was no focal slowing. EKG monitor shows no evidence of cardiac rhythm abnormalities with a heart rate of 72. ? ?Abnormality: None  ? ?Impression: This is a normal EEG recording in the waking and drowsy state. No evidence of interictal epileptiform discharges seen. A normal EEG does not exclude a diagnosis of epilepsy.  ? ? ?Windell Norfolk, MD ?[facility] ?  ?

## 2021-10-09 ENCOUNTER — Encounter (HOSPITAL_BASED_OUTPATIENT_CLINIC_OR_DEPARTMENT_OTHER): Payer: Self-pay

## 2021-10-09 ENCOUNTER — Emergency Department (HOSPITAL_BASED_OUTPATIENT_CLINIC_OR_DEPARTMENT_OTHER)
Admission: EM | Admit: 2021-10-09 | Discharge: 2021-10-09 | Disposition: A | Discharge status: Home / Self Care | Payer: Medicaid Other | Attending: Emergency Medicine | Admitting: Emergency Medicine

## 2021-10-09 DIAGNOSIS — K625 Hemorrhage of anus and rectum: Secondary | ICD-10-CM | POA: Diagnosis present

## 2021-10-09 DIAGNOSIS — D649 Anemia, unspecified: Secondary | ICD-10-CM | POA: Diagnosis not present

## 2021-10-09 DIAGNOSIS — J45909 Unspecified asthma, uncomplicated: Secondary | ICD-10-CM | POA: Insufficient documentation

## 2021-10-09 LAB — CBC
HCT: 33.8 % — ABNORMAL LOW (ref 36.0–46.0)
Hemoglobin: 11.2 g/dL — ABNORMAL LOW (ref 12.0–15.0)
MCH: 28.4 pg (ref 26.0–34.0)
MCHC: 33.1 g/dL (ref 30.0–36.0)
MCV: 85.6 fL (ref 80.0–100.0)
Platelets: 228 10*3/uL (ref 150–400)
RBC: 3.95 MIL/uL (ref 3.87–5.11)
RDW: 13.2 % (ref 11.5–15.5)
WBC: 5.9 10*3/uL (ref 4.0–10.5)
nRBC: 0 % (ref 0.0–0.2)

## 2021-10-09 LAB — COMPREHENSIVE METABOLIC PANEL
ALT: 6 U/L (ref 0–44)
AST: 11 U/L — ABNORMAL LOW (ref 15–41)
Albumin: 4.1 g/dL (ref 3.5–5.0)
Alkaline Phosphatase: 27 U/L — ABNORMAL LOW (ref 38–126)
Anion gap: 9 (ref 5–15)
BUN: 19 mg/dL (ref 6–20)
CO2: 25 mmol/L (ref 22–32)
Calcium: 9.1 mg/dL (ref 8.9–10.3)
Chloride: 106 mmol/L (ref 98–111)
Creatinine, Ser: 0.69 mg/dL (ref 0.44–1.00)
GFR, Estimated: >60 mL/min (ref >60)
Glucose, Bld: 89 mg/dL (ref 70–99)
Potassium: 3.4 mmol/L — ABNORMAL LOW (ref 3.5–5.1)
Sodium: 140 mmol/L (ref 135–145)
Total Bilirubin: 0.6 mg/dL (ref 0.3–1.2)
Total Protein: 7.1 g/dL (ref 6.5–8.1)

## 2021-10-09 LAB — OCCULT BLOOD X 1 CARD TO LAB, STOOL: Fecal Occult Bld: POSITIVE — AB

## 2021-10-09 LAB — LIPASE, BLOOD: Lipase: <10 U/L — ABNORMAL LOW (ref 11–51)

## 2021-10-09 LAB — URINALYSIS, ROUTINE W REFLEX MICROSCOPIC
Bilirubin Urine: NEGATIVE
Glucose, UA: NEGATIVE mg/dL
Hgb urine dipstick: NEGATIVE
Ketones, ur: NEGATIVE mg/dL
Nitrite: NEGATIVE
Protein, ur: 30 mg/dL — AB
Specific Gravity, Urine: 1.033 — ABNORMAL HIGH (ref 1.005–1.030)
pH: 5.5 (ref 5.0–8.0)

## 2021-10-09 LAB — WET PREP, GENITAL
Clue Cells Wet Prep HPF POC: NONE SEEN
Sperm: NONE SEEN
Trich, Wet Prep: NONE SEEN
WBC, Wet Prep HPF POC: >=10 — AB (ref <10)
Yeast Wet Prep HPF POC: NONE SEEN

## 2021-10-09 LAB — PREGNANCY, URINE: Preg Test, Ur: NEGATIVE

## 2021-10-09 MED ORDER — LORAZEPAM 2 MG/ML IJ SOLN
1.0000 mg | Freq: Once | INTRAMUSCULAR | Status: AC
  Administered 2021-10-09: 1 mg via INTRAVENOUS
  Filled 2021-10-09: qty 1

## 2021-10-09 MED ORDER — ALBENDAZOLE 200 MG PO TABS: ORAL_TABLET | ORAL | 0 refills | Status: DC

## 2021-10-09 MED ORDER — SODIUM CHLORIDE 0.9 % IV BOLUS
1000.0000 mL | Freq: Once | INTRAVENOUS | Status: AC
  Administered 2021-10-09: 1000 mL via INTRAVENOUS

## 2021-10-09 MED ORDER — FERROUS SULFATE 325 (65 FE) MG PO TABS: 325.0000 mg | ORAL_TABLET | Freq: Every day | ORAL | 0 refills | Status: DC

## 2021-10-09 NOTE — ED Provider Notes (Signed)
[facility] EMERGENCY DEPT Provider Note   CSN: [id number] Arrival date & time: 10/09/21  7353     History  Chief Complaint  Patient presents with   Rectal Bleeding    [name] is a 41 y.o. female.  Pt is a 41 yo female with a pmhx significant for asthma, anxiety, depression, seizures, bipolar d/o, and polysubstance abuse on Suboxone.  Pt said she's had blood in her stool for a month.  She's had some abd pain for a few days.  She also thinks she has worms in her stool.  She googled it and said it looked like a liver fluke.  She has not traveled outside the [place], nor has she had any uncooked food.  She has a dog, but she is not aware of the dog having worms.  Pt told the nurse she has a hx of Crohn's disease, but she does not have this hx.  She said she had blood in her stool when she was 25 and a doctor told her that she may have Crohn's.  She was supposed to see a GI doctor, but never has seen one.  She asks for a GI referral.  She denies any fever.  She's had nausea for which she's been taking phenergan.       Home Medications Prior to Admission medications   Medication Sig Start Date End Date Taking? Authorizing Provider  albendazole (ALBENZA) 200 MG tablet Take 400 mg once on an empty stomach and repeat in 2 weeks. 10/09/21  Yes Jacalyn Lefevre, MD  ferrous sulfate 325 (65 FE) MG tablet Take 1 tablet (325 mg total) by mouth daily. 10/09/21  Yes Jacalyn Lefevre, MD  Cholecalciferol (D3 HIGH POTENCY) 125 MCG (5000 UT) capsule Take 1 capsule (5,000 Units total) by mouth daily. 11/16/18   West Bali, PA-C  clonazePAM (KLONOPIN) 0.5 MG tablet Take 1 tablet (0.5 mg total) by mouth 2 (two) times daily as needed (anxiety). Use your home medication. Do not take this medication unless absolutely necessary. It is recommended that yo utry to discontinue taking this altogether. Patient taking differently: Take 0.5 mg by mouth 3 (three) times daily. 08/31/20   Laveda Abbe, NP  divalproex (DEPAKOTE) 500 MG DR tablet Take 1 tablet (500 mg total) by mouth daily. 08/31/21   Windell Norfolk, MD  famotidine (PEPCID) 40 MG tablet Take 40 mg by mouth in the morning and at bedtime. 06/18/21   [provider]  gabapentin (NEURONTIN) 300 MG capsule Take 1 capsule (300 mg total) by mouth 3 (three) times daily. Patient taking differently: Take 300 mg by mouth 3 (three) times daily as needed (pain). 08/31/20   Laveda Abbe, NP  promethazine (PHENERGAN) 25 MG suppository Place 1 suppository (25 mg total) rectally every 6 (six) hours as needed for nausea or vomiting. 07/17/21   Melene Plan, DO  QUEtiapine (SEROQUEL) 50 MG tablet Take 1 tablet (50 mg total) by mouth at bedtime. Patient taking differently: Take 100 mg by mouth at bedtime. 08/31/20   Laveda Abbe, NP  SUBOXONE 8-2 MG FILM Place 1 Film under the tongue 3 (three) times daily. 07/31/20   [provider]  SUMAtriptan (IMITREX) 100 MG tablet Take 100 mg by mouth every 4 (four) hours as needed for headache or migraine. 07/01/21   [provider]  VRAYLAR 1.5 MG capsule Take 1.5 mg by mouth daily. 08/11/21   [provider]      Allergies  Vancomycin, Azithromycin, Erythromycin, Sudafed [pseudoephedrine hcl], Tramadol, Vancomycin, and Zithromax [azithromycin]    Review of Systems   Review of Systems  Gastrointestinal:  Positive for abdominal pain and blood in stool.  All other systems reviewed and are negative.   Physical Exam Updated Vital Signs BP 119/90   Pulse 98   Temp 98.2 F (36.8 C) (Oral)   Resp 16   Ht 5\' 8"  (1.727 m)   Wt 61 kg   LMP 09/09/2021 (Approximate)   SpO2 99%   BMI 20.45 kg/m  Physical Exam Vitals and nursing note reviewed. Exam conducted with a chaperone present.  Constitutional:      Appearance: Normal appearance.  HENT:     Head: Normocephalic and atraumatic.     Right Ear: External ear normal.     Left Ear: External ear normal.      Nose: Nose normal.     Mouth/Throat:     Mouth: Mucous membranes are moist.     Pharynx: Oropharynx is clear.  Eyes:     Extraocular Movements: Extraocular movements intact.     Conjunctiva/sclera: Conjunctivae normal.     Pupils: Pupils are equal, round, and reactive to light.  Cardiovascular:     Rate and Rhythm: Normal rate and regular rhythm.     Pulses: Normal pulses.     Heart sounds: Normal heart sounds.  Pulmonary:     Effort: Pulmonary effort is normal.     Breath sounds: Normal breath sounds.  Abdominal:     General: Abdomen is flat. Bowel sounds are normal.     Palpations: Abdomen is soft.  Genitourinary:    Exam position: Lithotomy position.     Vagina: Normal.     Cervix: Normal.     Uterus: Normal.      Adnexa: Right adnexa normal and left adnexa normal.     Rectum: Normal. Guaiac result positive.  Musculoskeletal:        General: Normal range of motion.     Cervical back: Normal range of motion and neck supple.  Skin:    General: Skin is warm.     Capillary Refill: Capillary refill takes less than 2 seconds.  Neurological:     General: No focal deficit present.     Mental Status: She is alert and oriented to person, place, and time.  Psychiatric:        Mood and Affect: Mood normal.        Behavior: Behavior normal.     ED Results / Procedures / Treatments   Labs (all labs ordered are listed, but only abnormal results are displayed) Labs Reviewed  WET PREP, GENITAL - Abnormal; Notable for the following components:      Result Value   WBC, Wet Prep HPF POC >=10 (*)    All other components within normal limits  LIPASE, BLOOD - Abnormal; Notable for the following components:   Lipase <10 (*)    All other components within normal limits  COMPREHENSIVE METABOLIC PANEL - Abnormal; Notable for the following components:   Potassium 3.4 (*)    AST 11 (*)    Alkaline Phosphatase 27 (*)    All other components within normal limits  CBC - Abnormal;  Notable for the following components:   Hemoglobin 11.2 (*)    HCT 33.8 (*)    All other components within normal limits  URINALYSIS, ROUTINE W REFLEX MICROSCOPIC - Abnormal; Notable for the following components:   APPearance HAZY (*)  Specific Gravity, Urine 1.033 (*)    Protein, ur 30 (*)    Leukocytes,Ua SMALL (*)    Bacteria, UA RARE (*)    All other components within normal limits  OCCULT BLOOD X 1 CARD TO LAB, STOOL - Abnormal; Notable for the following components:   Fecal Occult Bld POSITIVE (*)    All other components within normal limits  OVA + PARASITE EXAM  GASTROINTESTINAL PANEL BY PCR, STOOL (REPLACES STOOL CULTURE)  PREGNANCY, URINE  GC/CHLAMYDIA PROBE AMP ([place]) NOT AT [facility]    EKG None  Radiology No results found.  Procedures Procedures    Medications Ordered in ED Medications  sodium chloride 0.9 % bolus 1,000 mL (1,000 mLs Intravenous New Bag/Given 10/09/21 0801)  LORazepam (ATIVAN) injection 1 mg (1 mg Intravenous Given 10/09/21 1017)    ED Course/ Medical Decision Making/ A&P                           Medical Decision Making Amount and/or Complexity of Data Reviewed Labs: ordered.  Risk OTC drugs. Prescription drug management.   This patient presents to the ED for concern of abd pain and blood in stool, this involves an extensive number of treatment options, and is a complaint that carries with it a high risk of complications and morbidity.  The differential diagnosis includes gastritis, cholecystitis, appendicitis, pregnancy, uti, infection   Co morbidities that complicate the patient evaluation  asthma, anxiety, depression, seizures, bipolar d/o, and polysubstance abuse on Suboxone   Additional history obtained:  Additional history obtained from epic chart review   Lab Tests:  I Ordered, and personally interpreted labs.  The pertinent results include:  ua with no infection, preg neg; cbc with hgb 11.2 (down from 13.3 on  43)  Cardiac Monitoring:  The patient was maintained on a cardiac monitor.  I personally viewed and interpreted the cardiac monitored which showed an underlying rhythm of: nsr   Medicines ordered and prescription drug management:  I ordered medication including ivfs  for dehydration  Reevaluation of the patient after these medicines showed that the patient improved I have reviewed the patients home medicines and have made adjustments as needed   Test Considered:  Ct abd/pelvis.  However, WBC is nl and she is afebrile.  CT from March reviewed.   Critical Interventions:  ivfs    Problem List / ED Course:  Abd pain:  Pt is afebrile and wbc is nl.  Abdomen is not terribly painful.  I doubt a surgical abd. Pt did show me some pictures of her stool and she does have either mucous or worms.  I will treat for pinworms as that is the most common.  She was unable to produce a stool to send for O&P.  She has had intermittent rectal bleeding for years.  Hgb is a little lower than normal.  I will start her on iron.  She is referred to gi.  She is to return if worse. F/u with pcp.   Reevaluation:  After the interventions noted above, I reevaluated the patient and found that they have :improved   Social Determinants of Health:  Lives at home   Dispostion:  After consideration of the diagnostic results and the patients response to treatment, I feel that the patent would benefit from discharge with outpatient f/u.          Final Clinical Impression(s) / ED Diagnoses Final diagnoses:  Rectal bleeding  Anemia,  unspecified type    Rx / DC Orders ED Discharge Orders          Ordered    Ambulatory referral to Gastroenterology        10/09/21 1002    ferrous sulfate 325 (65 FE) MG tablet  Daily        10/09/21 1003    albendazole (ALBENZA) 200 MG tablet        10/09/21 1005              Jacalyn Lefevre, MD 10/09/21 1008

## 2021-10-09 NOTE — ED Triage Notes (Signed)
She c/o "not feeling right" x 1 month. She tells me she has hx of Crohn's and hx of migraine. She is here today d/t passing a stool "that had some blood in it and what looked like a translucent worm". She is ambulatory and in no distress.

## 2021-10-12 LAB — GC/CHLAMYDIA PROBE AMP (~~LOC~~) NOT AT ARMC
Chlamydia: NEGATIVE
Comment: NEGATIVE
Comment: NORMAL
Neisseria Gonorrhea: NEGATIVE

## 2021-12-01 ENCOUNTER — Encounter (HOSPITAL_BASED_OUTPATIENT_CLINIC_OR_DEPARTMENT_OTHER): Payer: Self-pay

## 2021-12-01 ENCOUNTER — Other Ambulatory Visit: Payer: Self-pay

## 2021-12-01 ENCOUNTER — Ambulatory Visit (INDEPENDENT_AMBULATORY_CARE_PROVIDER_SITE_OTHER): Payer: Medicaid Other | Admitting: Neurology

## 2021-12-01 ENCOUNTER — Encounter: Payer: Self-pay | Admitting: Neurology

## 2021-12-01 ENCOUNTER — Emergency Department (HOSPITAL_BASED_OUTPATIENT_CLINIC_OR_DEPARTMENT_OTHER): Payer: Medicaid Other | Admitting: Radiology

## 2021-12-01 VITALS — BP 121/73 | HR 119 | Ht 68.0 in | Wt 129.0 lb

## 2021-12-01 DIAGNOSIS — J45901 Unspecified asthma with (acute) exacerbation: Secondary | ICD-10-CM | POA: Diagnosis not present

## 2021-12-01 DIAGNOSIS — F1590 Other stimulant use, unspecified, uncomplicated: Secondary | ICD-10-CM | POA: Diagnosis not present

## 2021-12-01 DIAGNOSIS — Z79899 Other long term (current) drug therapy: Secondary | ICD-10-CM | POA: Diagnosis not present

## 2021-12-01 DIAGNOSIS — F419 Anxiety disorder, unspecified: Secondary | ICD-10-CM | POA: Insufficient documentation

## 2021-12-01 DIAGNOSIS — G40909 Epilepsy, unspecified, not intractable, without status epilepticus: Secondary | ICD-10-CM | POA: Diagnosis not present

## 2021-12-01 DIAGNOSIS — R0602 Shortness of breath: Secondary | ICD-10-CM | POA: Diagnosis present

## 2021-12-01 LAB — CBC
HCT: 38.3 % (ref 36.0–46.0)
Hemoglobin: 13.2 g/dL (ref 12.0–15.0)
MCH: 29 pg (ref 26.0–34.0)
MCHC: 34.5 g/dL (ref 30.0–36.0)
MCV: 84.2 fL (ref 80.0–100.0)
Platelets: 296 10*3/uL (ref 150–400)
RBC: 4.55 MIL/uL (ref 3.87–5.11)
RDW: 12.7 % (ref 11.5–15.5)
WBC: 6.9 10*3/uL (ref 4.0–10.5)
nRBC: 0 % (ref 0.0–0.2)

## 2021-12-01 LAB — BASIC METABOLIC PANEL
Anion gap: 11 (ref 5–15)
BUN: 23 mg/dL — ABNORMAL HIGH (ref 6–20)
CO2: 24 mmol/L (ref 22–32)
Calcium: 9.5 mg/dL (ref 8.9–10.3)
Chloride: 103 mmol/L (ref 98–111)
Creatinine, Ser: 0.91 mg/dL (ref 0.44–1.00)
GFR, Estimated: >60 mL/min (ref >60)
Glucose, Bld: 107 mg/dL — ABNORMAL HIGH (ref 70–99)
Potassium: 3.4 mmol/L — ABNORMAL LOW (ref 3.5–5.1)
Sodium: 138 mmol/L (ref 135–145)

## 2021-12-01 LAB — TROPONIN I (HIGH SENSITIVITY): Troponin I (High Sensitivity): <2 ng/L (ref <18)

## 2021-12-01 MED ORDER — DIVALPROEX SODIUM 500 MG PO DR TAB: 500.0000 mg | DELAYED_RELEASE_TABLET | Freq: Every day | ORAL | 3 refills | Status: DC
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
## 2021-12-01 NOTE — Patient Instructions (Signed)
Continue with Depakote 500 mg twice daily (1 year supply give)  Follow up with PCP regarding your breathing problems  Return in 6 months or sooner if worse.

## 2021-12-01 NOTE — ED Triage Notes (Signed)
Pt presents to the ED with chest tightness and SHOB that started today around 2000. States that she does have a hx of anxiety and states that she took clonazepam at 2030. Reports it helping with the "panic of not being able to breath, but not the breathing itself". Pt Ambulatory to triage room without acute distress. Appears to be anxious at time of triage. Reports tingling in BUE's and tightness in her chest. Pt A&Ox4 at time of triage.   Pt does have a hx of drug use. States that she may have some amphetamines in her system, but denies drug use today. Is on suboxone for heroin detox.

## 2021-12-02 ENCOUNTER — Emergency Department (HOSPITAL_BASED_OUTPATIENT_CLINIC_OR_DEPARTMENT_OTHER)
Admission: EM | Admit: 2021-12-02 | Discharge: 2021-12-02 | Disposition: A | Discharge status: Home / Self Care | Payer: Medicaid Other | Attending: Emergency Medicine | Admitting: Emergency Medicine

## 2021-12-02 DIAGNOSIS — J4520 Mild intermittent asthma, uncomplicated: Secondary | ICD-10-CM

## 2021-12-02 DIAGNOSIS — R0789 Other chest pain: Secondary | ICD-10-CM

## 2021-12-02 DIAGNOSIS — F419 Anxiety disorder, unspecified: Secondary | ICD-10-CM

## 2021-12-02 DIAGNOSIS — F1510 Other stimulant abuse, uncomplicated: Secondary | ICD-10-CM

## 2021-12-02 LAB — RAPID URINE DRUG SCREEN, HOSP PERFORMED
Amphetamines: POSITIVE — AB
Barbiturates: NOT DETECTED
Benzodiazepines: POSITIVE — AB
Cocaine: NOT DETECTED
Opiates: NOT DETECTED
Tetrahydrocannabinol: NOT DETECTED

## 2021-12-02 LAB — D-DIMER, QUANTITATIVE: D-Dimer, Quant: <0.27 ug/mL-FEU (ref 0.00–0.50)

## 2021-12-02 LAB — PREGNANCY, URINE: Preg Test, Ur: NEGATIVE

## 2021-12-02 MED ORDER — SODIUM CHLORIDE 0.9 % IV BOLUS
1000.0000 mL | Freq: Once | INTRAVENOUS | Status: AC
  Administered 2021-12-02: 1000 mL via INTRAVENOUS

## 2021-12-02 MED ORDER — LORAZEPAM 1 MG PO TABS
2.0000 mg | ORAL_TABLET | Freq: Once | ORAL | Status: AC
  Administered 2021-12-02: 2 mg via ORAL
  Filled 2021-12-02: qty 2

## 2021-12-02 MED ORDER — IPRATROPIUM-ALBUTEROL 0.5-2.5 (3) MG/3ML IN SOLN
3.0000 mL | Freq: Once | RESPIRATORY_TRACT | Status: AC
  Administered 2021-12-02: 3 mL via RESPIRATORY_TRACT
  Filled 2021-12-02: qty 3

## 2021-12-02 MED ORDER — ALBUTEROL SULFATE HFA 108 (90 BASE) MCG/ACT IN AERS
1.0000 | INHALATION_SPRAY | Freq: Four times a day (QID) | RESPIRATORY_TRACT | Status: DC
  Administered 2021-12-02: 2 via RESPIRATORY_TRACT
  Filled 2021-12-02: qty 6.7

## 2021-12-02 NOTE — ED Provider Notes (Signed)
[facility]   CSN: [id number] Arrival date & time: 12/01/21  2238     History  Chief Complaint  Patient presents with   Chest Pain   Shortness of Breath    [name] is a 41 y.o. female.  Patient as above with significant medical history as below, including asthma, anxiety, bipolar, polysubstance abuse (prior heroin IV (clean x2 yrs), current meth snort), schizophrenia, seizure( non epileptic) who presents to the ED with complaint of dyspnea, cp, tachycardia. She has been having cp and dib over the last 3 weeks, chest tightness, not specified as pain. She feels like it is hard to take a deep breath, tightness across her chest. Checked her pulse ox at home and it was 100%, Hr was elevated though. No n/v, no fever or chills, no abd pain, no falls or head injury. No recent travel/sick contacts, no hx dvt or pe. No leg swelling.  Took klonipin pta w/o much improvement, feels like she is having a panic attack   Last use of meth was yesterday prior, last heroin was >12 mos ago.      Past Medical History:  Diagnosis Date   Anxiety    Asthma    Asthma    Bipolar disorder (HCC)    Complication of anesthesia    " I have a hard time waking & I cry "   Depression    panic disorder   Depression    History of blood transfusion 08/2017   MRSA (methicillin resistant staph aureus) culture positive 2013   Neuropathy    left leg   Polysubstance abuse (HCC) 08/27/2020   Seizures (HCC)    last one 08/29/17   Vaginal Pap smear, abnormal     Past Surgical History:  Procedure Laterality Date   ADENOIDECTOMY     FACIAL LACERATIONS REPAIR  08/30/2017       FEMUR IM NAIL Left 08/30/2017   Procedure: INTRAMEDULLARY (IM) NAIL FEMORAL;  Surgeon: Roby LoftsHaddix, Kevin P, MD;  Location: [facility] OR;  Service: Orthopedics;  Laterality: Left;   I & D EXTREMITY Left 08/30/2017   Procedure: IRRIGATION AND DEBRIDEMENT LEFT FEMUR AND TIBIA;  Surgeon: Roby LoftsHaddix, Kevin P, MD;   Location: [facility] OR;  Service: Orthopedics;  Laterality: Left;   I & D EXTREMITY Left 10/25/2017   Procedure: Irrigation and debridment and closure of wound left leg;  Surgeon: Roby LoftsHaddix, Kevin P, MD;  Location: [facility] OR;  Service: Orthopedics;  Laterality: Left;   I & D EXTREMITY Left 11/29/2017   Procedure: IRRIGATION AND DEBRIDEMENT EXTREMITY;  Surgeon: Roby LoftsHaddix, Kevin P, MD;  Location: [facility] OR;  Service: Orthopedics;  Laterality: Left;   OPEN REDUCTION INTERNAL FIXATION (ORIF) DISTAL RADIAL FRACTURE Right 09/01/2017   Procedure: OPEN REDUCTION INTERNAL FIXATION (ORIF) DISTAL RADIAL FRACTURE;  Surgeon: Roby LoftsHaddix, Kevin P, MD;  Location: [facility];  Service: Orthopedics;  Laterality: Right;   ORIF CLAVICULAR FRACTURE Left 09/01/2017   Procedure: OPEN REDUCTION INTERNAL FIXATION (ORIF) CLAVICULAR FRACTURE;  Surgeon: Roby LoftsHaddix, Kevin P, MD;  Location: [facility];  Service: Orthopedics;  Laterality: Left;   ORIF PELVIC FRACTURE     OPEN REDUCTION INTERNAL FIXATION (ORIF) CLAVICULAR FRACTURE   ORIF PELVIC FRACTURE WITH PERCUTANEOUS SCREWS N/A 09/01/2017   Procedure: ORIF PELVIC FRACTURE WITH PERCUTANEOUS SCREWS;  Surgeon: Roby LoftsHaddix, Kevin P, MD;  Location: [facility];  Service: Orthopedics;  Laterality: N/A;   TIBIA IM NAIL INSERTION Left 08/30/2017   Procedure: INTRAMEDULLARY (IM) NAIL TIBIAL;  Surgeon: Roby LoftsHaddix, Kevin P,  MD;  Location: [facility];  Service: Orthopedics;  Laterality: Left;   TIBIA IM NAIL INSERTION Left 11/14/2018   Procedure: EXCHANGE NAILING OF LEFT TIBIAL NONUNION;  Surgeon: Roby Lofts, MD;  Location: [facility];  Service: Orthopedics;  Laterality: Left;   TONSILLECTOMY     TUBAL LIGATION       The history is provided by the patient. No language interpreter was used.  Chest Pain Associated symptoms: shortness of breath   Associated symptoms: no abdominal pain, no back pain, no cough, no dysphagia, no fever, no headache, no nausea and no palpitations   Shortness of Breath Associated symptoms: no abdominal pain, no chest pain,  no cough, no fever, no headaches and no rash        Home Medications Prior to Admission medications   Medication Sig Start Date End Date Taking? Authorizing Provider  albendazole (ALBENZA) 200 MG tablet Take 400 mg once on an empty stomach and repeat in 2 weeks. 10/09/21   Jacalyn Lefevre, MD  Cholecalciferol (D3 HIGH POTENCY) 125 MCG (5000 UT) capsule Take 1 capsule (5,000 Units total) by mouth daily. 11/16/18   West Bali, PA-C  clonazePAM (KLONOPIN) 0.5 MG tablet Take 1 tablet (0.5 mg total) by mouth 2 (two) times daily as needed (anxiety). Use your home medication. Do not take this medication unless absolutely necessary. It is recommended that yo utry to discontinue taking this altogether. Patient taking differently: Take 0.5 mg by mouth 3 (three) times daily. 08/31/20   Laveda Abbe, NP  divalproex (DEPAKOTE) 500 MG DR tablet Take 1 tablet (500 mg total) by mouth daily. 12/01/21 11/26/22  Windell Norfolk, MD  famotidine (PEPCID) 40 MG tablet Take 40 mg by mouth in the morning and at bedtime. 06/18/21   [provider]  ferrous sulfate 325 (65 FE) MG tablet Take 1 tablet (325 mg total) by mouth daily. 10/09/21   Jacalyn Lefevre, MD  gabapentin (NEURONTIN) 300 MG capsule Take 1 capsule (300 mg total) by mouth 3 (three) times daily. Patient taking differently: Take 300 mg by mouth 3 (three) times daily as needed (pain). 08/31/20   Laveda Abbe, NP  promethazine (PHENERGAN) 25 MG suppository Place 1 suppository (25 mg total) rectally every 6 (six) hours as needed for nausea or vomiting. 07/17/21   Melene Plan, DO  QUEtiapine (SEROQUEL) 50 MG tablet Take 1 tablet (50 mg total) by mouth at bedtime. Patient taking differently: Take 100 mg by mouth at bedtime. 08/31/20   Laveda Abbe, NP  SUBOXONE 8-2 MG FILM Place 1 Film under the tongue 3 (three) times daily. 07/31/20   [provider]  SUMAtriptan (IMITREX) 100 MG tablet Take 100 mg by mouth every 4 (four) hours  as needed for headache or migraine. 07/01/21   [provider]  VRAYLAR 1.5 MG capsule Take 1.5 mg by mouth daily. 08/11/21   [provider]      Allergies    Vancomycin, Azithromycin, Erythromycin, Sudafed [pseudoephedrine hcl], Tramadol, Vancomycin, and Zithromax [azithromycin]    Review of Systems   Review of Systems  Constitutional:  Negative for activity change and fever.  HENT:  Negative for facial swelling and trouble swallowing.   Eyes:  Negative for discharge and redness.  Respiratory:  Positive for chest tightness and shortness of breath. Negative for cough.   Cardiovascular:  Negative for chest pain and palpitations.  Gastrointestinal:  Negative for abdominal pain and nausea.  Genitourinary:  Negative for dysuria and flank pain.  Musculoskeletal:  Negative for back pain and gait problem.  Skin:  Negative for pallor and rash.  Neurological:  Negative for syncope and headaches.  Psychiatric/Behavioral:  The patient is nervous/anxious.     Physical Exam Updated Vital Signs BP (!) 135/112   Pulse (!) 114   Temp 98.1 F (36.7 C) (Oral)   Resp (!) 24   Ht 5\' 8"  (1.727 m)   Wt 63 kg   LMP 11/28/2021   SpO2 100%   BMI 21.13 kg/m  Physical Exam Vitals and nursing note reviewed.  Constitutional:      General: She is not in acute distress.    Appearance: Normal appearance. She is well-developed. She is not ill-appearing.  HENT:     Head: Normocephalic and atraumatic.     Right Ear: External ear normal.     Left Ear: External ear normal.     Nose: Nose normal.     Mouth/Throat:     Mouth: Mucous membranes are moist.  Eyes:     General: No scleral icterus.       Right eye: No discharge.        Left eye: No discharge.  Cardiovascular:     Rate and Rhythm: Regular rhythm. Tachycardia present.     Pulses: Normal pulses.     Heart sounds: Normal heart sounds. No murmur heard.    No S3 or S4 sounds.  Pulmonary:     Effort: Pulmonary effort is normal.  No respiratory distress.     Breath sounds: Normal breath sounds.  Abdominal:     General: Abdomen is flat.     Tenderness: There is no abdominal tenderness.  Musculoskeletal:        General: Normal range of motion.     Cervical back: Normal range of motion.     Right lower leg: No edema.     Left lower leg: No edema.  Skin:    General: Skin is warm and dry.     Capillary Refill: Capillary refill takes less than 2 seconds.  Neurological:     Mental Status: She is alert and oriented to person, place, and time.     GCS: GCS eye subscore is 4. GCS verbal subscore is 5. GCS motor subscore is 6.     Cranial Nerves: Cranial nerves 2-12 are intact. No dysarthria.     Sensory: Sensation is intact.     Motor: Motor function is intact. No tremor.     Coordination: Coordination is intact.     Gait: Gait is intact.  Psychiatric:        Mood and Affect: Mood normal.        Behavior: Behavior normal.     ED Results / Procedures / Treatments   Labs (all labs ordered are listed, but only abnormal results are displayed) Labs Reviewed  BASIC METABOLIC PANEL - Abnormal; Notable for the following components:      Result Value   Potassium 3.4 (*)    Glucose, Bld 107 (*)    BUN 23 (*)    All other components within normal limits  RAPID URINE DRUG SCREEN, HOSP PERFORMED - Abnormal; Notable for the following components:   Benzodiazepines POSITIVE (*)    Amphetamines POSITIVE (*)    All other components within normal limits  CBC  PREGNANCY, URINE  D-DIMER, QUANTITATIVE  TSH  TROPONIN I (HIGH SENSITIVITY)  TROPONIN I (HIGH SENSITIVITY)    EKG EKG Interpretation  Date/Time:  Wednesday December 01 2021  22:47:37 EDT Ventricular Rate:  136 PR Interval:  88 QRS Duration: 72 QT Interval:  302 QTC Calculation: 454 R Axis:   90 Text Interpretation: Sinus tachycardia with short PR Rightward axis ST & T wave abnormality, consider inferior ischemia Abnormal ECG When compared with ECG of  12-Jul-2021 15:16, No significant change was found HEART RATE INCREASED SINCE prior similar morphology as prior no stemi Confirmed by Tanda Rockers ([id number]) on 12/02/2021 2:10:04 AM  Radiology DG Chest 2 View  Result Date: 12/01/2021 CLINICAL DATA:  Shortness of breath.  Chest tightness. EXAM: CHEST - 2 VIEW COMPARISON:  Radiograph 08/25/2020 FINDINGS: Borderline hyperinflation.The cardiomediastinal contours are normal. Pulmonary vasculature is normal. No consolidation, pleural effusion, or pneumothorax. Plate and screw fixation of the left clavicle. No acute osseous abnormalities are seen. Overlying telemetry leads. IMPRESSION: Borderline hyperinflation without localizing abnormality. Electronically Signed   By: Narda Rutherford M.D.   On: 12/01/2021 23:11    Procedures Procedures    Medications Ordered in ED Medications  albuterol (VENTOLIN HFA) 108 (90 Base) MCG/ACT inhaler 1-2 puff (2 puffs Inhalation Provided for home use 12/02/21 0318)  ipratropium-albuterol (DUONEB) 0.5-2.5 (3) MG/3ML nebulizer solution 3 mL (3 mLs Nebulization Given 12/02/21 0118)  sodium chloride 0.9 % bolus 1,000 mL (0 mLs Intravenous Stopped 12/02/21 0259)  LORazepam (ATIVAN) tablet 2 mg (2 mg Oral Given 12/02/21 0142)    ED Course/ Medical Decision Making/ A&P                           Medical Decision Making Amount and/or Complexity of Data Reviewed Labs: ordered. Radiology: ordered.  Risk Prescription drug management.   This patient presents to the ED with chief complaint(s) of chest tightness, dib, fast hr, anxiety with pertinent past medical history of meth use, schizophrenia, polysubstance abuse which further complicates the presenting complaint. The complaint involves an extensive differential diagnosis and also carries with it a high risk of complications and morbidity.  Serious etiology considered  The differential diagnosis includes  Differential includes all life-threatening causes for chest pain. This  includes but is not exclusive to acute coronary syndrome, aortic dissection, pulmonary embolism, cardiac tamponade, community-acquired pneumonia, pericarditis, musculoskeletal chest wall pain, etc.    The initial plan is to labs,cxr, ivf. Will also give duoneb, ativan, ivf   Additional history obtained: Additional history obtained from spouse Records reviewed previous admission documents and prior ed visits, prior labs/imaging, home meds   Independent labs interpretation:  The following labs were independently interpreted:  Low risk Wells score, D-dimer is negative.  PE is unlikely. UDS positive for amphetamines and benzos, she was given Ativan in the ED. BMP with mild hyponatremia.  Otherwise unremarkable. CBC stable Pregnancy negative  Independent visualization of imaging: - I independently visualized the following imaging with scope of interpretation limited to determining acute life threatening conditions related to emergency care: Chest x-ray 2 view, which revealed no acute process  Treatment and Reassessment: As above. Symptoms greatly improved following nebulized breathing treatment and Ativan.  Heart rate has improved.  Patient feels her respiratory status has returned to baseline.  No longer having sensation of dyspnea.  Her anxiety is also improved following benzo.  Consultation: - Consulted or discussed management/test interpretation w/ external professional: N/A  Consideration for admission or further workup: Considered admission however feel patient benefit from outpatient management of her symptoms, likely secondary to mild asthma exacerbation versus methamphetamine use.  Pain appears atypical in nature.  Low  risk HEAR score, troponin high-sensitivity negative x 1, chest x-ray negative, symptom onset greater than 4 hours ago.  Favor atypical chest pain.  Potentially provoked by methamphetamine versus asthma.  Advised patient refrain for methamphetamine use.  Given  outpatient resources regarding this.  Given albuterol inhaler in the ED.  Advised patient follow-up with her psychiatrist regarding anxiolytic.   Social Determinants of health: Polysubstance abuse with last methamphetamine use in past 24 hours.  No heroin in greater than 2 years  Counseled patient for approximately 3 minutes regarding smoking cessation. Discussed risks of smoking and how they applied and affected their visit here today. Patient not ready to quit at this time, however will follow up with their primary doctor when they are.   CPT code: 02409: intermediate counseling for smoking cessation  The patient improved significantly and was discharged in stable condition. Detailed discussions were had with the patient regarding current findings, and need for close f/u with PCP or on call doctor. The patient has been instructed to return immediately if the symptoms worsen in any way for re-evaluation. Patient verbalized understanding and is in agreement with current care plan. All questions answered prior to discharge.           Final Clinical Impression(s) / ED Diagnoses Final diagnoses:  Methamphetamine use (HCC)  Mild intermittent asthma, unspecified whether complicated  Atypical chest pain  Anxiousness    Rx / DC Orders ED Discharge Orders     None         Sloan Leiter, DO 12/02/21 [id number]

## 2021-12-02 NOTE — Discharge Instructions (Addendum)
It was a pleasure caring for you today in the emergency department. ° °Please return to the emergency department for any worsening or worrisome symptoms. ° ° °

## 2022-06-09 ENCOUNTER — Encounter: Payer: Self-pay | Admitting: Neurology

## 2022-06-09 ENCOUNTER — Ambulatory Visit: Payer: Medicaid Other | Admitting: Neurology

## 2022-06-26 ENCOUNTER — Encounter (HOSPITAL_BASED_OUTPATIENT_CLINIC_OR_DEPARTMENT_OTHER): Payer: Self-pay | Admitting: Emergency Medicine

## 2022-06-26 ENCOUNTER — Emergency Department (HOSPITAL_BASED_OUTPATIENT_CLINIC_OR_DEPARTMENT_OTHER): Payer: Medicaid Other | Admitting: Radiology

## 2022-06-26 ENCOUNTER — Emergency Department (HOSPITAL_BASED_OUTPATIENT_CLINIC_OR_DEPARTMENT_OTHER): Payer: Medicaid Other

## 2022-06-26 ENCOUNTER — Other Ambulatory Visit: Payer: Self-pay

## 2022-06-26 ENCOUNTER — Emergency Department (HOSPITAL_BASED_OUTPATIENT_CLINIC_OR_DEPARTMENT_OTHER)
Admission: EM | Admit: 2022-06-26 | Discharge: 2022-06-26 | Disposition: A | Discharge status: Home / Self Care | Payer: Medicaid Other | Attending: Emergency Medicine | Admitting: Emergency Medicine

## 2022-06-26 DIAGNOSIS — J45909 Unspecified asthma, uncomplicated: Secondary | ICD-10-CM | POA: Insufficient documentation

## 2022-06-26 DIAGNOSIS — R1011 Right upper quadrant pain: Secondary | ICD-10-CM | POA: Diagnosis not present

## 2022-06-26 LAB — CBC WITH DIFFERENTIAL/PLATELET
Abs Immature Granulocytes: 0.02 10*3/uL (ref 0.00–0.07)
Basophils Absolute: 0 10*3/uL (ref 0.0–0.1)
Basophils Relative: 0 %
Eosinophils Absolute: 0.5 10*3/uL (ref 0.0–0.5)
Eosinophils Relative: 6 %
HCT: 37.3 % (ref 36.0–46.0)
Hemoglobin: 12.4 g/dL (ref 12.0–15.0)
Immature Granulocytes: 0 %
Lymphocytes Relative: 20 %
Lymphs Abs: 1.6 10*3/uL (ref 0.7–4.0)
MCH: 28.1 pg (ref 26.0–34.0)
MCHC: 33.2 g/dL (ref 30.0–36.0)
MCV: 84.4 fL (ref 80.0–100.0)
Monocytes Absolute: 0.5 10*3/uL (ref 0.1–1.0)
Monocytes Relative: 6 %
Neutro Abs: 5.6 10*3/uL (ref 1.7–7.7)
Neutrophils Relative %: 68 %
Platelets: 289 10*3/uL (ref 150–400)
RBC: 4.42 MIL/uL (ref 3.87–5.11)
RDW: 14 % (ref 11.5–15.5)
WBC: 8.3 10*3/uL (ref 4.0–10.5)
nRBC: 0 % (ref 0.0–0.2)

## 2022-06-26 LAB — COMPREHENSIVE METABOLIC PANEL
ALT: 13 U/L (ref 0–44)
AST: 12 U/L — ABNORMAL LOW (ref 15–41)
Albumin: 4.1 g/dL (ref 3.5–5.0)
Alkaline Phosphatase: 28 U/L — ABNORMAL LOW (ref 38–126)
Anion gap: 7 (ref 5–15)
BUN: 21 mg/dL — ABNORMAL HIGH (ref 6–20)
CO2: 24 mmol/L (ref 22–32)
Calcium: 9.4 mg/dL (ref 8.9–10.3)
Chloride: 106 mmol/L (ref 98–111)
Creatinine, Ser: 0.62 mg/dL (ref 0.44–1.00)
GFR, Estimated: >60 mL/min (ref >60)
Glucose, Bld: 96 mg/dL (ref 70–99)
Potassium: 4.1 mmol/L (ref 3.5–5.1)
Sodium: 137 mmol/L (ref 135–145)
Total Bilirubin: 0.2 mg/dL — ABNORMAL LOW (ref 0.3–1.2)
Total Protein: 7.7 g/dL (ref 6.5–8.1)

## 2022-06-26 LAB — HCG, QUANTITATIVE, PREGNANCY: hCG, Beta Chain, Quant, S: <1 m[IU]/mL (ref <5)

## 2022-06-26 LAB — LIPASE, BLOOD: Lipase: 17 U/L (ref 11–51)

## 2022-06-26 MED ORDER — ALUM & MAG HYDROXIDE-SIMETH 200-200-20 MG/5ML PO SUSP
30.0000 mL | Freq: Once | ORAL | Status: AC
  Administered 2022-06-26: 30 mL via ORAL
  Filled 2022-06-26: qty 30

## 2022-06-26 MED ORDER — FAMOTIDINE IN NACL 20-0.9 MG/50ML-% IV SOLN
20.0000 mg | Freq: Once | INTRAVENOUS | Status: AC
  Administered 2022-06-26: 20 mg via INTRAVENOUS
  Filled 2022-06-26: qty 50

## 2022-06-26 MED ORDER — FAMOTIDINE 40 MG PO TABS: 40.0000 mg | ORAL_TABLET | Freq: Every day | ORAL | 0 refills | Status: AC

## 2022-06-26 MED ORDER — MAALOX MAX 400-400-40 MG/5ML PO SUSP: 15.0000 mL | Freq: Four times a day (QID) | ORAL | 0 refills | Status: DC | PRN

## 2022-06-26 MED ORDER — LACTATED RINGERS IV BOLUS
1000.0000 mL | Freq: Once | INTRAVENOUS | Status: AC
  Administered 2022-06-26: 1000 mL via INTRAVENOUS

## 2022-06-26 MED ORDER — PANTOPRAZOLE SODIUM 40 MG PO TBEC: 40.0000 mg | DELAYED_RELEASE_TABLET | Freq: Every day | ORAL | 0 refills | Status: DC

## 2022-06-26 MED ORDER — LIDOCAINE VISCOUS HCL 2 % MT SOLN
15.0000 mL | Freq: Once | OROMUCOSAL | Status: AC
  Administered 2022-06-26: 15 mL via ORAL
  Filled 2022-06-26: qty 15

## 2022-06-26 MED ORDER — IOHEXOL 300 MG/ML  SOLN
100.0000 mL | Freq: Once | INTRAMUSCULAR | Status: AC | PRN
  Administered 2022-06-26: 80 mL via INTRAVENOUS

## 2022-06-26 NOTE — ED Triage Notes (Signed)
Stomach feels like it is on fire for 4 days, been taking pepto and pepcid with no relief.

## 2022-06-26 NOTE — ED Notes (Signed)
Discharge paperwork given and verbally understood. 

## 2022-06-26 NOTE — ED Provider Notes (Signed)
[facility] Provider Note   CSN: [id number] Arrival date & time: 06/26/22  [id number]     History  No chief complaint on file.   [name] is a 42 y.o. female.  HPI   42 year old female with medical history significant for asthma, depression, anxiety, bipolar disorder, polysubstance abuse, GERD who presents to the emergency department with abdominal pain.  The patient endorses right upper quadrant and epigastric abdominal pain that is burning in quality.  It is been ongoing for the past 4 days.  She endorses nausea, denies any vomiting.  No lower abdominal pain, diarrhea, dysuria, no fever or chills.  She has had decreased oral intake as a result.  Home Medications Prior to Admission medications   Medication Sig Start Date End Date Taking? Authorizing Provider  alum & mag hydroxide-simeth (MAALOX MAX) 400-400-40 MG/5ML suspension Take 15 mLs by mouth every 6 (six) hours as needed for indigestion. 06/26/22  Yes Regan Lemming, MD  pantoprazole (PROTONIX) 40 MG tablet Take 1 tablet (40 mg total) by mouth daily. 06/26/22 07/26/22 Yes Regan Lemming, MD  albendazole (ALBENZA) 200 MG tablet Take 400 mg once on an empty stomach and repeat in 2 weeks. 10/09/21   Isla Pence, MD  Cholecalciferol (D3 HIGH POTENCY) 125 MCG (5000 UT) capsule Take 1 capsule (5,000 Units total) by mouth daily. 11/16/18   Corinne Ports, PA-C  clonazePAM (KLONOPIN) 0.5 MG tablet Take 1 tablet (0.5 mg total) by mouth 2 (two) times daily as needed (anxiety). Use your home medication. Do not take this medication unless absolutely necessary. It is recommended that yo utry to discontinue taking this altogether. Patient taking differently: Take 0.5 mg by mouth 3 (three) times daily. 08/31/20   Ethelene Hal, NP  divalproex (DEPAKOTE) 500 MG DR tablet Take 1 tablet (500 mg total) by mouth daily. 12/01/21 11/26/22  Alric Ran, MD  famotidine (PEPCID) 40 MG tablet Take 1 tablet (40  mg total) by mouth daily. 06/26/22 07/26/22  Regan Lemming, MD  ferrous sulfate 325 (65 FE) MG tablet Take 1 tablet (325 mg total) by mouth daily. 10/09/21   Isla Pence, MD  gabapentin (NEURONTIN) 300 MG capsule Take 1 capsule (300 mg total) by mouth 3 (three) times daily. Patient taking differently: Take 300 mg by mouth 3 (three) times daily as needed (pain). 08/31/20   Ethelene Hal, NP  promethazine (PHENERGAN) 25 MG suppository Place 1 suppository (25 mg total) rectally every 6 (six) hours as needed for nausea or vomiting. 07/17/21   Deno Etienne, DO  QUEtiapine (SEROQUEL) 50 MG tablet Take 1 tablet (50 mg total) by mouth at bedtime. Patient taking differently: Take 100 mg by mouth at bedtime. 08/31/20   Ethelene Hal, NP  SUBOXONE 8-2 MG FILM Place 1 Film under the tongue 3 (three) times daily. 07/31/20   [provider]  SUMAtriptan (IMITREX) 100 MG tablet Take 100 mg by mouth every 4 (four) hours as needed for headache or migraine. 07/01/21   [provider]  VRAYLAR 1.5 MG capsule Take 1.5 mg by mouth daily. 08/11/21   [provider]      Allergies    Vancomycin, Azithromycin, Erythromycin, Sudafed [pseudoephedrine hcl], Tramadol, Vancomycin, and Zithromax [azithromycin]    Review of Systems   Review of Systems  All other systems reviewed and are negative.   Physical Exam Updated Vital Signs BP 111/74   Pulse 85   Temp 98.5 F (36.9 C) (Oral)  Resp 14   SpO2 100%  Physical Exam Vitals and nursing note reviewed.  Constitutional:      General: She is not in acute distress.    Appearance: She is well-developed.  HENT:     Head: Normocephalic and atraumatic.  Eyes:     Conjunctiva/sclera: Conjunctivae normal.  Cardiovascular:     Rate and Rhythm: Normal rate and regular rhythm.  Pulmonary:     Effort: Pulmonary effort is normal. No respiratory distress.     Breath sounds: Normal breath sounds.  Abdominal:     Palpations: Abdomen is  soft.     Tenderness: There is abdominal tenderness in the right upper quadrant and epigastric area. There is no guarding or rebound. Negative signs include Murphy's sign and McBurney's sign.  Musculoskeletal:        General: No swelling.     Cervical back: Neck supple.  Skin:    General: Skin is warm and dry.     Capillary Refill: Capillary refill takes less than 2 seconds.  Neurological:     Mental Status: She is alert.  Psychiatric:        Mood and Affect: Mood normal.     ED Results / Procedures / Treatments   Labs (all labs ordered are listed, but only abnormal results are displayed) Labs Reviewed  COMPREHENSIVE METABOLIC PANEL - Abnormal; Notable for the following components:      Result Value   BUN 21 (*)    AST 12 (*)    Alkaline Phosphatase 28 (*)    Total Bilirubin 0.2 (*)    All other components within normal limits  CBC WITH DIFFERENTIAL/PLATELET  LIPASE, BLOOD  HCG, QUANTITATIVE, PREGNANCY    EKG None  Radiology CT ABDOMEN PELVIS W CONTRAST  Result Date: 06/26/2022 CLINICAL DATA:  Abdominal pain EXAM: CT ABDOMEN AND PELVIS WITH CONTRAST TECHNIQUE: Multidetector CT imaging of the abdomen and pelvis was performed using the standard protocol following bolus administration of intravenous contrast. RADIATION DOSE REDUCTION: This exam was performed according to the departmental dose-optimization program which includes automated exposure control, adjustment of the mA and/or kV according to patient size and/or use of iterative reconstruction technique. CONTRAST:  57m OMNIPAQUE IOHEXOL 300 MG/ML  SOLN COMPARISON:  CT examination dated July 15, 2021 FINDINGS: Lower chest: No acute abnormality. Hepatobiliary: No focal liver abnormality is seen. No gallstones, gallbladder wall thickening, or biliary dilatation. Pancreas: Unremarkable. No pancreatic ductal dilatation or surrounding inflammatory changes. Spleen: Normal in size without focal abnormality. Adrenals/Urinary Tract:  Chronic right adrenal calcifications. Left adrenal is normal in size evidence of nephrolithiasis or hydronephrosis. Symmetric perfusion of bilateral kidneys. Urinary bladder is unremarkable. Stomach/Bowel: Stomach is within normal limits. Appendix appears normal. No evidence of bowel wall thickening, distention, or inflammatory changes. Moderate amount of retained colorectal stool suggesting constipation. Vascular/Lymphatic: No significant vascular findings are present. No enlarged abdominal or pelvic lymph nodes. Reproductive: Uterus and bilateral adnexa are unremarkable. Other: No abdominal wall hernia or abnormality. No abdominopelvic ascites. Musculoskeletal: Hardware in the bilateral superior pubic rami as well as left hip intramedullary nail. IMPRESSION: 1. Moderate amount of retained colorectal stool suggesting constipation. No evidence of bowel obstruction. 2. No evidence of nephrolithiasis or hydronephrosis. 3. Chronic right adrenal calcifications. Electronically Signed   By: IKeane PoliceD.O.   On: 06/26/2022 14:57   DG Abdomen 1 View  Result Date: 06/26/2022 CLINICAL DATA:  Abdominal pain.  Gallstones. EXAM: ABDOMEN - 1 VIEW COMPARISON:  CT 07/15/2021.  Ultrasound 06/26/2022 earlier  FINDINGS: Overlapping cardiac leads. There is gas seen along nondilated loops of small and large bowel diffusely. Scattered colonic stool. Air-fluid level along the stomach and along few loops of bowel in the midabdomen. No obvious free air seen beneath the diaphragm on the upright view. Of note the caudal pelvis is clipped off the edge of the film. There are presumed orthopedic screws identified at the edge of the imaging field along the pelvis. IMPRESSION: Nonspecific bowel gas pattern with scattered air-fluid levels. Additional cross-sectional imaging study as clinically directed Electronically Signed   By: Jill Side M.D.   On: 06/26/2022 12:11   US Abdomen Limited RUQ (LIVER/GB)  Result Date: 06/26/2022 CLINICAL  DATA:  Right upper quadrant pain for 4 days EXAM: ULTRASOUND ABDOMEN LIMITED RIGHT UPPER QUADRANT COMPARISON:  None Available. FINDINGS: Gallbladder: A single 2 cm stone is identified in the gallbladder. The gallbladder wall measures 3 mm which is borderline in thickness. A positive Murphy's sign is reported. Common bile duct: Diameter: 3 mm Liver: No focal lesion identified. Within normal limits in parenchymal echogenicity. Portal vein is patent on color Doppler imaging with normal direction of blood flow towards the liver. Other: None. IMPRESSION: 1. A single 2 cm stone is identified in the gallbladder. Gallbladder wall thickness is borderline. A positive Murphy's sign is reported. The constellation of findings suggest the possibility of acute cholecystitis in the appropriate clinical setting. If the clinical picture is ambiguous, a HIDA scan could further evaluate. 2. No other abnormalities. Electronically Signed   By: Dorise Bullion III M.D.   On: 06/26/2022 11:15    Procedures Procedures    Medications Ordered in ED Medications  alum & mag hydroxide-simeth (MAALOX/MYLANTA) 200-200-20 MG/5ML suspension 30 mL (30 mLs Oral Given 06/26/22 1039)    And  lidocaine (XYLOCAINE) 2 % viscous mouth solution 15 mL (15 mLs Oral Given 06/26/22 1039)  lactated ringers bolus 1,000 mL (0 mLs Intravenous Stopped 06/26/22 1112)  famotidine (PEPCID) IVPB 20 mg premix (0 mg Intravenous Stopped 06/26/22 1113)  iohexol (OMNIPAQUE) 300 MG/ML solution 100 mL (80 mLs Intravenous Contrast Given 06/26/22 1350)    ED Course/ Medical Decision Making/ A&P                             Medical Decision Making Amount and/or Complexity of Data Reviewed Labs: ordered. Radiology: ordered.  Risk OTC drugs. Prescription drug management. Decision regarding hospitalization.    42 year old female with medical history significant for asthma, depression, anxiety, bipolar disorder, polysubstance abuse, GERD who presents to the  emergency department with abdominal pain.  The patient endorses right upper quadrant and epigastric abdominal pain that is burning in quality.  It is been ongoing for the past 4 days.  She endorses nausea, denies any vomiting.  No lower abdominal pain, diarrhea, dysuria, no fever or chills.  She has had decreased oral intake as a result.  On arrival, the was afebrile, not tachycardic or tachypneic, saturating well on room air, BP 140/78, sinus rhythm noted on cardiac telemetry.  Patient with right upper quadrant and epigastric tenderness to palpation, no rebound or guarding.  Negative Murphy sign noted on exam.  Differential diagnosis includes cholelithiasis/cholecystitis, pancreatitis, peptic ulcer disease, perforated ulcer, small bowel obstruction, constipation, GERD/gastritis.  IV access was obtained and the patient was administered IV Pepcid, and IV fluid bolus.  She tolerated viscous lidocaine and Maalox.  A laboratory evaluation was significant for CMP that was unremarkable with  normal biliary function and liver function, normal renal function, lipase normal.  hCG normal, CBC without leukocytosis or anemia.  Ischial x-ray of the abdomen revealed no free air, no evidence of perforation.  Nonobstructive bowel gas changes.  Given the patient's right upper quadrant tenderness, an ultrasound was performed which was concerning for acute cholecystitis: IMPRESSION:  1. A single 2 cm stone is identified in the gallbladder. Gallbladder  wall thickness is borderline. A positive Murphy's sign is reported.  The constellation of findings suggest the possibility of acute  cholecystitis in the appropriate clinical setting. If the clinical  picture is ambiguous, a HIDA scan could further evaluate.  2. No other abnormalities.    I have lower concern for cholecystitis based on the patient's labs and my exam however given the ultrasound finding, Dr. Dema Severin of general surgery was contacted.  He recommended a CT  of the abdomen pelvis with contrast for further evaluation.  CT Abdomen Pelvis W: IMPRESSION:  1. Moderate amount of retained colorectal stool suggesting  constipation. No evidence of bowel obstruction.  2. No evidence of nephrolithiasis or hydronephrosis.  3. Chronic right adrenal calcifications.   Following the CT scan, the patient had a large bowel movement in the emergency department.  Low concern for constipation.  No evidence of obstruction was noted.  There was no evidence of cholecystitis on CT imaging.  I called back Dr. Dema Severin who and explained my low concern for cholecystitis at this time, he was in agreement.    On repeat evaluation, the patient was pain-free, her right upper quadrant was nontender to palpation.  She had tolerated oral intake with both fluids and half of a hotdog in the emergency department without discomfort.  No vomiting.  I recommended that the patient follow-up outpatient with gastroenterology for consideration for EGD in the setting of concern for peptic ulcer disease.  Referral placed to GI.  Will place the patient on both Protonix and advised her to continue taking her Pepcid.  Will also recommend Maalox as well.  Return precautions provided in the event of concern for cholecystitis.  Stable for discharge.   Final Clinical Impression(s) / ED Diagnoses Final diagnoses:  RUQ pain    Rx / DC Orders ED Discharge Orders          Ordered    Ambulatory referral to Gastroenterology        06/26/22 1519    pantoprazole (PROTONIX) 40 MG tablet  Daily        06/26/22 1520    famotidine (PEPCID) 40 MG tablet  Daily        06/26/22 1520    alum & mag hydroxide-simeth (MAALOX MAX) [id number] MG/5ML suspension  Every 6 hours PRN        06/26/22 1520              Regan Lemming, MD 06/26/22 1521

## 2022-06-26 NOTE — Discharge Instructions (Addendum)
Your ultrasound initially was concerning for infection in your gallbladder however your physical exam, laboratory evaluation and CT imaging points away from this.  Your symptoms are most consistent potentially with peptic ulcer disease.  Recommend you take Pepcid and Protonix and follow-up with gastroenterology as you could benefit from endoscopy outpatient to further evaluate.

## 2022-09-26 DIAGNOSIS — Z0271 Encounter for disability determination: Secondary | ICD-10-CM

## 2022-11-08 ENCOUNTER — Encounter: Payer: Self-pay | Admitting: Neurology

## 2022-11-08 ENCOUNTER — Ambulatory Visit (INDEPENDENT_AMBULATORY_CARE_PROVIDER_SITE_OTHER): Payer: MEDICAID | Admitting: Neurology

## 2022-11-08 VITALS — BP 120/88 | HR 60 | Ht 67.0 in | Wt 151.5 lb

## 2022-11-08 DIAGNOSIS — G40909 Epilepsy, unspecified, not intractable, without status epilepticus: Secondary | ICD-10-CM

## 2022-11-08 DIAGNOSIS — G43009 Migraine without aura, not intractable, without status migrainosus: Secondary | ICD-10-CM | POA: Diagnosis not present

## 2022-11-08 MED ORDER — DIVALPROEX SODIUM 500 MG PO DR TAB: 500.0000 mg | DELAYED_RELEASE_TABLET | Freq: Two times a day (BID) | ORAL | 3 refills | Status: AC
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
## 2022-11-08 NOTE — Patient Instructions (Signed)
Restart Depakote 500 mg twice daily  Continue your other medications  Trial of Nurtec for the headaches  Follow up in 6 months or sooner if worse

## 2023-05-16 ENCOUNTER — Telehealth: Payer: Self-pay | Admitting: Neurology

## 2023-05-16 NOTE — Telephone Encounter (Signed)
Appointment needed to be r/s

## 2023-05-17 ENCOUNTER — Ambulatory Visit: Payer: MEDICAID | Admitting: Neurology

## 2023-09-28 ENCOUNTER — Telehealth: Payer: Self-pay | Admitting: Neurology

## 2023-09-28 NOTE — Telephone Encounter (Signed)
 Appointment details confirmed

## 2023-11-16 ENCOUNTER — Ambulatory Visit: Payer: MEDICAID | Admitting: Neurology

## 2023-11-27 ENCOUNTER — Encounter: Payer: Self-pay | Admitting: Podiatry

## 2023-11-27 ENCOUNTER — Ambulatory Visit: Payer: MEDICAID | Admitting: Podiatry

## 2023-11-27 DIAGNOSIS — B353 Tinea pedis: Secondary | ICD-10-CM | POA: Diagnosis not present

## 2023-11-27 DIAGNOSIS — B351 Tinea unguium: Secondary | ICD-10-CM

## 2023-11-27 MED ORDER — CLOTRIMAZOLE-BETAMETHASONE 1-0.05 % EX CREA: 1.0000 | TOPICAL_CREAM | Freq: Every day | CUTANEOUS | 0 refills | Status: AC

## 2023-11-27 NOTE — Progress Notes (Unsigned)
 Chief Complaint  Patient presents with   Foot Pain    Underneath of L foot has blister filled w/ fluid and itching and has been spreading over the last 6 years.  It started out just under L big toe.  Also L 5 toe nail has what she thinks is fungus growing.  No pain but itches. Has tried athletes foot medication  Non Diabetic No blood thinners    HPI: 43 y.o. female presents today with concern of peeling and blistering of the plantar aspect of the left foot.  States that there is itching associated with this as well.  She states it has been going on for the past several years.  She states it began near the toe and then began spreading.  She is also concerned of possible nail fungus in the left fifth toenail.  She has toenail polish on which makes it difficult to evaluate the toenail for any abnormalities.  Past Medical History:  Diagnosis Date   Anxiety    Asthma    Asthma    Bipolar disorder (HCC)    Complication of anesthesia     I have a hard time waking & I cry    Depression    panic disorder   Depression    History of blood transfusion 08/2017   MRSA (methicillin resistant staph aureus) culture positive 2013   Neuropathy    left leg   Polysubstance abuse (HCC) 08/27/2020   Seizures (HCC)    last one 08/29/17   Vaginal Pap smear, abnormal    Past Surgical History:  Procedure Laterality Date   ADENOIDECTOMY     FACIAL LACERATIONS REPAIR  08/30/2017       FEMUR IM NAIL Left 08/30/2017   Procedure: INTRAMEDULLARY (IM) NAIL FEMORAL;  Surgeon: Kendal Franky SQUIBB, MD;  Location: [facility];  Service: Orthopedics;  Laterality: Left;   I & D EXTREMITY Left 08/30/2017   Procedure: IRRIGATION AND DEBRIDEMENT LEFT FEMUR AND TIBIA;  Surgeon: Kendal Franky SQUIBB, MD;  Location: [facility];  Service: Orthopedics;  Laterality: Left;   I & D EXTREMITY Left 10/25/2017   Procedure: Irrigation and debridment and closure of wound left leg;  Surgeon: Kendal Franky SQUIBB, MD;  Location: [facility];  Service: Orthopedics;   Laterality: Left;   I & D EXTREMITY Left 11/29/2017   Procedure: IRRIGATION AND DEBRIDEMENT EXTREMITY;  Surgeon: Kendal Franky SQUIBB, MD;  Location: [facility];  Service: Orthopedics;  Laterality: Left;   OPEN REDUCTION INTERNAL FIXATION (ORIF) DISTAL RADIAL FRACTURE Right 09/01/2017   Procedure: OPEN REDUCTION INTERNAL FIXATION (ORIF) DISTAL RADIAL FRACTURE;  Surgeon: Kendal Franky SQUIBB, MD;  Location: [facility];  Service: Orthopedics;  Laterality: Right;   ORIF CLAVICULAR FRACTURE Left 09/01/2017   Procedure: OPEN REDUCTION INTERNAL FIXATION (ORIF) CLAVICULAR FRACTURE;  Surgeon: Kendal Franky SQUIBB, MD;  Location: [facility];  Service: Orthopedics;  Laterality: Left;   ORIF PELVIC FRACTURE     OPEN REDUCTION INTERNAL FIXATION (ORIF) CLAVICULAR FRACTURE   ORIF PELVIC FRACTURE WITH PERCUTANEOUS SCREWS N/A 09/01/2017   Procedure: ORIF PELVIC FRACTURE WITH PERCUTANEOUS SCREWS;  Surgeon: Kendal Franky SQUIBB, MD;  Location: [facility];  Service: Orthopedics;  Laterality: N/A;   TIBIA IM NAIL INSERTION Left 08/30/2017   Procedure: INTRAMEDULLARY (IM) NAIL TIBIAL;  Surgeon: Kendal Franky SQUIBB, MD;  Location: [facility];  Service: Orthopedics;  Laterality: Left;   TIBIA IM NAIL INSERTION Left 11/14/2018   Procedure: EXCHANGE NAILING OF LEFT TIBIAL NONUNION;  Surgeon: Kendal Franky SQUIBB, MD;  Location: [facility];  Service: Orthopedics;  Laterality: Left;   TONSILLECTOMY     TUBAL LIGATION     Allergies  Allergen Reactions   Vancomycin  Hives and Itching    PATIENT HAS HAD A PCN REACTION WITH IMMEDIATE RASH, FACIAL/TONGUE/THROAT SWELLING, SOB, OR LIGHTHEADEDNESS WITH HYPOTENSION:  #  #  YES  #  #  HAS PT DEVELOPED SEVERE RASH INVOLVING MUCUS MEMBRANES or SKIN NECROSIS: #  #  YES  #  # Has patient had a PCN reaction that required hospitalization: #  #  #  YES  #  #  #  Was in hospital when happened in 2014  Has patient had a PCN reaction occurring within the last 10 years: #  #  #  YES  #  #     Azithromycin  Hives   Erythromycin Nausea And Vomiting and  Rash   Sudafed [Pseudoephedrine Hcl] Rash and Other (See Comments)    Makes heart race   Tramadol  Nausea And Vomiting    it makes me throw-up   Vancomycin  Rash    ALL MYCINS   Zithromax  [Azithromycin ] Nausea And Vomiting and Rash   Review of Systems  Skin:  Positive for itching and rash.     Physical Exam: Palpable pedal pulses noted.  Patient has peeling and flaking of the skin, as well as small vesicular formation throughout the entirety of the plantar aspect of the left foot.  The vesicles do not appear purulent in nature.  Interspaces are mostly spared.  There does appear to be some thickening and dystrophy to the left fifth toenail but cannot evaluate the coloration due to her present toenail polish.  There is no erythema periungual aspect of the toe.  Epicritic sensation is intact  Assessment/Plan of Care: 1. Tinea pedis of left foot   2. Fungal nail infection     Meds ordered this encounter  Medications   clotrimazole -betamethasone  (LOTRISONE ) cream    Sig: Apply 1 Application topically daily.    Dispense:  30 g    Refill:  0   Informed the patient that she most likely has vesicular tinea pedis but cannot completely rule out dyshidrotic eczema.  Will get her started on Lotrisone  cream to help with the itching immediately.  Patient encouraged not to scratch the skin or this could cause spreading of the fungal infection.  Due to the severity of the issue, and possible toenail involvement, I would recommend oral medication as well.  She was given an order for blood work/hepatic function panel.  Once we receive these results, if normal, will send in oral terbinafine to her pharmacy.  Will do a 6-week course and follow-up in 5 to 6 weeks for reevaluation.  She did note that eczema does run in her family.  Dyshidrotic eczema is slightly different, but will still keep this as a differential diagnosis moving forward depending on how she responds to this treatment.   Awanda CHARM Imperial,  DPM, FACFAS [facility]     [address] [place], [place] [address]                Office [phone]  Fax [phone]

## 2024-01-02 ENCOUNTER — Ambulatory Visit: Admitting: Podiatry
